# Patient Record
Sex: Female | Born: 1950
Health system: Southern US, Community
[De-identification: ages and names within clinical notes are randomized; demographics above are authoritative.]

## PROBLEM LIST (undated history)

## (undated) ENCOUNTER — Ambulatory Visit: Admission: EM

## (undated) DIAGNOSIS — M2550 Pain in unspecified joint: Secondary | ICD-10-CM

## (undated) DIAGNOSIS — M7072 Other bursitis of hip, left hip: Secondary | ICD-10-CM

## (undated) DIAGNOSIS — I10 Essential (primary) hypertension: Secondary | ICD-10-CM

## (undated) DIAGNOSIS — Z720 Tobacco use: Secondary | ICD-10-CM

## (undated) DIAGNOSIS — K219 Gastro-esophageal reflux disease without esophagitis: Secondary | ICD-10-CM

## (undated) DIAGNOSIS — E669 Obesity, unspecified: Secondary | ICD-10-CM

## (undated) DIAGNOSIS — J439 Emphysema, unspecified: Secondary | ICD-10-CM

## (undated) DIAGNOSIS — G4733 Obstructive sleep apnea (adult) (pediatric): Secondary | ICD-10-CM

## (undated) DIAGNOSIS — M8580 Other specified disorders of bone density and structure, unspecified site: Secondary | ICD-10-CM

## (undated) DIAGNOSIS — F32A Depression, unspecified: Secondary | ICD-10-CM

## (undated) DIAGNOSIS — N189 Chronic kidney disease, unspecified: Secondary | ICD-10-CM

## (undated) DIAGNOSIS — K5792 Diverticulitis of intestine, part unspecified, without perforation or abscess without bleeding: Secondary | ICD-10-CM

## (undated) DIAGNOSIS — F329 Major depressive disorder, single episode, unspecified: Secondary | ICD-10-CM

## (undated) DIAGNOSIS — IMO0001 Reserved for inherently not codable concepts without codable children: Secondary | ICD-10-CM

## (undated) DIAGNOSIS — R7303 Prediabetes: Secondary | ICD-10-CM

## (undated) DIAGNOSIS — M810 Age-related osteoporosis without current pathological fracture: Secondary | ICD-10-CM

## (undated) HISTORY — DX: Tobacco use: Z72.0

## (undated) HISTORY — DX: Obesity, unspecified: E66.9

## (undated) HISTORY — DX: Depression, unspecified: F32.A

## (undated) HISTORY — PX: ANAL FISSURE REPAIR: SHX2312

## (undated) HISTORY — PX: OOPHORECTOMY: SHX86

## (undated) HISTORY — DX: Diverticulitis of intestine, part unspecified, without perforation or abscess without bleeding: K57.92

## (undated) HISTORY — DX: Other bursitis of hip, left hip: M70.72

## (undated) HISTORY — DX: Gastro-esophageal reflux disease without esophagitis: K21.9

## (undated) HISTORY — PX: APPENDECTOMY: SHX54

## (undated) HISTORY — DX: Prediabetes: R73.03

## (undated) HISTORY — DX: Reserved for inherently not codable concepts without codable children: IMO0001

## (undated) HISTORY — DX: Obstructive sleep apnea (adult) (pediatric): G47.33

## (undated) HISTORY — DX: Pain in unspecified joint: M25.50

## (undated) HISTORY — DX: Essential (primary) hypertension: I10

## (undated) HISTORY — DX: Age-related osteoporosis without current pathological fracture: M81.0

## (undated) HISTORY — DX: Other specified disorders of bone density and structure, unspecified site: M85.80

## (undated) HISTORY — DX: Major depressive disorder, single episode, unspecified: F32.9

## (undated) HISTORY — DX: Emphysema, unspecified: J43.9

## (undated) SURGERY — Surgical Case
Anesthesia: *Unknown

---

## 2003-11-12 ENCOUNTER — Ambulatory Visit: Payer: Self-pay | Admitting: General Surgery

## 2006-09-12 ENCOUNTER — Emergency Department: Payer: Self-pay | Admitting: Emergency Medicine

## 2007-11-14 ENCOUNTER — Ambulatory Visit: Payer: Self-pay

## 2008-01-30 ENCOUNTER — Ambulatory Visit: Payer: Self-pay | Admitting: General Surgery

## 2008-01-30 LAB — HM COLONOSCOPY

## 2008-02-14 ENCOUNTER — Ambulatory Visit: Payer: Self-pay

## 2011-11-16 LAB — HM PAP SMEAR

## 2011-12-13 ENCOUNTER — Ambulatory Visit: Payer: Self-pay

## 2011-12-27 ENCOUNTER — Ambulatory Visit: Payer: Self-pay

## 2012-12-27 ENCOUNTER — Ambulatory Visit: Payer: Self-pay

## 2013-01-10 ENCOUNTER — Ambulatory Visit: Payer: Self-pay

## 2013-02-01 ENCOUNTER — Ambulatory Visit: Payer: Self-pay | Admitting: Family Medicine

## 2013-12-28 ENCOUNTER — Ambulatory Visit: Payer: Self-pay

## 2013-12-28 LAB — HM MAMMOGRAPHY

## 2014-08-06 DIAGNOSIS — Z720 Tobacco use: Secondary | ICD-10-CM | POA: Insufficient documentation

## 2014-08-06 DIAGNOSIS — K219 Gastro-esophageal reflux disease without esophagitis: Secondary | ICD-10-CM

## 2014-08-06 DIAGNOSIS — F32A Depression, unspecified: Secondary | ICD-10-CM | POA: Insufficient documentation

## 2014-08-06 DIAGNOSIS — M7072 Other bursitis of hip, left hip: Secondary | ICD-10-CM | POA: Insufficient documentation

## 2014-08-06 DIAGNOSIS — F329 Major depressive disorder, single episode, unspecified: Secondary | ICD-10-CM | POA: Insufficient documentation

## 2014-08-06 DIAGNOSIS — K5792 Diverticulitis of intestine, part unspecified, without perforation or abscess without bleeding: Secondary | ICD-10-CM | POA: Insufficient documentation

## 2014-08-06 DIAGNOSIS — M810 Age-related osteoporosis without current pathological fracture: Secondary | ICD-10-CM | POA: Insufficient documentation

## 2014-08-07 ENCOUNTER — Encounter: Payer: Self-pay | Admitting: Unknown Physician Specialty

## 2014-08-07 ENCOUNTER — Ambulatory Visit
Admission: RE | Admit: 2014-08-07 | Discharge: 2014-08-07 | Disposition: A | Discharge status: Home / Self Care | Payer: BC Managed Care – PPO | Source: Ambulatory Visit | Attending: Unknown Physician Specialty | Admitting: Unknown Physician Specialty

## 2014-08-07 ENCOUNTER — Ambulatory Visit (INDEPENDENT_AMBULATORY_CARE_PROVIDER_SITE_OTHER): Payer: BC Managed Care – PPO | Admitting: Unknown Physician Specialty

## 2014-08-07 VITALS — BP 137/80 | HR 69 | Temp 98.2°F | Ht 65.3 in | Wt 213.4 lb

## 2014-08-07 DIAGNOSIS — D170 Benign lipomatous neoplasm of skin and subcutaneous tissue of head, face and neck: Secondary | ICD-10-CM | POA: Diagnosis present

## 2014-08-07 DIAGNOSIS — T148 Other injury of unspecified body region: Secondary | ICD-10-CM | POA: Diagnosis not present

## 2014-08-07 DIAGNOSIS — T148XXA Other injury of unspecified body region, initial encounter: Secondary | ICD-10-CM

## 2014-08-07 MED ORDER — CYCLOBENZAPRINE HCL 10 MG PO TABS: 10.0000 mg | ORAL_TABLET | Freq: Three times a day (TID) | ORAL | Status: DC | PRN

## 2014-08-07 NOTE — Assessment & Plan Note (Addendum)
This feels to be a lipoma, but due to location further imaging is warranted.  Discussed with Dr. Sanda Klein, will order Ultrasound of the area to confirm.  Will get Chest x-ray as she is a smoker.

## 2014-08-07 NOTE — Progress Notes (Signed)
   BP 137/80 mmHg  Pulse 69  Temp(Src) 98.2 F (36.8 C)  Ht 5' 5.3" (1.659 m)  Wt 213 lb 6.4 oz (96.798 kg)  BMI 35.17 kg/m2  SpO2 100%  LMP  (LMP Unknown)   Subjective:    Patient ID: Allison Phillips, female    DOB: 1950/11/18, 64 y.o.   MRN: [id number]  HPI: Allison Phillips is a 64 y.o. female  Chief Complaint  Patient presents with  . Shoulder Pain    patient states she thinks she may have pulled a muscle in her shoulder about a week ago   Shoulder Pain  The pain is present in the neck and back. This is a new (as she was putting stuff on it, concerned about a lump ) problem. Episode onset: 1 1/2 weeks ago after lifting a heavy pan. The problem has been gradually improving. The quality of the pain is described as aching.  Pt states the muscle pain is getting better.  She is more concerned about the lump.    Relevant past medical, surgical, family and social history reviewed and updated as indicated. Interim medical history since our last visit reviewed. Allergies and medications reviewed and updated.  Review of Systems  Per HPI unless specifically indicated above     Objective:    BP 137/80 mmHg  Pulse 69  Temp(Src) 98.2 F (36.8 C)  Ht 5' 5.3" (1.659 m)  Wt 213 lb 6.4 oz (96.798 kg)  BMI 35.17 kg/m2  SpO2 100%  LMP  (LMP Unknown)  Wt Readings from Last 3 Encounters:  08/07/14 213 lb 6.4 oz (96.798 kg)  01/14/14 212 lb (96.163 kg)    Physical Exam  Constitutional: She is oriented to person, place, and time. She appears well-developed and well-nourished. No distress.  HENT:  Head: Normocephalic and atraumatic.  Eyes: Conjunctivae and lids are normal. Right eye exhibits no discharge. Left eye exhibits no discharge. No scleral icterus.  Pulmonary/Chest: Effort normal. No respiratory distress.  Abdominal: Normal appearance. There is no splenomegaly or hepatomegaly.  Musculoskeletal: Normal range of motion.  Noted soft 3-4 cm mass right clavicular area   Neurological: She is alert and oriented to person, place, and time.  Skin: Skin is intact. No rash noted. No pallor.  Psychiatric: She has a normal mood and affect. Her behavior is normal. Judgment and thought content normal.    No results found for this or any previous visit.    Assessment & Plan:   Problem List Items Addressed This Visit      Musculoskeletal and Integument   Lipoma of skin and subcutaneous tissue of neck - Primary    This feels to be a lipoma, but due to location further imaging is warranted.  Discussed with Dr. Sanda Klein, will order Ultrasound of the area to confirm.  Will get Chest x-ray as she is a smoker.        Relevant Orders   DG Chest 2 View   US Soft Tissue Head/Neck    Other Visit Diagnoses    Pulled muscle        Will rx Flexeril 10 mg q 8 hours prn        Follow up plan: Return if symptoms worsen or fail to improve, for results.

## 2014-08-09 ENCOUNTER — Ambulatory Visit
Admission: RE | Admit: 2014-08-09 | Discharge: 2014-08-09 | Disposition: A | Discharge status: Home / Self Care | Payer: BC Managed Care – PPO | Source: Ambulatory Visit | Attending: Unknown Physician Specialty | Admitting: Unknown Physician Specialty

## 2014-08-09 DIAGNOSIS — D170 Benign lipomatous neoplasm of skin and subcutaneous tissue of head, face and neck: Secondary | ICD-10-CM | POA: Diagnosis present

## 2014-11-25 ENCOUNTER — Encounter: Payer: Self-pay | Admitting: Unknown Physician Specialty

## 2014-11-25 ENCOUNTER — Ambulatory Visit (INDEPENDENT_AMBULATORY_CARE_PROVIDER_SITE_OTHER): Payer: BC Managed Care – PPO | Admitting: Unknown Physician Specialty

## 2014-11-25 VITALS — BP 136/82 | HR 86 | Temp 98.6°F | Ht 65.5 in | Wt 209.9 lb

## 2014-11-25 DIAGNOSIS — Z0000 Encounter for general adult medical examination without abnormal findings: Secondary | ICD-10-CM

## 2014-11-25 DIAGNOSIS — J40 Bronchitis, not specified as acute or chronic: Secondary | ICD-10-CM | POA: Diagnosis not present

## 2014-11-25 DIAGNOSIS — Z720 Tobacco use: Secondary | ICD-10-CM

## 2014-11-25 MED ORDER — AZITHROMYCIN 250 MG PO TABS: ORAL_TABLET | ORAL | Status: DC

## 2014-11-25 NOTE — Progress Notes (Signed)
BP 136/82 mmHg  Pulse 86  Temp(Src) 98.6 F (37 C)  Ht 5' 5.5" (1.664 m)  Wt 209 lb 14.4 oz (95.21 kg)  BMI 34.39 kg/m2  SpO2 97%  LMP  (LMP Unknown)   Subjective:    Patient ID: Allison Phillips, female    DOB: September 12, 1950, 64 y.o.   MRN: [id number]  HPI: Allison Phillips is a 64 y.o. female  Chief Complaint  Patient presents with  . Annual Exam  . Cough    pt states she has had a cough since last Tuesday night (11/19/14). States she has been coughing up "thick green stuff". States she tried Mucinex but it did not help   Cough This is a new problem. The current episode started in the past 7 days. The problem has been unchanged. The cough is productive of purulent sputum. Associated symptoms include wheezing. Pertinent negatives include no chest pain, chills, ear congestion, ear pain, fever, headaches, heartburn, hemoptysis, myalgias, nasal congestion, postnasal drip, rash, rhinorrhea, sore throat, shortness of breath, sweats or weight loss. Exacerbated by: worse at nigh\ Risk factors for lung disease include smoking/tobacco exposure. She has tried nothing for the symptoms.     Relevant past medical, surgical, family and social history reviewed and updated as indicated. Interim medical history since our last visit reviewed. Allergies and medications reviewed and updated.  Review of Systems  Constitutional: Negative for fever, chills and weight loss.  HENT: Negative for ear pain, postnasal drip, rhinorrhea and sore throat.   Respiratory: Positive for cough and wheezing. Negative for hemoptysis and shortness of breath.   Cardiovascular: Negative for chest pain.  Gastrointestinal: Negative for heartburn.  Musculoskeletal: Negative for myalgias.  Skin: Negative for rash.  Neurological: Negative for headaches.    Per HPI unless specifically indicated above     Objective:    BP 136/82 mmHg  Pulse 86  Temp(Src) 98.6 F (37 C)  Ht 5' 5.5" (1.664 m)  Wt 209 lb 14.4 oz  (95.21 kg)  BMI 34.39 kg/m2  SpO2 97%  LMP  (LMP Unknown)  Wt Readings from Last 3 Encounters:  11/25/14 209 lb 14.4 oz (95.21 kg)  08/07/14 213 lb 6.4 oz (96.798 kg)  01/14/14 212 lb (96.163 kg)    Physical Exam  Constitutional: She is oriented to person, place, and time. She appears well-developed and well-nourished.  HENT:  Head: Normocephalic and atraumatic.  Eyes: Pupils are equal, round, and reactive to light. Right eye exhibits no discharge. Left eye exhibits no discharge. No scleral icterus.  Neck: Normal range of motion. Neck supple. Carotid bruit is not present. No thyromegaly present.  Cardiovascular: Normal rate, regular rhythm and normal heart sounds.  Exam reveals no gallop and no friction rub.   No murmur heard. Pulmonary/Chest: Effort normal and breath sounds normal. No respiratory distress. She has no wheezes. She has no rales.  Abdominal: Soft. Bowel sounds are normal. There is no tenderness. There is no rebound.  Genitourinary: Vagina normal and uterus normal. No breast swelling, tenderness or discharge. Cervix exhibits no motion tenderness, no discharge and no friability. Right adnexum displays no mass, no tenderness and no fullness. Left adnexum displays no mass, no tenderness and no fullness.  Musculoskeletal: Normal range of motion.  Lymphadenopathy:    She has no cervical adenopathy.  Neurological: She is alert and oriented to person, place, and time.  Skin: Skin is warm, dry and intact. No rash noted.  Psychiatric: She has a normal mood and affect.  Her speech is normal and behavior is normal. Judgment and thought content normal. Cognition and memory are normal.  Nursing note and vitals reviewed.     Assessment & Plan:   Problem List Items Addressed This Visit    None    Visit Diagnoses    Annual physical exam    -  Primary    Relevant Orders    CBC with Differential/Platelet    Comprehensive metabolic panel    Hepatitis C antibody    HIV antibody     IGP, Aptima HPV, rfx 16/18,45    TSH    Lipid Panel w/o Chol/HDL Ratio    MM DIGITAL SCREENING BILATERAL    Ambulatory referral to General Surgery    Tobacco abuse        Bronchitis         Rx for Zithromax for Bronchitis   Follow up plan: Return in about 1 year (around 11/25/2015).

## 2014-11-26 ENCOUNTER — Encounter: Payer: Self-pay | Admitting: Unknown Physician Specialty

## 2014-11-26 LAB — COMPREHENSIVE METABOLIC PANEL
A/G RATIO: 1.7 (ref 1.1–2.5)
ALT: 14 IU/L (ref 0–32)
AST: 21 IU/L (ref 0–40)
Albumin: 4.5 g/dL (ref 3.6–4.8)
Alkaline Phosphatase: 58 IU/L (ref 39–117)
BUN/Creatinine Ratio: 14 (ref 11–26)
BUN: 12 mg/dL (ref 8–27)
Bilirubin Total: 0.4 mg/dL (ref 0.0–1.2)
CALCIUM: 9.5 mg/dL (ref 8.7–10.3)
CO2: 26 mmol/L (ref 18–29)
Chloride: 100 mmol/L (ref 97–106)
Creatinine, Ser: 0.86 mg/dL (ref 0.57–1.00)
GFR calc Af Amer: 83 mL/min/{1.73_m2} (ref >59)
GFR, EST NON AFRICAN AMERICAN: 72 mL/min/{1.73_m2} (ref >59)
Globulin, Total: 2.6 g/dL (ref 1.5–4.5)
Glucose: 109 mg/dL — ABNORMAL HIGH (ref 65–99)
POTASSIUM: 4.2 mmol/L (ref 3.5–5.2)
Sodium: 141 mmol/L (ref 136–144)
Total Protein: 7.1 g/dL (ref 6.0–8.5)

## 2014-11-26 LAB — CBC WITH DIFFERENTIAL/PLATELET
Basophils Absolute: 0.1 10*3/uL (ref 0.0–0.2)
Basos: 1 %
EOS (ABSOLUTE): 0.2 10*3/uL (ref 0.0–0.4)
Eos: 4 %
Hematocrit: 42.2 % (ref 34.0–46.6)
Hemoglobin: 13.7 g/dL (ref 11.1–15.9)
IMMATURE GRANULOCYTES: 0 %
Immature Grans (Abs): 0 10*3/uL (ref 0.0–0.1)
Lymphocytes Absolute: 2.7 10*3/uL (ref 0.7–3.1)
Lymphs: 44 %
MCH: 28.3 pg (ref 26.6–33.0)
MCHC: 32.5 g/dL (ref 31.5–35.7)
MCV: 87 fL (ref 79–97)
MONOS ABS: 0.3 10*3/uL (ref 0.1–0.9)
Monocytes: 6 %
NEUTROS PCT: 45 %
Neutrophils Absolute: 2.7 10*3/uL (ref 1.4–7.0)
PLATELETS: 307 10*3/uL (ref 150–379)
RBC: 4.84 x10E6/uL (ref 3.77–5.28)
RDW: 14.1 % (ref 12.3–15.4)
WBC: 6 10*3/uL (ref 3.4–10.8)

## 2014-11-26 LAB — TSH: TSH: 0.75 u[IU]/mL (ref 0.450–4.500)

## 2014-11-26 LAB — LIPID PANEL W/O CHOL/HDL RATIO
Cholesterol, Total: 152 mg/dL (ref 100–199)
HDL: 63 mg/dL (ref >39)
LDL Calculated: 58 mg/dL (ref 0–99)
Triglycerides: 154 mg/dL — ABNORMAL HIGH (ref 0–149)
VLDL Cholesterol Cal: 31 mg/dL (ref 5–40)

## 2014-11-26 LAB — HIV ANTIBODY (ROUTINE TESTING W REFLEX): HIV SCREEN 4TH GENERATION: NONREACTIVE

## 2014-11-26 LAB — HEPATITIS C ANTIBODY

## 2014-11-26 NOTE — Progress Notes (Signed)
Quick Note:  Normal labs. Patient notified by letter. ______ 

## 2014-11-30 LAB — IGP, APTIMA HPV, RFX 16/18,45
HPV APTIMA: NEGATIVE
PAP SMEAR COMMENT: 0

## 2014-12-05 ENCOUNTER — Encounter: Payer: Self-pay | Admitting: General Surgery

## 2014-12-05 ENCOUNTER — Ambulatory Visit (INDEPENDENT_AMBULATORY_CARE_PROVIDER_SITE_OTHER): Payer: BC Managed Care – PPO | Admitting: General Surgery

## 2014-12-05 VITALS — BP 118/68 | HR 74 | Resp 12 | Ht 66.0 in | Wt 219.0 lb

## 2014-12-05 DIAGNOSIS — K5790 Diverticulosis of intestine, part unspecified, without perforation or abscess without bleeding: Secondary | ICD-10-CM

## 2014-12-05 DIAGNOSIS — Z86010 Personal history of colonic polyps: Secondary | ICD-10-CM

## 2014-12-05 DIAGNOSIS — Z1211 Encounter for screening for malignant neoplasm of colon: Secondary | ICD-10-CM

## 2014-12-05 MED ORDER — POLYETHYLENE GLYCOL 3350 17 GM/SCOOP PO POWD: ORAL | Status: DC

## 2014-12-05 NOTE — Progress Notes (Signed)
Patient ID: Allison Phillips, female   DOB: Oct 31, 1950, 64 y.o.   MRN: [id number]  Chief Complaint  Patient presents with  . Colonoscopy    HPI Allison Phillips is a 64 y.o. female here today for an evaluation for a screening colonoscopy. Last one was 01/28/2008. Patient states no GI problems at this time.  No new health issues. Recent labs were normal. I have reviewed the history of present illness with the patient. HPI  Past Medical History  Diagnosis Date  . Reflux   . Depression   . Osteoporosis   . Diverticulitis   . Tobacco use   . Bursitis of left hip   . Obesity     Past Surgical History  Procedure Laterality Date  . Anal fissure repair    . Oophorectomy      for "fibroids" not sure which ovary  . Cesarean section  1978  . Appendectomy      Family History  Problem Relation Age of Onset  . Hypertension Mother   . Kidney disease Mother   . Alzheimer's disease Mother     Social History Social History  Substance Use Topics  . Smoking status: Current Every Day Smoker -- 0.50 packs/day    Types: Cigarettes  . Smokeless tobacco: Never Used  . Alcohol Use: Yes     Comment: on occasion    No Known Allergies  Current Outpatient Prescriptions  Medication Sig Dispense Refill  . Fish Oil-Cholecalciferol (FISH OIL + D3) 1000-1000 MG-UNIT CAPS Take by mouth.    . Multiple Vitamin (MULTIVITAMIN) tablet Take 1 tablet by mouth daily.    . vitamin B-12 (CYANOCOBALAMIN) 250 MCG tablet Take 250 mcg by mouth daily.    . vitamin E 200 UNIT capsule Take 200 Units by mouth daily.    . polyethylene glycol powder (GLYCOLAX/MIRALAX) powder 255 grams one bottle for colonoscopy prep 255 g 0   No current facility-administered medications for this visit.    Review of Systems Review of Systems  Constitutional: Negative.   Respiratory: Negative.   Cardiovascular: Negative.     Blood pressure 118/68, pulse 74, resp. rate 12, height 5\' 6"  (1.676 m), weight 219 lb (99.338  kg).  Physical Exam Physical Exam  Constitutional: She is oriented to person, place, and time. She appears well-developed and well-nourished.  Eyes: Conjunctivae are normal. No scleral icterus.  Neck: Neck supple.  Cardiovascular: Normal rate, regular rhythm and normal heart sounds.   Pulmonary/Chest: Effort normal and breath sounds normal.    Abdominal: Soft. Normal appearance and bowel sounds are normal. She exhibits no distension. There is no hepatomegaly. There is no tenderness.  Lymphadenopathy:    She has no cervical adenopathy.  Neurological: She is alert and oriented to person, place, and time.  Skin: Skin is dry.    Data Reviewed Notes and last colonoscopy reviewed  Assessment    Stable exam, history of colon polyps and diverticulosis. Due for repeat colonoscopy. She also has a small lipoma over right clavicular region, discussed options with patient (elective excision vs observation) and patient agreeable to observation at this time.     Plan       Colonoscopy with possible biopsy/polypectomy prn: Information regarding the procedure, including its potential risks and complications (including but not limited to perforation of the bowel, which may require emergency surgery to repair, and bleeding) was verbally given to the patient. Educational information regarding lower intestinal endoscopy was given to the patient. Written instructions for how to  complete the bowel prep using Miralax were provided. The importance of drinking ample fluids to avoid dehydration as a result of the prep emphasized.  Patient has been scheduled for a colonoscopy on 12-18-14 at [facility]. This patient has been asked to discontinue fish oil one week prior to procedure.   PCP:  Robbi Garter 12/05/2014, 10:32 AM

## 2014-12-05 NOTE — Addendum Note (Signed)
Addended by: Christene Lye on: 12/05/2014 10:35 AM   Modules accepted: Orders

## 2014-12-05 NOTE — Patient Instructions (Addendum)
Colonoscopy A colonoscopy is an exam to look at the entire large intestine (colon). This exam can help find problems such as tumors, polyps, inflammation, and areas of bleeding. The exam takes about 1 hour.  LET [facility] CARE PROVIDER KNOW ABOUT:   Any allergies you have.  All medicines you are taking, including vitamins, herbs, eye drops, creams, and over-the-counter medicines.  Previous problems you or members of your family have had with the use of anesthetics.  Any blood disorders you have.  Previous surgeries you have had.  Medical conditions you have. RISKS AND COMPLICATIONS  Generally, this is a safe procedure. However, as with any procedure, complications can occur. Possible complications include:  Bleeding.  Tearing or rupture of the colon wall.  Reaction to medicines given during the exam.  Infection (rare). BEFORE THE PROCEDURE   Ask your health care provider about changing or stopping your regular medicines.  You may be prescribed an oral bowel prep. This involves drinking a large amount of medicated liquid, starting the day before your procedure. The liquid will cause you to have multiple loose stools until your stool is almost clear or light green. This cleans out your colon in preparation for the procedure.  Do not eat or drink anything else once you have started the bowel prep, unless your health care provider tells you it is safe to do so.  Arrange for someone to drive you home after the procedure. PROCEDURE   You will be given medicine to help you relax (sedative).  You will lie on your side with your knees bent.  A long, flexible tube with a light and camera on the end (colonoscope) will be inserted through the rectum and into the colon. The camera sends video back to a computer screen as it moves through the colon. The colonoscope also releases carbon dioxide gas to inflate the colon. This helps your health care provider see the area better.  During  the exam, your health care provider may take a small tissue sample (biopsy) to be examined under a microscope if any abnormalities are found.  The exam is finished when the entire colon has been viewed. AFTER THE PROCEDURE   Do not drive for 24 hours after the exam.  You may have a small amount of blood in your stool.  You may pass moderate amounts of gas and have mild abdominal cramping or bloating. This is caused by the gas used to inflate your colon during the exam.  Ask when your test results will be ready and how you will get your results. Make sure you get your test results.   This information is not intended to replace advice given to you by your health care provider. Make sure you discuss any questions you have with your health care provider.   Document Released: 01/02/2000 Document Revised: 10/25/2012 Document Reviewed: 09/11/2012 Elsevier Interactive Patient Education [employer].  Patient has been scheduled for a colonoscopy on 12-18-14 at [facility]. This patient has been asked to discontinue fish oil one week prior to procedure.

## 2014-12-17 ENCOUNTER — Ambulatory Visit: Payer: BC Managed Care – PPO | Admitting: Unknown Physician Specialty

## 2014-12-18 ENCOUNTER — Encounter: Payer: Self-pay | Admitting: Anesthesiology

## 2014-12-18 ENCOUNTER — Ambulatory Visit: Payer: BC Managed Care – PPO | Admitting: Anesthesiology

## 2014-12-18 ENCOUNTER — Encounter
Admission: RE | Disposition: A | Discharge status: Home / Self Care | Payer: Self-pay | Source: Ambulatory Visit | Attending: General Surgery

## 2014-12-18 ENCOUNTER — Ambulatory Visit
Admission: RE | Admit: 2014-12-18 | Discharge: 2014-12-18 | Disposition: A | Discharge status: Home / Self Care | Payer: BC Managed Care – PPO | Source: Ambulatory Visit | Attending: General Surgery | Admitting: General Surgery

## 2014-12-18 DIAGNOSIS — F329 Major depressive disorder, single episode, unspecified: Secondary | ICD-10-CM | POA: Diagnosis not present

## 2014-12-18 DIAGNOSIS — Z841 Family history of disorders of kidney and ureter: Secondary | ICD-10-CM | POA: Diagnosis not present

## 2014-12-18 DIAGNOSIS — Z86010 Personal history of colonic polyps: Secondary | ICD-10-CM | POA: Insufficient documentation

## 2014-12-18 DIAGNOSIS — E669 Obesity, unspecified: Secondary | ICD-10-CM | POA: Insufficient documentation

## 2014-12-18 DIAGNOSIS — Z820 Family history of epilepsy and other diseases of the nervous system: Secondary | ICD-10-CM | POA: Insufficient documentation

## 2014-12-18 DIAGNOSIS — F17210 Nicotine dependence, cigarettes, uncomplicated: Secondary | ICD-10-CM | POA: Insufficient documentation

## 2014-12-18 DIAGNOSIS — Z79899 Other long term (current) drug therapy: Secondary | ICD-10-CM | POA: Insufficient documentation

## 2014-12-18 DIAGNOSIS — K5730 Diverticulosis of large intestine without perforation or abscess without bleeding: Secondary | ICD-10-CM | POA: Diagnosis not present

## 2014-12-18 DIAGNOSIS — G4730 Sleep apnea, unspecified: Secondary | ICD-10-CM | POA: Insufficient documentation

## 2014-12-18 DIAGNOSIS — M810 Age-related osteoporosis without current pathological fracture: Secondary | ICD-10-CM | POA: Insufficient documentation

## 2014-12-18 DIAGNOSIS — K219 Gastro-esophageal reflux disease without esophagitis: Secondary | ICD-10-CM | POA: Diagnosis not present

## 2014-12-18 DIAGNOSIS — K621 Rectal polyp: Secondary | ICD-10-CM | POA: Diagnosis not present

## 2014-12-18 DIAGNOSIS — Z8249 Family history of ischemic heart disease and other diseases of the circulatory system: Secondary | ICD-10-CM | POA: Insufficient documentation

## 2014-12-18 HISTORY — PX: COLONOSCOPY WITH PROPOFOL: SHX5780

## 2014-12-18 SURGERY — COLONOSCOPY WITH PROPOFOL
Anesthesia: General

## 2014-12-18 MED ORDER — SODIUM CHLORIDE 0.9 % IV SOLN
INTRAVENOUS | Status: DC
  Administered 2014-12-18: 09:00:00 via INTRAVENOUS

## 2014-12-18 MED ORDER — FENTANYL CITRATE (PF) 100 MCG/2ML IJ SOLN
INTRAMUSCULAR | Status: DC | PRN
  Administered 2014-12-18: 50 ug via INTRAVENOUS

## 2014-12-18 MED ORDER — PROPOFOL 10 MG/ML IV BOLUS
INTRAVENOUS | Status: DC | PRN
  Administered 2014-12-18: 50 mg via INTRAVENOUS

## 2014-12-18 MED ORDER — MIDAZOLAM HCL 2 MG/2ML IJ SOLN
INTRAMUSCULAR | Status: DC | PRN
  Administered 2014-12-18: 1 mg via INTRAVENOUS

## 2014-12-18 MED ORDER — PROPOFOL 500 MG/50ML IV EMUL
INTRAVENOUS | Status: DC | PRN
  Administered 2014-12-18: 125 ug/kg/min via INTRAVENOUS

## 2014-12-18 NOTE — Transfer of Care (Signed)
Immediate Anesthesia Transfer of Care Note  Patient: Allison Phillips  Procedure(s) Performed: Procedure(s): COLONOSCOPY WITH PROPOFOL (N/A)  Patient Location: PACU  Anesthesia Type:General  Level of Consciousness: sedated  Airway & Oxygen Therapy: Patient Spontanous Breathing and Patient connected to nasal cannula oxygen  Post-op Assessment: Report given to RN and Post -op Vital signs reviewed and stable  Post vital signs: Reviewed and stable  Last Vitals:  Filed Vitals:   12/18/14 0915  BP: 138/75  Pulse: 62  Temp: 35.9 C  Resp: 17    Complications: No apparent anesthesia complications

## 2014-12-18 NOTE — Op Note (Signed)
[facility] Patient Name: Allison Phillips Procedure Date: 12/18/2014 9:38 AM MRN: [id number] Account #: [id number] Date of Birth: 07-06-1950 Admit Type: Outpatient Age: 64 Room: [facility] ENDO ROOM 1 Gender: Female Note Status: Finalized Procedure:         Colonoscopy Indications:       High risk colon cancer surveillance: Personal history of                     colonic polyps Providers:         Kashae Carstens G. Jamal Collin, MD Referring MD:      Forest Gleason Md, MD (Referring MD) Medicines:         General Anesthesia Complications:     No immediate complications. Procedure:         Pre-Anesthesia Assessment:                    - General anesthesia under the supervision of an                     anesthesiologist was determined to be medically necessary                     for this procedure based on review of the patient's                     medical history, medications, and prior anesthesia history.                    After obtaining informed consent, the colonoscope was                     passed under direct vision. Throughout the procedure, the                     patient's blood pressure, pulse, and oxygen saturations                     were monitored continuously. The Colonoscope was                     introduced through the anus and advanced to the the cecum,                     identified by the ileocecal valve. The colonoscopy was                     performed without difficulty. The patient tolerated the                     procedure well. The quality of the bowel preparation was                     good. Findings:      The perianal and digital rectal examinations were normal.      Multiple small and large-mouthed diverticula were found in the sigmoid       colon.      Many sessile polyps were found in the rectum. The polyps were 2 to 3 mm       in size. Biopsies were taken with a cold forceps for histology.      The exam was otherwise without  abnormality. Impression:        - Diverticulosis in the sigmoid colon.                    -  Many benign appearing 2 to 3 mm polyps in the rectum.                     Biopsied.                    - The examination was otherwise normal. Recommendation:    - Repeat colonoscopy in 5 years for surveillance. Procedure Code(s): --- Professional ---                    [phone], Colonoscopy, flexible; with biopsy, single or                     multiple Diagnosis Code(s): --- Professional ---                    Z86.010, Personal history of colonic polyps                    K62.1, Rectal polyp                    K57.30, Diverticulosis of large intestine without                     perforation or abscess without bleeding CPT copyright 2014 American Medical Association. All rights reserved. The codes documented in this report are preliminary and upon coder review may  be revised to meet current compliance requirements. Christene Lye, MD 12/18/2014 10:06:42 AM This report has been signed electronically. Number of Addenda: 0 Note Initiated On: 12/18/2014 9:38 AM Scope Withdrawal Time: 0 hours 6 minutes 25 seconds  Total Procedure Duration: 0 hours 19 minutes 25 seconds       [facility]

## 2014-12-18 NOTE — Anesthesia Procedure Notes (Signed)
Date/Time: 12/18/2014 9:49 AM Performed by: Nelda Marseille Pre-anesthesia Checklist: Patient identified, Emergency Drugs available, Suction available, Patient being monitored and Timeout performed Oxygen Delivery Method: Nasal cannula

## 2014-12-18 NOTE — Interval H&P Note (Signed)
History and Physical Interval Note:  12/18/2014 9:26 AM  Allison Phillips  has presented today for surgery, with the diagnosis of HX COLON POLYPS  The various methods of treatment have been discussed with the patient and family. After consideration of risks, benefits and other options for treatment, the patient has consented to  Procedure(s): COLONOSCOPY WITH PROPOFOL (N/A) as a surgical intervention .  The patient's history has been reviewed, patient examined, no change in status, stable for surgery.  I have reviewed the patient's chart and labs.  Questions were answered to the patient's satisfaction.     SANKAR,SEEPLAPUTHUR G

## 2014-12-18 NOTE — Anesthesia Preprocedure Evaluation (Addendum)
Anesthesia Evaluation  Patient identified by MRN, date of birth, ID band Patient awake    Reviewed: Allergy & Precautions, NPO status , Patient's Chart, lab work & pertinent test results, reviewed documented beta blocker date and time   Airway Mallampati: II  TM Distance: >3 FB     Dental  (+) Chipped   Pulmonary sleep apnea , Current Smoker,           Cardiovascular      Neuro/Psych Depression    GI/Hepatic   Endo/Other    Renal/GU      Musculoskeletal   Abdominal   Peds  Hematology   Anesthesia Other Findings Has sleep apnea - no CPAP.Obese.  Reproductive/Obstetrics                            Anesthesia Physical Anesthesia Plan  ASA: II  Anesthesia Plan: General   Post-op Pain Management:    Induction: Intravenous  Airway Management Planned: Nasal Cannula  Additional Equipment:   Intra-op Plan:   Post-operative Plan:   Informed Consent: I have reviewed the patients History and Physical, chart, labs and discussed the procedure including the risks, benefits and alternatives for the proposed anesthesia with the patient or authorized representative who has indicated his/her understanding and acceptance.     Plan Discussed with: CRNA  Anesthesia Plan Comments:         Anesthesia Quick Evaluation

## 2014-12-18 NOTE — Anesthesia Postprocedure Evaluation (Signed)
Anesthesia Post Note  Patient: Allison Phillips  Procedure(s) Performed: Procedure(s) (LRB): COLONOSCOPY WITH PROPOFOL (N/A)  Patient location during evaluation: PACU Anesthesia Type: General Level of consciousness: awake Pain management: pain level controlled Vital Signs Assessment: post-procedure vital signs reviewed and stable Respiratory status: spontaneous breathing Cardiovascular status: blood pressure returned to baseline Anesthetic complications: no    Last Vitals:  Filed Vitals:   12/18/14 1028 12/18/14 1038  BP: 130/78 134/77  Pulse: 58 52  Temp:    Resp: 16 15    Last Pain: There were no vitals filed for this visit.               [name] S

## 2014-12-18 NOTE — H&P (View-Only) (Signed)
Patient ID: Allison Phillips, female   DOB: [birth year], 64 y.o.   MRN: [id number]  Chief Complaint  Patient presents with  . Colonoscopy    HPI Allison Phillips is a 64 y.o. female here today for an evaluation for a screening colonoscopy. Last one was 01/28/2008. Patient states no GI problems at this time.  No new health issues. Recent labs were normal. I have reviewed the history of present illness with the patient. HPI  Past Medical History  Diagnosis Date  . Reflux   . Depression   . Osteoporosis   . Diverticulitis   . Tobacco use   . Bursitis of left hip   . Obesity     Past Surgical History  Procedure Laterality Date  . Anal fissure repair    . Oophorectomy      for "fibroids" not sure which ovary  . Cesarean section  1978  . Appendectomy      Family History  Problem Relation Age of Onset  . Hypertension Mother   . Kidney disease Mother   . Alzheimer's disease Mother     Social History Social History  Substance Use Topics  . Smoking status: Current Every Day Smoker -- 0.50 packs/day    Types: Cigarettes  . Smokeless tobacco: Never Used  . Alcohol Use: Yes     Comment: on occasion    No Known Allergies  Current Outpatient Prescriptions  Medication Sig Dispense Refill  . Fish Oil-Cholecalciferol (FISH OIL + D3) 1000-1000 MG-UNIT CAPS Take by mouth.    . Multiple Vitamin (MULTIVITAMIN) tablet Take 1 tablet by mouth daily.    . vitamin B-12 (CYANOCOBALAMIN) 250 MCG tablet Take 250 mcg by mouth daily.    . vitamin E 200 UNIT capsule Take 200 Units by mouth daily.    . polyethylene glycol powder (GLYCOLAX/MIRALAX) powder 255 grams one bottle for colonoscopy prep 255 g 0   No current facility-administered medications for this visit.    Review of Systems Review of Systems  Constitutional: Negative.   Respiratory: Negative.   Cardiovascular: Negative.     Blood pressure 118/68, pulse 74, resp. rate 12, height 5\' 6"  (1.676 m), weight 219 lb (99.338  kg).  Physical Exam Physical Exam  Constitutional: She is oriented to person, place, and time. She appears well-developed and well-nourished.  Eyes: Conjunctivae are normal. No scleral icterus.  Neck: Neck supple.  Cardiovascular: Normal rate, regular rhythm and normal heart sounds.   Pulmonary/Chest: Effort normal and breath sounds normal.    Abdominal: Soft. Normal appearance and bowel sounds are normal. She exhibits no distension. There is no hepatomegaly. There is no tenderness.  Lymphadenopathy:    She has no cervical adenopathy.  Neurological: She is alert and oriented to person, place, and time.  Skin: Skin is dry.    Data Reviewed Notes and last colonoscopy reviewed  Assessment    Stable exam, history of colon polyps and diverticulosis. Due for repeat colonoscopy. She also has a small lipoma over right clavicular region, discussed options with patient (elective excision vs observation) and patient agreeable to observation at this time.     Plan       Colonoscopy with possible biopsy/polypectomy prn: Information regarding the procedure, including its potential risks and complications (including but not limited to perforation of the bowel, which may require emergency surgery to repair, and bleeding) was verbally given to the patient. Educational information regarding lower intestinal endoscopy was given to the patient. Written instructions for how to  complete the bowel prep using Miralax were provided. The importance of drinking ample fluids to avoid dehydration as a result of the prep emphasized.  Patient has been scheduled for a colonoscopy on 12-18-14 at [facility]. This patient has been asked to discontinue fish oil one week prior to procedure.   PCP:  Robbi Garter 12/05/2014, 10:32 AM

## 2014-12-20 LAB — SURGICAL PATHOLOGY

## 2014-12-23 ENCOUNTER — Encounter: Payer: Self-pay | Admitting: General Surgery

## 2014-12-24 ENCOUNTER — Ambulatory Visit (INDEPENDENT_AMBULATORY_CARE_PROVIDER_SITE_OTHER): Payer: BC Managed Care – PPO | Admitting: Unknown Physician Specialty

## 2014-12-24 ENCOUNTER — Telehealth: Payer: Self-pay | Admitting: *Deleted

## 2014-12-24 ENCOUNTER — Encounter: Payer: Self-pay | Admitting: Unknown Physician Specialty

## 2014-12-24 VITALS — BP 134/78 | HR 75 | Temp 98.5°F | Ht 66.5 in | Wt 214.8 lb

## 2014-12-24 DIAGNOSIS — M62838 Other muscle spasm: Secondary | ICD-10-CM | POA: Diagnosis not present

## 2014-12-24 DIAGNOSIS — M542 Cervicalgia: Secondary | ICD-10-CM

## 2014-12-24 DIAGNOSIS — M259 Joint disorder, unspecified: Secondary | ICD-10-CM

## 2014-12-24 DIAGNOSIS — R2230 Localized swelling, mass and lump, unspecified upper limb: Secondary | ICD-10-CM

## 2014-12-24 MED ORDER — MELOXICAM 15 MG PO TABS: 15.0000 mg | ORAL_TABLET | Freq: Every day | ORAL | Status: DC

## 2014-12-24 NOTE — Telephone Encounter (Signed)
-----   Message from Christene Lye, MD sent at 12/24/2014  8:12 AM EST ----- Please let pt pt know the pathology was normal.

## 2014-12-24 NOTE — Progress Notes (Signed)
   BP 134/78 mmHg  Pulse 75  Temp(Src) 98.5 F (36.9 C)  Ht 5' 6.5" (1.689 m)  Wt 214 lb 12.8 oz (97.433 kg)  BMI 34.15 kg/m2  SpO2 97%  LMP  (LMP Unknown)   Subjective:    Patient ID: Allison Phillips, female    DOB: [birth year], 64 y.o.   MRN: [id number]  HPI: Allison Phillips is a 64 y.o. female  Chief Complaint  Patient presents with  . Shoulder Pain    pt states she has been having right shoulder pain that runs into her neck. States the pain started back in July and has gotten worse lately. States she has a lump on shoulder and wonders if that could be causing the neck pain.   Shoulder pain Pt is complaining of right shoulder pain since July.  It is gradually worsening.  Horrible night time pain.  Cannot lay on that side.  Takes 2 Advil in the AM just to "get going."  Feels a pop or crackle every so often with a feeling of relief.   Had an [place] of a Lipoma on the right shoulder which was consistant with a Lipoma.  She doesn't think the area has changed.  The pain never completely went away from July but worse over the last 2 weeks.    Relevant past medical, surgical, family and social history reviewed and updated as indicated. Interim medical history since our last visit reviewed. Allergies and medications reviewed and updated.  Review of Systems  Per HPI unless specifically indicated above     Objective:    BP 134/78 mmHg  Pulse 75  Temp(Src) 98.5 F (36.9 C)  Ht 5' 6.5" (1.689 m)  Wt 214 lb 12.8 oz (97.433 kg)  BMI 34.15 kg/m2  SpO2 97%  LMP  (LMP Unknown)  Wt Readings from Last 3 Encounters:  12/24/14 214 lb 12.8 oz (97.433 kg)  12/05/14 219 lb (99.338 kg)  11/25/14 209 lb 14.4 oz (95.21 kg)    Physical Exam  Constitutional: She is oriented to person, place, and time. She appears well-developed and well-nourished. No distress.  HENT:  Head: Normocephalic and atraumatic.  Eyes: Conjunctivae and lids are normal. Right eye exhibits no discharge. Left eye exhibits  no discharge. No scleral icterus.  Cardiovascular: Normal rate.   Pulmonary/Chest: Effort normal.  Abdominal: Normal appearance. There is no splenomegaly or hepatomegaly.  Musculoskeletal: Normal range of motion.  Soft mass on top of right shoulder. Appreciate spasm of right paravetebral neck muculature  Neurological: She is alert and oriented to person, place, and time.  Skin: Skin is intact. No rash noted. No pallor.  Psychiatric: She has a normal mood and affect. Her behavior is normal. Judgment and thought content normal.      Assessment & Plan:   Problem List Items Addressed This Visit    None    Visit Diagnoses    Neck pain    -  Primary    Will get x-ray.  Has Flexeril at home.  Rx for higher dose NSAIDS    Relevant Orders    MR Neck Soft Tissue W Contrast    Mass of shoulder region        Order MRI.  [place] consistant with lipoma    Muscle spasm        Has flexeril at home        Follow up plan: Return in about 1 week (around 12/31/2014).

## 2014-12-24 NOTE — Telephone Encounter (Signed)
Patient called back and is aware of pathology results and was very pleased.

## 2014-12-25 ENCOUNTER — Other Ambulatory Visit: Payer: Self-pay | Admitting: Unknown Physician Specialty

## 2014-12-25 ENCOUNTER — Ambulatory Visit
Admission: RE | Admit: 2014-12-25 | Discharge: 2014-12-25 | Disposition: A | Discharge status: Home / Self Care | Payer: BC Managed Care – PPO | Source: Ambulatory Visit | Attending: Unknown Physician Specialty | Admitting: Unknown Physician Specialty

## 2014-12-25 DIAGNOSIS — M47892 Other spondylosis, cervical region: Secondary | ICD-10-CM | POA: Diagnosis not present

## 2014-12-25 DIAGNOSIS — M542 Cervicalgia: Secondary | ICD-10-CM

## 2014-12-30 ENCOUNTER — Other Ambulatory Visit: Payer: Self-pay | Admitting: Unknown Physician Specialty

## 2014-12-30 DIAGNOSIS — IMO0002 Reserved for concepts with insufficient information to code with codable children: Secondary | ICD-10-CM

## 2014-12-30 DIAGNOSIS — R229 Localized swelling, mass and lump, unspecified: Principal | ICD-10-CM

## 2014-12-31 ENCOUNTER — Ambulatory Visit (INDEPENDENT_AMBULATORY_CARE_PROVIDER_SITE_OTHER): Payer: BC Managed Care – PPO | Admitting: Unknown Physician Specialty

## 2014-12-31 ENCOUNTER — Ambulatory Visit
Admission: RE | Admit: 2014-12-31 | Discharge: 2014-12-31 | Disposition: A | Discharge status: Home / Self Care | Payer: BC Managed Care – PPO | Source: Ambulatory Visit | Attending: Unknown Physician Specialty | Admitting: Unknown Physician Specialty

## 2014-12-31 ENCOUNTER — Encounter: Payer: Self-pay | Admitting: Unknown Physician Specialty

## 2014-12-31 VITALS — BP 123/83 | HR 64 | Temp 98.3°F | Wt 213.2 lb

## 2014-12-31 DIAGNOSIS — D170 Benign lipomatous neoplasm of skin and subcutaneous tissue of head, face and neck: Secondary | ICD-10-CM | POA: Diagnosis not present

## 2014-12-31 DIAGNOSIS — M542 Cervicalgia: Secondary | ICD-10-CM | POA: Diagnosis not present

## 2014-12-31 DIAGNOSIS — Z1231 Encounter for screening mammogram for malignant neoplasm of breast: Secondary | ICD-10-CM | POA: Diagnosis not present

## 2014-12-31 DIAGNOSIS — Z0000 Encounter for general adult medical examination without abnormal findings: Secondary | ICD-10-CM

## 2014-12-31 NOTE — Progress Notes (Signed)
   BP 123/83 mmHg  Pulse 64  Temp(Src) 98.3 F (36.8 C)  Wt 213 lb 3.2 oz (96.707 kg)  SpO2 98%  LMP  (LMP Unknown)   Subjective:    Patient ID: Allison Phillips, female    DOB: Jan 17, 1951, 64 y.o.   MRN: [id number]  HPI: Allison Phillips is a 64 y.o. female  Chief Complaint  Patient presents with  . Shoulder Pain    pt was seen last week for shoulder painand was given Meloxicam. States the meloxicam helps but makes her very drowsy.    Pt got a neck x-ray was normal.  Shoulder still painful.  Pending a MRI of soft tissue to evaluate an assumed (by [place]) Lipoma if right shoulder but found it would be $1300 and pt would like another option.    Relevant past medical, surgical, family and social history reviewed and updated as indicated. Interim medical history since our last visit reviewed. Allergies and medications reviewed and updated.  Review of Systems  Per HPI unless specifically indicated above     Objective:    BP 123/83 mmHg  Pulse 64  Temp(Src) 98.3 F (36.8 C)  Wt 213 lb 3.2 oz (96.707 kg)  SpO2 98%  LMP  (LMP Unknown)  Wt Readings from Last 3 Encounters:  12/31/14 213 lb 3.2 oz (96.707 kg)  12/24/14 214 lb 12.8 oz (97.433 kg)  12/05/14 219 lb (99.338 kg)    Physical Exam  Constitutional: She is oriented to person, place, and time. She appears well-developed and well-nourished. No distress.  HENT:  Head: Normocephalic and atraumatic.  Eyes: Conjunctivae and lids are normal. Right eye exhibits no discharge. Left eye exhibits no discharge. No scleral icterus.  Cardiovascular: Normal rate.   Pulmonary/Chest: Effort normal.  Abdominal: Normal appearance. There is no splenomegaly or hepatomegaly.  Musculoskeletal: Normal range of motion.  Continued very tender right musculatrue and prominent mass (evaluated by [place] and consistant with a lipoma) on top of right shoulder  Neurological: She is alert and oriented to person, place, and time.  Skin: Skin is intact. No  rash noted. No pallor.  Psychiatric: She has a normal mood and affect. Her behavior is normal. Judgment and thought content normal.     Assessment & Plan:   Problem List Items Addressed This Visit      Unprioritized   Lipoma of skin and subcutaneous tissue of neck - Primary    Evaluated by [place] and consistent with a Lipoma.  I am wondering if it needs further evaluation and pt unable to get the MRI due to cost.  Will refer to general surgery for further evaluation of mass.       Relevant Orders   Ambulatory referral to General Surgery   Cervical pain (neck)    ? related only to spasm or is it related or contributed to by the lipoma.  Getting a confirmatory diagnosis of presumed mass will help      Relevant Orders   Ambulatory referral to General Surgery      I cannot cancel  MRI but pt will call and let them know she is not coming.   Follow up plan: Return if symptoms worsen or fail to improve.

## 2014-12-31 NOTE — Assessment & Plan Note (Addendum)
Evaluated by [place] and consistent with a Lipoma.  I am wondering if it needs further evaluation and pt unable to get the MRI due to cost.  Will refer to general surgery for further evaluation of mass.

## 2014-12-31 NOTE — Assessment & Plan Note (Addendum)
?   related only to spasm or is it related or contributed to by the lipoma.  Getting a confirmatory diagnosis of presumed mass will help

## 2015-01-06 ENCOUNTER — Ambulatory Visit (INDEPENDENT_AMBULATORY_CARE_PROVIDER_SITE_OTHER): Payer: BC Managed Care – PPO | Admitting: General Surgery

## 2015-01-06 ENCOUNTER — Encounter: Payer: Self-pay | Admitting: General Surgery

## 2015-01-06 VITALS — BP 132/76 | HR 70 | Resp 16 | Ht 66.0 in | Wt 215.0 lb

## 2015-01-06 DIAGNOSIS — M542 Cervicalgia: Secondary | ICD-10-CM

## 2015-01-06 DIAGNOSIS — D170 Benign lipomatous neoplasm of skin and subcutaneous tissue of head, face and neck: Secondary | ICD-10-CM

## 2015-01-06 NOTE — Progress Notes (Signed)
Patient ID: Allison Phillips, female   DOB: 12/20/1950, 64 y.o.   MRN: [id number]  Chief Complaint  Patient presents with  . Lipoma    neck    HPI Allison Phillips is a 64 y.o. female here today for an evaluation of a lipoma on her neck. She first noticed some pain along the top of right shoulder area in July when she was lifting a heavy pan. Later she noted a small lump near top of right shoulder. She wants to make sure the lipoma is not pushing on her muscle causing the pain. She had an xray of C spine and ultrasound on 12/31/14. Was scheduled for MRI but she has not had this yet. Pain goes up to back of her neck and occasionally to rt upper arm. I have reviewed the history of present illness with the patient. HPI  Past Medical History  Diagnosis Date  . Reflux   . Depression   . Osteoporosis   . Diverticulitis   . Tobacco use   . Bursitis of left hip   . Obesity     Past Surgical History  Procedure Laterality Date  . Anal fissure repair    . Oophorectomy      for "fibroids" not sure which ovary  . Cesarean section  1978  . Appendectomy    . Colonoscopy with propofol N/A 12/18/2014    Procedure: COLONOSCOPY WITH PROPOFOL;  Surgeon: Christene Lye, MD;  Location: [facility] ENDOSCOPY;  Service: Endoscopy;  Laterality: N/A;    Family History  Problem Relation Age of Onset  . Hypertension Mother   . Kidney disease Mother   . Alzheimer's disease Mother     Social History Social History  Substance Use Topics  . Smoking status: Current Every Day Smoker -- 0.50 packs/day    Types: Cigarettes  . Smokeless tobacco: Never Used  . Alcohol Use: Yes     Comment: on occasion    No Known Allergies  Current Outpatient Prescriptions  Medication Sig Dispense Refill  . meloxicam (MOBIC) 15 MG tablet Take 1 tablet (15 mg total) by mouth daily. 30 tablet 0  . Multiple Vitamin (MULTIVITAMIN) tablet Take 1 tablet by mouth daily.    . Fish Oil-Cholecalciferol (FISH OIL + D3)  1000-1000 MG-UNIT CAPS Take by mouth. Reported on 01/06/2015    . vitamin B-12 (CYANOCOBALAMIN) 250 MCG tablet Take 250 mcg by mouth daily. Reported on 01/06/2015    . vitamin E 200 UNIT capsule Take 200 Units by mouth daily. Reported on 01/06/2015     No current facility-administered medications for this visit.    Review of Systems Review of Systems  Constitutional: Negative.   Respiratory: Negative.   Cardiovascular: Negative.     Blood pressure 132/76, pulse 70, resp. rate 16, height 5\' 6"  (1.676 m), weight 215 lb (97.523 kg).  Physical Exam Physical Exam  Constitutional: She is oriented to person, place, and time. She appears well-developed and well-nourished.  Neck: Neck supple.  Lymphadenopathy:    She has no cervical adenopathy.    She has no axillary adenopathy.  Neurological: She is alert and oriented to person, place, and time.  Skin: Skin is warm and dry.       Data Reviewed US-showing a lipoma 2.3 cm C=-spine showing some DJD Assessment    Neck pain on right- not sure if the lipoma is causing this.    Plan   Discussed excision right neck mass if the MRI fails to reveal  other cause for her pain. This can be done in office.  She will get her MRI and let us know.   This information has been scribed by Verlene Mayer, [place]    PCP: [name], PA         Christene Lye 01/06/2015, 2:06 PM

## 2015-01-06 NOTE — Patient Instructions (Addendum)
Discussed excision neck mass. Patient to follow up as needed.

## 2015-01-16 ENCOUNTER — Ambulatory Visit: Payer: BC Managed Care – PPO

## 2015-01-27 ENCOUNTER — Other Ambulatory Visit: Payer: Self-pay | Admitting: Unknown Physician Specialty

## 2015-01-27 ENCOUNTER — Ambulatory Visit
Admission: RE | Admit: 2015-01-27 | Discharge: 2015-01-27 | Disposition: A | Discharge status: Home / Self Care | Payer: BC Managed Care – PPO | Source: Ambulatory Visit | Attending: Unknown Physician Specialty | Admitting: Unknown Physician Specialty

## 2015-01-27 DIAGNOSIS — R229 Localized swelling, mass and lump, unspecified: Secondary | ICD-10-CM | POA: Insufficient documentation

## 2015-01-27 DIAGNOSIS — IMO0002 Reserved for concepts with insufficient information to code with codable children: Secondary | ICD-10-CM

## 2015-01-27 MED ORDER — GADOBENATE DIMEGLUMINE 529 MG/ML IV SOLN
20.0000 mL | Freq: Once | INTRAVENOUS | Status: AC | PRN
  Administered 2015-01-27: 20 mL via INTRAVENOUS

## 2015-01-28 ENCOUNTER — Telehealth: Payer: Self-pay | Admitting: General Surgery

## 2015-01-28 DIAGNOSIS — M25511 Pain in right shoulder: Secondary | ICD-10-CM

## 2015-01-28 NOTE — Telephone Encounter (Signed)
PT  CALLED TODAY& WANTED TO KNOW IF YOU HAD SEEN HER HEAD/ST NECK MRI  W/WO CONTRAST DONE ON 01-27-15& ORDERED BY CHERYL WICKER NP.(DONE @ [facility])SHE WOULD LIKE TO KNOW YOUR OPINION.WAITING FOR FUTHER INSTRUCTIONS.PLEASE CALL PT @ HER H# [phone]. SHE WILL CALL 01-29-15 BETWEEN 3-4 IF SHE HAS NOT HEARD BACK FROM OUR OFC.

## 2015-01-29 ENCOUNTER — Ambulatory Visit (INDEPENDENT_AMBULATORY_CARE_PROVIDER_SITE_OTHER): Payer: BC Managed Care – PPO | Admitting: Unknown Physician Specialty

## 2015-01-29 ENCOUNTER — Encounter: Payer: Self-pay | Admitting: Unknown Physician Specialty

## 2015-01-29 VITALS — BP 151/82 | HR 84 | Temp 98.4°F | Ht 65.3 in | Wt 209.8 lb

## 2015-01-29 DIAGNOSIS — J0101 Acute recurrent maxillary sinusitis: Secondary | ICD-10-CM

## 2015-01-29 MED ORDER — AMOXICILLIN 875 MG PO TABS: 875.0000 mg | ORAL_TABLET | Freq: Two times a day (BID) | ORAL | Status: DC

## 2015-01-29 NOTE — Progress Notes (Signed)
   BP 151/82 mmHg  Pulse 84  Temp(Src) 98.4 F (36.9 C)  Ht 5' 5.3" (1.659 m)  Wt 209 lb 12.8 oz (95.165 kg)  BMI 34.58 kg/m2  SpO2 98%  LMP  (LMP Unknown)   Subjective:    Patient ID: Allison Phillips, female    DOB: [birth year], 65 y.o.   MRN: [id number]  HPI: Allison Phillips is a 65 y.o. female  Chief Complaint  Patient presents with  . URI    pt states she nasal congestion, sinus pressure, cough (productive), and headache. States symptoms started Sunday.   URI  This is a new problem. The current episode started in the past 7 days. The problem has been gradually worsening. Associated symptoms include congestion, headaches, a plugged ear sensation and sinus pain. She has tried antihistamine and decongestant for the symptoms. The treatment provided no relief.     Relevant past medical, surgical, family and social history reviewed and updated as indicated. Interim medical history since our last visit reviewed. Allergies and medications reviewed and updated.  Review of Systems  HENT: Positive for congestion.   Neurological: Positive for headaches.    Per HPI unless specifically indicated above     Objective:    BP 151/82 mmHg  Pulse 84  Temp(Src) 98.4 F (36.9 C)  Ht 5' 5.3" (1.659 m)  Wt 209 lb 12.8 oz (95.165 kg)  BMI 34.58 kg/m2  SpO2 98%  LMP  (LMP Unknown)  Wt Readings from Last 3 Encounters:  01/29/15 209 lb 12.8 oz (95.165 kg)  01/27/15 214 lb (97.07 kg)  01/06/15 215 lb (97.523 kg)    Physical Exam  Constitutional: She is oriented to person, place, and time. She appears well-developed and well-nourished. No distress.  HENT:  Head: Normocephalic and atraumatic.  Right Ear: Tympanic membrane and ear canal normal.  Left Ear: Tympanic membrane and ear canal normal.  Nose: No rhinorrhea. Right sinus exhibits maxillary sinus tenderness. Right sinus exhibits no frontal sinus tenderness. Left sinus exhibits maxillary sinus tenderness. Left sinus exhibits no  frontal sinus tenderness.  Eyes: Conjunctivae and lids are normal. Right eye exhibits no discharge. Left eye exhibits no discharge. No scleral icterus.  Cardiovascular: Normal rate and regular rhythm.   Pulmonary/Chest: Effort normal and breath sounds normal. No respiratory distress.  Abdominal: Normal appearance. There is no splenomegaly or hepatomegaly.  Musculoskeletal: Normal range of motion.  Neurological: She is alert and oriented to person, place, and time.  Skin: Skin is intact. No rash noted. No pallor.  Psychiatric: She has a normal mood and affect. Her behavior is normal. Judgment and thought content normal.   Assessment & Plan:   Problem List Items Addressed This Visit    None    Visit Diagnoses    Acute recurrent maxillary sinusitis    -  Primary    Relevant Medications    amoxicillin (AMOXIL) 875 MG tablet        Follow up plan: Return if symptoms worsen or fail to improve.

## 2015-01-30 NOTE — Telephone Encounter (Signed)
MRI was reviewed. No findings of concern in  supraclavicular space or upper chest area. Her symptoms are not well explained by the small lipoma in right clavicular region. Pt advised. She was advised to have an orthopedic eval if her neck and arm symptoms worsen.

## 2015-01-30 NOTE — Addendum Note (Signed)
Addended by: [name] on: 01/30/2015 11:36 AM   Modules accepted: Orders

## 2015-01-30 NOTE — Telephone Encounter (Signed)
I will set up an orthopedic referral for her.  Allison Phillips, please let her know

## 2015-01-30 NOTE — Telephone Encounter (Signed)
Called and let patient know about referral.

## 2015-02-20 ENCOUNTER — Encounter: Payer: Self-pay | Admitting: General Surgery

## 2015-02-28 ENCOUNTER — Other Ambulatory Visit: Payer: Self-pay | Admitting: Orthopedic Surgery

## 2015-02-28 DIAGNOSIS — M5412 Radiculopathy, cervical region: Secondary | ICD-10-CM

## 2015-03-24 ENCOUNTER — Ambulatory Visit
Admission: RE | Admit: 2015-03-24 | Discharge: 2015-03-24 | Disposition: A | Discharge status: Home / Self Care | Payer: BC Managed Care – PPO | Source: Ambulatory Visit | Attending: Orthopedic Surgery | Admitting: Orthopedic Surgery

## 2015-03-24 DIAGNOSIS — M5021 Other cervical disc displacement,  high cervical region: Secondary | ICD-10-CM | POA: Insufficient documentation

## 2015-03-24 DIAGNOSIS — M5412 Radiculopathy, cervical region: Secondary | ICD-10-CM | POA: Diagnosis not present

## 2015-03-24 DIAGNOSIS — M5382 Other specified dorsopathies, cervical region: Secondary | ICD-10-CM | POA: Diagnosis not present

## 2015-07-19 HISTORY — PX: LIPOMA EXCISION: SHX5283

## 2015-07-24 ENCOUNTER — Encounter: Payer: Self-pay | Admitting: *Deleted

## 2015-07-30 ENCOUNTER — Encounter: Payer: Self-pay | Admitting: General Surgery

## 2015-07-30 ENCOUNTER — Ambulatory Visit (INDEPENDENT_AMBULATORY_CARE_PROVIDER_SITE_OTHER): Payer: BC Managed Care – PPO | Admitting: General Surgery

## 2015-07-30 DIAGNOSIS — D1720 Benign lipomatous neoplasm of skin and subcutaneous tissue of unspecified limb: Secondary | ICD-10-CM

## 2015-07-30 NOTE — Patient Instructions (Signed)
Patient to return for excision right supraclavicular lipoma

## 2015-07-30 NOTE — Progress Notes (Signed)
Patient ID: Allison Phillips, female   DOB: 1950/05/24, 65 y.o.   MRN: [id number]  Chief Complaint  Patient presents with  . Lipoma    HPI Allison Phillips is a 65 y.o. female here today for a evaluation of a right shoulder lipoma. Patient states this area has been there for about two years now . She first noticed some pain along the top of right shoulder area in July 2016 when she was lifting a heavy pan. Later she noted a small lump near top of right shoulder. She wants to make sure the lipoma is not pushing on her muscle causing the pain. She had an xray of C spine and ultrasound on 12/31/14. States the area is getting bigger.  HPI  Past Medical History  Diagnosis Date  . Reflux   . Depression   . Osteoporosis   . Diverticulitis   . Tobacco use   . Bursitis of left hip   . Obesity     Past Surgical History  Procedure Laterality Date  . Anal fissure repair    . Oophorectomy      for "fibroids" not sure which ovary  . Cesarean section  1978  . Appendectomy    . Colonoscopy with propofol N/A 12/18/2014    Procedure: COLONOSCOPY WITH PROPOFOL;  Surgeon: Christene Lye, MD;  Location: [facility] ENDOSCOPY;  Service: Endoscopy;  Laterality: N/A;    Family History  Problem Relation Age of Onset  . Hypertension Mother   . Kidney disease Mother   . Alzheimer's disease Mother     Social History Social History  Substance Use Topics  . Smoking status: Current Every Day Smoker -- 0.50 packs/day for 15 years    Types: Cigarettes  . Smokeless tobacco: Never Used  . Alcohol Use: 0.0 oz/week    0 Standard drinks or equivalent per week     Comment: on occasion    No Known Allergies  Current Outpatient Prescriptions  Medication Sig Dispense Refill  . Fish Oil-Cholecalciferol (FISH OIL + D3) 1000-1000 MG-UNIT CAPS Take by mouth. Reported on 01/06/2015    . Multiple Vitamin (MULTIVITAMIN) tablet Take 1 tablet by mouth daily.    . vitamin B-12 (CYANOCOBALAMIN) 250 MCG tablet Take  250 mcg by mouth daily. Reported on 01/06/2015    . vitamin E 200 UNIT capsule Take 200 Units by mouth daily. Reported on 01/06/2015     No current facility-administered medications for this visit.    Review of Systems Review of Systems  Constitutional: Negative.   Respiratory: Negative.   Cardiovascular: Negative.     Blood pressure 158/80, pulse 76, resp. rate 14, height 5\' 5"  (1.651 m), weight 214 lb (97.07 kg).  Physical Exam Physical Exam  Constitutional: She is oriented to person, place, and time. She appears well-developed and well-nourished.  Eyes: Conjunctivae are normal. No scleral icterus.  Neck: Neck supple.  Lymphadenopathy:    She has no cervical adenopathy.  Neurological: She is alert and oriented to person, place, and time.  Skin: Skin is warm and dry.       Data Reviewed Notes reviewed  Assessment    Lipoma right shoulder. This measured 2 cm last yr but it is now 4cm size. Unsure if this is causing her neck symptoms but given increase in size excision is reasonable. This can be done in office and pt advised accordingly. She is agreeable.    Plan      Patient to return for excision right  clavicular lipoma.  PCP:  Julian Hy, This information has been scribed by Gaspar Cola CMA.    SANKAR,SEEPLAPUTHUR G 07/30/2015, 9:24 AM

## 2015-08-07 ENCOUNTER — Ambulatory Visit (INDEPENDENT_AMBULATORY_CARE_PROVIDER_SITE_OTHER): Payer: BC Managed Care – PPO | Admitting: General Surgery

## 2015-08-07 ENCOUNTER — Encounter: Payer: Self-pay | Admitting: General Surgery

## 2015-08-07 VITALS — BP 132/74 | HR 73 | Resp 14 | Ht 66.0 in | Wt 213.0 lb

## 2015-08-07 DIAGNOSIS — D1720 Benign lipomatous neoplasm of skin and subcutaneous tissue of unspecified limb: Secondary | ICD-10-CM | POA: Diagnosis not present

## 2015-08-07 NOTE — Progress Notes (Signed)
Patient ID: Allison Phillips, female   DOB: 1950-03-21, 65 y.o.   MRN: [id number] Patient is here for an excision of lipoma right clavicular region. She states no new changes.   Procedure: Excision lipoma, right clavicular region  Anesthesia: 85mL 1% xylocaine mixed with 0.5% marcaine  Prep: Chloroprep  Description: Patient was supine on the table and the area was infiltrated with anesthesia. The area was then prepped and draped with sterile towels. A linear 3cm incision was made along the skin fold atop the mass. A 3cm lipoma was identified and removed completely. Bleeding points were cauterized. The subcutaneous tissue was closed with 3-0 Vicryl. A subcuticular running stitch of 4-0 Vicryl was used to close the skin. The incision was covered with Dermabond.  Complications: No immediate complications. Patient was advised on wound care. Excised tissue was sent to pathology. Patient will be notified on results.      PCP: [name], PA

## 2015-08-07 NOTE — Patient Instructions (Signed)

## 2015-08-12 ENCOUNTER — Telehealth: Payer: Self-pay | Admitting: *Deleted

## 2015-08-12 NOTE — Telephone Encounter (Signed)
-----   Message from Christene Lye, MD sent at 08/12/2015  8:16 AM EDT ----- Rosann Auerbach, please let pt pt know the pathology was normal.

## 2015-08-12 NOTE — Telephone Encounter (Signed)
Patient called and wants to know the results from her pathology that was done on 08/07/15

## 2015-08-12 NOTE — Telephone Encounter (Signed)
Notified patient as instructed, patient pleased. Discussed follow-up appointments as needed, patient agrees  

## 2015-11-07 ENCOUNTER — Encounter (INDEPENDENT_AMBULATORY_CARE_PROVIDER_SITE_OTHER): Payer: Self-pay

## 2015-11-19 HISTORY — PX: LIPOMA EXCISION: SHX5283

## 2015-11-20 ENCOUNTER — Other Ambulatory Visit: Payer: Self-pay | Admitting: Unknown Physician Specialty

## 2015-11-20 DIAGNOSIS — Z1231 Encounter for screening mammogram for malignant neoplasm of breast: Secondary | ICD-10-CM

## 2015-11-26 ENCOUNTER — Encounter: Payer: Self-pay | Admitting: Unknown Physician Specialty

## 2015-11-26 ENCOUNTER — Ambulatory Visit (INDEPENDENT_AMBULATORY_CARE_PROVIDER_SITE_OTHER): Payer: BC Managed Care – PPO | Admitting: Unknown Physician Specialty

## 2015-11-26 VITALS — BP 125/77 | HR 77 | Temp 98.4°F | Ht 65.5 in | Wt 210.6 lb

## 2015-11-26 DIAGNOSIS — Z0000 Encounter for general adult medical examination without abnormal findings: Secondary | ICD-10-CM

## 2015-11-26 DIAGNOSIS — Z720 Tobacco use: Secondary | ICD-10-CM

## 2015-11-26 NOTE — Assessment & Plan Note (Signed)
Encouraged to quit. 

## 2015-11-26 NOTE — Progress Notes (Signed)
BP 125/77 (BP Location: Left Arm, Patient Position: Sitting, Cuff Size: Large)   Pulse 77   Temp 98.4 F (36.9 C)   Ht 5' 5.5" (1.664 m)   Wt 210 lb 9.6 oz (95.5 kg)   LMP  (LMP Unknown)   SpO2 99%   BMI 34.51 kg/m    Subjective:    Patient ID: Allison Phillips, female    DOB: [birth year], 65 y.o.   MRN: [id number]  HPI: Allison Phillips is a 65 y.o. female  Chief Complaint  Patient presents with  . Annual Exam   Social History   Social History  . Marital status: Married    Spouse name: N/A  . Number of children: N/A  . Years of education: N/A   Occupational History  . Not on file.   Social History Main Topics  . Smoking status: Current Every Day Smoker    Packs/day: 0.50    Years: 15.00    Types: Cigarettes  . Smokeless tobacco: Never Used  . Alcohol use 0.0 oz/week     Comment: on occasion  . Drug use: No  . Sexual activity: Yes   Other Topics Concern  . Not on file   Social History Narrative  . No narrative on file   Family History  Problem Relation Age of Onset  . Hypertension Mother   . Kidney disease Mother   . Alzheimer's disease Mother    Past Medical History:  Diagnosis Date  . Bursitis of left hip   . Depression   . Diverticulitis   . Obesity   . Osteoporosis   . Reflux   . Tobacco use    Past Surgical History:  Procedure Laterality Date  . ANAL FISSURE REPAIR    . APPENDECTOMY    . [place]  . COLONOSCOPY WITH PROPOFOL N/A 12/18/2014   Procedure: COLONOSCOPY WITH PROPOFOL;  Surgeon: Christene Lye, MD;  Location: [facility] ENDOSCOPY;  Service: Endoscopy;  Laterality: N/A;  . LIPOMA EXCISION  07/2015   right shoulder  . OOPHORECTOMY     for "fibroids" not sure which ovary      Relevant past medical, surgical, family and social history reviewed and updated as indicated. Interim medical history since our last visit reviewed. Allergies and medications reviewed and updated.  Review of Systems  Constitutional:  Negative.   HENT: Negative.   Eyes: Negative.   Respiratory: Negative.   Cardiovascular: Negative.   Gastrointestinal: Negative.   Endocrine: Negative.   Genitourinary: Negative.   Musculoskeletal:       Neck and right shoulder pain.  Treated by Orthopedics  Skin: Negative.   Allergic/Immunologic: Negative.   Neurological: Negative.   Hematological: Negative.   Psychiatric/Behavioral: Negative.     Per HPI unless specifically indicated above     Objective:    BP 125/77 (BP Location: Left Arm, Patient Position: Sitting, Cuff Size: Large)   Pulse 77   Temp 98.4 F (36.9 C)   Ht 5' 5.5" (1.664 m)   Wt 210 lb 9.6 oz (95.5 kg)   LMP  (LMP Unknown)   SpO2 99%   BMI 34.51 kg/m   Wt Readings from Last 3 Encounters:  11/26/15 210 lb 9.6 oz (95.5 kg)  08/07/15 213 lb (96.6 kg)  07/30/15 214 lb (97.1 kg)    Physical Exam  Constitutional: She is oriented to person, place, and time. She appears well-developed and well-nourished. No distress.  HENT:  Head: Normocephalic and atraumatic.  Eyes: Conjunctivae and lids are normal. Pupils are equal, round, and reactive to light. Right eye exhibits no discharge. Left eye exhibits no discharge. No scleral icterus.  Neck: Normal range of motion. Neck supple. No JVD present. Carotid bruit is not present. No thyromegaly present.  Cardiovascular: Normal rate, regular rhythm and normal heart sounds.  Exam reveals no gallop and no friction rub.   No murmur heard. Pulmonary/Chest: Effort normal and breath sounds normal. No respiratory distress. She has no wheezes. She has no rales.  Abdominal: Soft. Normal appearance and bowel sounds are normal. There is no splenomegaly or hepatomegaly. There is no tenderness. There is no rebound.  Genitourinary: No breast swelling, tenderness or discharge.  Musculoskeletal: Normal range of motion.  Lymphadenopathy:    She has no cervical adenopathy.  Neurological: She is alert and oriented to person, place,  and time.  Skin: Skin is warm, dry and intact. No rash noted. No pallor.  Psychiatric: She has a normal mood and affect. Her speech is normal and behavior is normal. Judgment and thought content normal. Cognition and memory are normal.    Assessment & Plan:   Problem List Items Addressed This Visit      Unprioritized   Tobacco use    Encouraged to quit.         Other Visit Diagnoses    Routine general medical examination at a health care facility    -  Primary   Relevant Orders   CBC with Differential/Platelet   Comprehensive metabolic panel   Lipid Panel w/o Chol/HDL Ratio   TSH       Follow up plan: As needed

## 2015-11-27 ENCOUNTER — Encounter: Payer: Self-pay | Admitting: Unknown Physician Specialty

## 2015-11-27 LAB — COMPREHENSIVE METABOLIC PANEL
A/G RATIO: 1.6 (ref 1.2–2.2)
ALT: 9 IU/L (ref 0–32)
AST: 13 IU/L (ref 0–40)
Albumin: 4.1 g/dL (ref 3.6–4.8)
Alkaline Phosphatase: 55 IU/L (ref 39–117)
BILIRUBIN TOTAL: 0.3 mg/dL (ref 0.0–1.2)
BUN/Creatinine Ratio: 16 (ref 12–28)
BUN: 12 mg/dL (ref 8–27)
CALCIUM: 9.5 mg/dL (ref 8.7–10.3)
CHLORIDE: 102 mmol/L (ref 96–106)
CO2: 27 mmol/L (ref 18–29)
Creatinine, Ser: 0.73 mg/dL (ref 0.57–1.00)
GFR calc non Af Amer: 87 mL/min/{1.73_m2} (ref >59)
GFR, EST AFRICAN AMERICAN: 101 mL/min/{1.73_m2} (ref >59)
GLUCOSE: 125 mg/dL — AB (ref 65–99)
Globulin, Total: 2.6 g/dL (ref 1.5–4.5)
POTASSIUM: 4 mmol/L (ref 3.5–5.2)
Sodium: 143 mmol/L (ref 134–144)
TOTAL PROTEIN: 6.7 g/dL (ref 6.0–8.5)

## 2015-11-27 LAB — CBC WITH DIFFERENTIAL/PLATELET
BASOS ABS: 0.1 10*3/uL (ref 0.0–0.2)
Basos: 1 %
EOS (ABSOLUTE): 0.2 10*3/uL (ref 0.0–0.4)
Eos: 3 %
Hematocrit: 38.5 % (ref 34.0–46.6)
Hemoglobin: 12.8 g/dL (ref 11.1–15.9)
IMMATURE GRANS (ABS): 0 10*3/uL (ref 0.0–0.1)
IMMATURE GRANULOCYTES: 0 %
LYMPHS: 41 %
Lymphocytes Absolute: 2.8 10*3/uL (ref 0.7–3.1)
MCH: 27.9 pg (ref 26.6–33.0)
MCHC: 33.2 g/dL (ref 31.5–35.7)
MCV: 84 fL (ref 79–97)
MONOS ABS: 0.4 10*3/uL (ref 0.1–0.9)
Monocytes: 6 %
NEUTROS PCT: 49 %
Neutrophils Absolute: 3.4 10*3/uL (ref 1.4–7.0)
PLATELETS: 307 10*3/uL (ref 150–379)
RBC: 4.58 x10E6/uL (ref 3.77–5.28)
RDW: 14.6 % (ref 12.3–15.4)
WBC: 7 10*3/uL (ref 3.4–10.8)

## 2015-11-27 LAB — LIPID PANEL W/O CHOL/HDL RATIO
Cholesterol, Total: 159 mg/dL (ref 100–199)
HDL: 62 mg/dL (ref >39)
LDL Calculated: 69 mg/dL (ref 0–99)
TRIGLYCERIDES: 141 mg/dL (ref 0–149)
VLDL CHOLESTEROL CAL: 28 mg/dL (ref 5–40)

## 2015-11-27 LAB — TSH: TSH: 1.09 u[IU]/mL (ref 0.450–4.500)

## 2015-12-16 ENCOUNTER — Telehealth: Payer: Self-pay | Admitting: *Deleted

## 2015-12-16 NOTE — Telephone Encounter (Signed)
Received referral for initial lung cancer screening scan. Contacted patient and obtained smoking history,(16 pack year). Discussed that patient will not meet current criteria for screening scan. Also discussed smoking cessation programs available through [employer].

## 2016-01-01 ENCOUNTER — Ambulatory Visit
Admission: RE | Admit: 2016-01-01 | Discharge: 2016-01-01 | Disposition: A | Discharge status: Home / Self Care | Payer: BC Managed Care – PPO | Source: Ambulatory Visit | Attending: Unknown Physician Specialty | Admitting: Unknown Physician Specialty

## 2016-01-01 DIAGNOSIS — Z1231 Encounter for screening mammogram for malignant neoplasm of breast: Secondary | ICD-10-CM | POA: Insufficient documentation

## 2016-03-08 ENCOUNTER — Ambulatory Visit (INDEPENDENT_AMBULATORY_CARE_PROVIDER_SITE_OTHER): Payer: Medicare Other | Admitting: Unknown Physician Specialty

## 2016-03-08 ENCOUNTER — Encounter: Payer: Self-pay | Admitting: Unknown Physician Specialty

## 2016-03-08 VITALS — BP 155/96 | HR 72 | Temp 98.5°F | Wt 214.8 lb

## 2016-03-08 DIAGNOSIS — E119 Type 2 diabetes mellitus without complications: Secondary | ICD-10-CM | POA: Insufficient documentation

## 2016-03-08 DIAGNOSIS — R7301 Impaired fasting glucose: Secondary | ICD-10-CM | POA: Diagnosis not present

## 2016-03-08 DIAGNOSIS — I10 Essential (primary) hypertension: Secondary | ICD-10-CM | POA: Diagnosis not present

## 2016-03-08 LAB — BAYER DCA HB A1C WAIVED: HB A1C (BAYER DCA - WAIVED): 6.5 % (ref <7.0)

## 2016-03-08 LAB — GLUCOSE HEMOCUE WAIVED: GLU HEMOCUE WAIVED: 103 mg/dL — AB (ref 65–99)

## 2016-03-08 MED ORDER — LISINOPRIL 5 MG PO TABS: 5.0000 mg | ORAL_TABLET | Freq: Every day | ORAL | 3 refills | Status: DC

## 2016-03-08 NOTE — Progress Notes (Addendum)
BP (!) 155/96 (BP Location: Left Arm, Cuff Size: Large)   Pulse 72   Temp 98.5 F (36.9 C)   Wt 214 lb 12.8 oz (97.4 kg)   LMP  (LMP Unknown)   SpO2 100%   BMI 35.20 kg/m    Subjective:    Patient ID: Allison Phillips, female    DOB: April 26, 1950, 66 y.o.   MRN: [id number]  HPI: Allison Phillips is a 66 y.o. female  Chief Complaint  Patient presents with  . Eye Problem    pt states on Friday she was seeing disco lights in her eyes. States she had to pull over for a few minutes because of it  . Headache    pt states on Friday after the light problem stopped, she got a headache. Headached stopped but states her head hurt whenever she stood up  . Diabetes    pt states she would like to be checked for diabetes. She stated she did some research and read that urinary frequency and sweats were signs of diabetes. Patient stated she has been experiencing both of these.    Pt with complaints as above:  She is particularly worried about DM with fatigue and frequent urination.  Sister just got diagnosed.  Eye symptoms and headache have resolved.  No change in vision.    Relevant past medical, surgical, family and social history reviewed and updated as indicated. Interim medical history since our last visit reviewed. Allergies and medications reviewed and updated.  Review of Systems  Per HPI unless specifically indicated above     Objective:    BP (!) 155/96 (BP Location: Left Arm, Cuff Size: Large)   Pulse 72   Temp 98.5 F (36.9 C)   Wt 214 lb 12.8 oz (97.4 kg)   LMP  (LMP Unknown)   SpO2 100%   BMI 35.20 kg/m   Wt Readings from Last 3 Encounters:  03/08/16 214 lb 12.8 oz (97.4 kg)  11/26/15 210 lb 9.6 oz (95.5 kg)  08/07/15 213 lb (96.6 kg)    Physical Exam  Constitutional: She is oriented to person, place, and time. She appears well-developed and well-nourished. No distress.  HENT:  Head: Normocephalic and atraumatic.  Eyes: Conjunctivae and lids are normal. Right eye  exhibits no discharge. Left eye exhibits no discharge. No scleral icterus.  Neck: Normal range of motion. Neck supple. No JVD present. Carotid bruit is not present.  Cardiovascular: Normal rate, regular rhythm and normal heart sounds.   Pulmonary/Chest: Effort normal and breath sounds normal.  Abdominal: Normal appearance. There is no splenomegaly or hepatomegaly.  Musculoskeletal: Normal range of motion.  Neurological: She is alert and oriented to person, place, and time.  Skin: Skin is warm, dry and intact. No rash noted. No pallor.  Psychiatric: She has a normal mood and affect. Her behavior is normal. Judgment and thought content normal.    Results for orders placed or performed in visit on 11/26/15  CBC with Differential/Platelet  Result Value Ref Range   WBC 7.0 3.4 - 10.8 x10E3/uL   RBC 4.58 3.77 - 5.28 x10E6/uL   Hemoglobin 12.8 11.1 - 15.9 g/dL   Hematocrit 38.5 34.0 - 46.6 %   MCV 84 79 - 97 fL   MCH 27.9 26.6 - 33.0 pg   MCHC 33.2 31.5 - 35.7 g/dL   RDW 14.6 12.3 - 15.4 %   Platelets 307 150 - 379 x10E3/uL   Neutrophils 49 Not Estab. %   Lymphs  41 Not Estab. %   Monocytes 6 Not Estab. %   Eos 3 Not Estab. %   Basos 1 Not Estab. %   Neutrophils Absolute 3.4 1.4 - 7.0 x10E3/uL   Lymphocytes Absolute 2.8 0.7 - 3.1 x10E3/uL   Monocytes Absolute 0.4 0.1 - 0.9 x10E3/uL   EOS (ABSOLUTE) 0.2 0.0 - 0.4 x10E3/uL   Basophils Absolute 0.1 0.0 - 0.2 x10E3/uL   Immature Granulocytes 0 Not Estab. %   Immature Grans (Abs) 0.0 0.0 - 0.1 x10E3/uL  Comprehensive metabolic panel  Result Value Ref Range   Glucose 125 (H) 65 - 99 mg/dL   BUN 12 8 - 27 mg/dL   Creatinine, Ser 0.73 0.57 - 1.00 mg/dL   GFR calc non Af Amer 87 >59 mL/min/1.73   GFR calc Af Amer 101 >59 mL/min/1.73   BUN/Creatinine Ratio 16 12 - 28   Sodium 143 134 - 144 mmol/L   Potassium 4.0 3.5 - 5.2 mmol/L   Chloride 102 96 - 106 mmol/L   CO2 27 18 - 29 mmol/L   Calcium 9.5 8.7 - 10.3 mg/dL   Total Protein 6.7 6.0  - 8.5 g/dL   Albumin 4.1 3.6 - 4.8 g/dL   Globulin, Total 2.6 1.5 - 4.5 g/dL   Albumin/Globulin Ratio 1.6 1.2 - 2.2   Bilirubin Total 0.3 0.0 - 1.2 mg/dL   Alkaline Phosphatase 55 39 - 117 IU/L   AST 13 0 - 40 IU/L   ALT 9 0 - 32 IU/L  Lipid Panel w/o Chol/HDL Ratio  Result Value Ref Range   Cholesterol, Total 159 100 - 199 mg/dL   Triglycerides 141 0 - 149 mg/dL   HDL 62 >39 mg/dL   VLDL Cholesterol Cal 28 5 - 40 mg/dL   LDL Calculated 69 0 - 99 mg/dL  TSH  Result Value Ref Range   TSH 1.090 0.450 - 4.500 uIU/mL      Assessment & Plan:   Problem List Items Addressed This Visit      Unprioritized   Controlled type 2 diabetes mellitus without complication, without long-term current use of insulin (HCC)    New diagnosis .  Refer to lifestyle center      Relevant Medications   lisinopril (PRINIVIL,ZESTRIL) 5 MG tablet   Other Relevant Orders   Amb Referral to Nutrition and Diabetic E   Essential hypertension, benign    Start Lisinopril 5 mg.  Recheck 1 month      Relevant Medications   lisinopril (PRINIVIL,ZESTRIL) 5 MG tablet    Other Visit Diagnoses    Impaired fasting glucose    -  Primary   Relevant Orders   Bayer DCA Hb A1c Waived   Glucose Hemocue Waived      Offered to get her with a eye professional ASAP.  She wanted to look for one herself to assure coverage.  Discussed symptoms and risk of a retinal tear.    Follow up plan: Return in about 4 weeks (around 04/05/2016) for BP and cholesterol.

## 2016-03-08 NOTE — Assessment & Plan Note (Signed)
New diagnosis .  Refer to lifestyle center

## 2016-03-08 NOTE — Patient Instructions (Addendum)
Hgb A1 C is a 3 month measure of your blood sugar.  I would like to send you to the lifestyle center for diabetes and exercise.     Preventing Type 2 Diabetes Mellitus Type 2 diabetes (type 2 diabetes mellitus) is a long-term (chronic) disease that affects blood sugar (glucose) levels. Normally, a hormone called insulin allows glucose to enter cells in the body. The cells use glucose for energy. In type 2 diabetes, one or both of these problems may be present:  The body does not make enough insulin.  The body does not respond properly to insulin that it makes (insulin resistance). Insulin resistance or lack of insulin causes excess glucose to build up in the blood instead of going into cells. As a result, high blood glucose (hyperglycemia) develops, which can cause many complications. Being overweight or obese and having an inactive (sedentary) lifestyle can increase your risk for diabetes. Type 2 diabetes can be delayed or prevented by making certain nutrition and lifestyle changes. What nutrition changes can be made?  Eat healthy meals and snacks regularly. Keep a healthy snack with you for when you get hungry between meals, such as fruit or a handful of nuts.  Eat lean meats and proteins that are low in saturated fats, such as chicken, fish, egg whites, and beans. Avoid processed meats.  Eat plenty of fruits and vegetables and plenty of grains that have not been processed (whole grains). It is recommended that you eat:  1?2 cups of fruit every day.  2?3 cups of vegetables every day.  6?8 oz of whole grains every day, such as oats, whole wheat, bulgur, brown rice, quinoa, and millet.  Eat low-fat dairy products, such as milk, yogurt, and cheese.  Eat foods that contain healthy fats, such as nuts, avocado, olive oil, and canola oil.  Drink water throughout the day. Avoid drinks that contain added sugar, such as soda or sweet tea.  Follow instructions from your health care provider  about specific eating or drinking restrictions.  Control how much food you eat at a time (portion size).  Check food labels to find out the serving sizes of foods.  Use a kitchen scale to weigh amounts of foods.  Saute or steam food instead of frying it. Cook with water or broth instead of oils or butter.  Limit your intake of:  Salt (sodium). Have no more than 1 tsp (2,400 mg) of sodium a day. If you have heart disease or high blood pressure, have less than ? tsp (1,500 mg) of sodium a day.  Saturated fat. This is fat that is solid at room temperature, such as butter or fat on meat. What lifestyle changes can be made?  Activity  Do moderate-intensity physical activity for at least 30 minutes on at least 5 days of the week, or as much as told by your health care provider.  Ask your health care provider what activities are safe for you. A mix of physical activities may be best, such as walking, swimming, cycling, and strength training.  Try to add physical activity into your day. For example:  Park in spots that are farther away than usual, so that you walk more. For example, park in a far corner of the parking lot when you go to the office or the grocery store.  Take a walk during your lunch break.  Use stairs instead of elevators or escalators. Weight Loss  Lose weight as directed. Your health care provider can determine how much  weight loss is best for you and can help you lose weight safely.  If you are overweight or obese, you may be instructed to lose at least 5?7 % of your body weight. Alcohol and Tobacco   Limit alcohol intake to no more than 1 drink a day for nonpregnant women and 2 drinks a day for men. One drink equals 12 oz of beer, 5 oz of wine, or 1 oz of hard liquor.  Do not use any tobacco products, such as cigarettes, chewing tobacco, and e-cigarettes. If you need help quitting, ask your health care provider. Work With [place] Provider  Have your  blood glucose tested regularly, as told by your health care provider.  Discuss your risk factors and how you can reduce your risk for diabetes.  Get screening tests as told by your health care provider. You may have screening tests regularly, especially if you have certain risk factors for type 2 diabetes.  Make an appointment with a diet and nutrition specialist (registered dietitian). A registered dietitian can help you make a healthy eating plan and can help you understand portion sizes and food labels. Why are these changes important?  It is possible to prevent or delay type 2 diabetes and related health problems by making lifestyle and nutrition changes.  It can be difficult to recognize signs of type 2 diabetes. The best way to avoid possible damage to your body is to take actions to prevent the disease before you develop symptoms. What can happen if changes are not made?  Your blood glucose levels may keep increasing. Having high blood glucose for a long time is dangerous. Too much glucose in your blood can damage your blood vessels, heart, kidneys, nerves, and eyes.  You may develop prediabetes or type 2 diabetes. Type 2 diabetes can lead to many chronic health problems and complications, such as:  Heart disease.  Stroke.  Blindness.  Kidney disease.  Depression.  Poor circulation in the feet and legs, which could lead to surgical removal (amputation) in severe cases. Where to find support:  Ask your health care provider to recommend a registered dietitian, diabetes educator, or weight loss program.  Look for local or online weight loss groups.  Join a gym, fitness club, or outdoor activity group, such as a walking club. Where to find more information: To learn more about diabetes and diabetes prevention, visit:  American Diabetes Association (ADA): www.diabetes.[employer] of Diabetes and Digestive and Kidney Diseases:  FindSpin.nl To learn more about healthy eating, visit:  The U.S. Department of Agriculture Scientist, research (physical sciences)), Choose My Plate: http://wiley-williams.com/  Office of Disease Prevention and Health Promotion (ODPHP), Dietary Guidelines: SurferLive.at Summary  You can reduce your risk for type 2 diabetes by increasing your physical activity, eating healthy foods, and losing weight as directed.  Talk with your health care provider about your risk for type 2 diabetes. Ask about any blood tests or screening tests that you need to have. This information is not intended to replace advice given to you by your health care provider. Make sure you discuss any questions you have with your health care provider. Document Released: 04/28/2015 Document Revised: 06/12/2015 Document Reviewed: 02/25/2015 Elsevier Interactive Patient Education  2017 [employer].  Managing Your Hypertension Hypertension is commonly called high blood pressure. Blood pressure is a measurement of how strongly your blood is pressing against the walls of your arteries. Arteries are blood vessels that carry blood from your heart throughout your body. Blood pressure  does not stay the same. It rises when you are active, excited, or nervous. It lowers when you are sleeping or relaxed. If the numbers that measure your blood pressure stay above normal most of the time, you are at risk for health problems. Hypertension is a long-term (chronic) condition in which blood pressure is elevated. This condition often has no signs or symptoms. The cause of the condition is usually not known. What are blood pressure readings? A blood pressure reading is recorded as two numbers, such as "120 over 80" (or 120/80). The first ("top") number is called the systolic pressure. It is a measure of the pressure in your arteries as the heart beats. The second ("bottom") number is called the diastolic pressure. It is a  measure of the pressure in your arteries as the heart relaxes between beats. What does my blood pressure reading mean? Blood pressure is classified into four stages. Based on your blood pressure reading, your health care provider may use the following stages to determine what type of treatment, if any, is needed. Systolic pressure and diastolic pressure are measured in a unit called mm Hg. Normal  Systolic pressure: below 123456.  Diastolic pressure: below 80. Prehypertension  Systolic pressure: 123456.  Diastolic pressure: [id number]. Hypertension stage 1  Systolic pressure: A999333.  Diastolic pressure: A999333. Hypertension stage 2  Systolic pressure: 0000000 or above.  Diastolic pressure: 123XX123 or above. What health risks are associated with hypertension? Managing your hypertension is an important responsibility. Uncontrolled hypertension can lead to:  A heart attack.  A stroke.  A weakened blood vessel (aneurysm).  Heart failure.  Kidney damage.  Eye damage.  Metabolic syndrome.  Memory and concentration problems. What changes can I make to manage my hypertension? Hypertension can be managed effectively by making lifestyle changes and possibly by taking medicines. Your health care provider will help you come up with a plan to bring your blood pressure within a normal range. Your plan should include the following: Monitoring  Monitor your blood pressure at home as told by your health care provider. Your personal target blood pressure may vary depending on your medical conditions, your age, and other factors.  Have your blood pressure rechecked as told by your health care provider. Lifestyle  Lose weight if necessary.  Get at least 30-45 minutes of aerobic exercise at least 4 times a week.  Do not use any products that contain nicotine or tobacco, such as cigarettes and e-cigarettes. If you need help quitting, ask your health care provider.  Learn ways to reduce  stress.  Control any chronic conditions, such as high cholesterol or diabetes. Eating and drinking  Follow the DASH diet. This diet is high in fruits, vegetables, and whole grains. It is low in salt, red meat, and added sugars.  Keep your sodium intake below 2,300 mg per day.  Limit alcoholic beverages. Communication  Review all the medicines you take with your health care provider because there may be side effects or interactions.  Talk with your health care provider about your diet, exercise habits, and other lifestyle factors that may be contributing to hypertension.  See your health care provider regularly. Your health care provider can help you create and adjust your plan for managing hypertension. Will I need medicine to control my blood pressure? Your health care provider may prescribe medicine if lifestyle changes are not enough to get your blood pressure under control, and if one of the following is true:  You are 18-59 years of  age, and your systolic blood pressure is 140 or higher.  You are 95 years of age or older, and your systolic blood pressure is 150 or higher.  Your diastolic blood pressure is 90 or higher.  You have diabetes, and your systolic blood pressure is over [id number] or your diastolic blood pressure is over 90.  You have kidney disease, and your blood pressure is above 140/90.  You have heart disease or a history of stroke, and your blood pressure is 140/90 or higher. Take medicines only as told by your health care provider. Follow the directions carefully. Blood pressure medicines must be taken as prescribed. The medicine does not work as well when you skip doses. Skipping doses also puts you at risk for problems. Contact a health care provider if:  You think you are having a reaction to medicines you have taken.  You have repeated (recurrent) headaches.  You feel dizzy.  You have swelling in your ankles.  You have trouble with your vision. Get help  right away if:  You develop a severe headache or confusion.  You have unusual weakness or numbness, or you feel faint.  You have severe pain in your chest or abdomen.  You vomit repeatedly.  You have trouble breathing. This information is not intended to replace advice given to you by your health care provider. Make sure you discuss any questions you have with your health care provider. Document Released: 09/29/2011 Document Revised: 09/09/2015 Document Reviewed: 04/04/2015 Elsevier Interactive Patient Education  2017 [employer].

## 2016-03-08 NOTE — Assessment & Plan Note (Signed)
Start Lisinopril 5 mg.  Recheck 1 month

## 2016-03-23 ENCOUNTER — Encounter: Payer: Medicare Other | Attending: Unknown Physician Specialty | Admitting: Dietician

## 2016-03-23 ENCOUNTER — Encounter: Payer: Self-pay | Admitting: Dietician

## 2016-03-23 VITALS — BP 130/76 | Ht 67.0 in | Wt 215.2 lb

## 2016-03-23 DIAGNOSIS — Z6833 Body mass index (BMI) 33.0-33.9, adult: Secondary | ICD-10-CM | POA: Diagnosis not present

## 2016-03-23 DIAGNOSIS — E119 Type 2 diabetes mellitus without complications: Secondary | ICD-10-CM

## 2016-03-23 DIAGNOSIS — Z713 Dietary counseling and surveillance: Secondary | ICD-10-CM | POA: Diagnosis not present

## 2016-03-23 NOTE — Patient Instructions (Signed)
  Check blood sugars 2 x day before breakfast and 2 hrs after supper every day Exercise:  treadmill for   15 minutes  3  days a week Avoid sugar sweetened drinks (soda, tea, coffee, sports drinks, juices) Limit intake of sweets/desserts and fried foods Eat 3 meals day,   1-2  snacks a day Eat 3 carbohydrate servings/meal + protein Eat afternoon snack if eating late supper and eat bedtime snack if eating early supper Eat 1 carbohydrate serving/snack + protein Make healthy food choices Space meals 4-6 hours apart Quit / Decrease smoking Bring blood sugar records to the next appointment/class Call your doctor for a prescription for:  1. Meter strips (type) One Touch Verio test strips  checking  2 times per day  2. Lancets (type) One Touch Delica lancets  checking  2   times per day Get a [place] container Return for appointment/classes on:  04-19-16

## 2016-03-23 NOTE — Progress Notes (Signed)
Diabetes Self-Management Education  Visit Type: First/Initial  Appt. Start Time: 1600 Appt. End Time:1700  03/23/2016  Allison Phillips, identified by name and date of birth, is a 66 y.o. female with a diagnosis of Diabetes: Type 2.   ASSESSMENT  Blood pressure 130/76, height 5\' 7"  (1.702 m), weight 215 lb 3.2 oz (97.6 kg). Body mass index is 33.71 kg/m.      Diabetes Self-Management Education - 03/23/16 1714      Visit Information   Visit Type First/Initial     Initial Visit   Diabetes Type Type 2     Health Coping   How would you rate your overall health? Good     Psychosocial Assessment   Patient Belief/Attitude about Diabetes Motivated to manage diabetes   Self-care barriers None   Patient Concerns Weight Control;Glycemic Control;Healthy Lifestyle  quit smoking, become more fit, prevent complications   Special Needs None   Preferred Learning Style Visual   Learning Readiness Ready     Pre-Education Assessment   Patient understands the diabetes disease and treatment process. Needs Instruction   Patient understands incorporating nutritional management into lifestyle. Needs Instruction   Patient undertands incorporating physical activity into lifestyle. Needs Instruction   Patient understands using medications safely. Needs Instruction   Patient understands monitoring blood glucose, interpreting and using results Needs Instruction   Patient understands prevention, detection, and treatment of acute complications. Needs Instruction   Patient understands prevention, detection, and treatment of chronic complications. Needs Instruction   Patient understands how to develop strategies to address psychosocial issues. Needs Instruction   Patient understands how to develop strategies to promote health/change behavior. Needs Instruction     Complications   Last HgB A1C per patient/outside source 6.5 %  2 wks ago   How often do you check your blood sugar? 0 times/day (not  testing)   Have you had a dilated eye exam in the past 12 months? No  appt 1 week   Have you had a dental exam in the past 12 months? Yes  12-2015   Are you checking your feet? No     Dietary Intake   Breakfast --  eats breakfast at 7-8a   Snack (morning) --  occasional snack at 10:30a=chips, popcorn or cookies   Lunch --  eats lunch at 11-12noon; eats sweets 4-5x/wk and fried foods 2-3x/wk   Snack (afternoon) --  eats snack at 4:30-5p=chips, popcorn or cookies   Dinner --  supper time varies: 6p-8p   Snack (evening) --  none   Beverage(s) --  drinks regular sodas and water 4-5x/day, fruit juice 1x/day and milk 0-1x.day     Exercise   Exercise Type ADL's     Patient Education   Previous Diabetes Education No   Disease state  Definition of diabetes, type 1 and 2, and the diagnosis of diabetes;Explored patient's options for treatment of their diabetes   Nutrition management  Role of diet in the treatment of diabetes and the relationship between the three main macronutrients and blood glucose level;Food label reading, portion sizes and measuring food.;Carbohydrate counting   Physical activity and exercise  Role of exercise on diabetes management, blood pressure control and cardiac health.;Helped patient identify appropriate exercises in relation to his/her diabetes, diabetes complications and other health issue.   Monitoring Taught/evaluated SMBG meter.;Purpose and frequency of SMBG.;Taught/discussed recording of test results and interpretation of SMBG.;Identified appropriate SMBG and/or A1C goals.;Yearly dilated eye exam  gave pt One Touch Verio Flex meter and  instructed on its use; BG 94 ac   Chronic complications Relationship between chronic complications and blood glucose control;Lipid levels, blood glucose control and heart disease;Retinopathy and reason for yearly dilated eye exams;Dental care;Nephropathy, what it is, prevention of, the use of ACE, ARB's and early detection of  through urine microalbumia.;Reviewed with patient heart disease, higher risk of, and prevention   Personal strategies to promote health Lifestyle issues that need to be addressed for better diabetes care;Helped patient develop diabetes management plan for (enter comment);Review risk of smoking and offered smoking cessation  gave pt handout on free smoking cessation classes      Individualized Plan for Diabetes Self-Management Training:   Learning Objective:  Patient will have a greater understanding of diabetes self-management. Patient education plan is to attend individual and/or group sessions per assessed needs and concerns.   Plan:   Patient Instructions   Check blood sugars 2 x day before breakfast and 2 hrs after supper every day Exercise:  treadmill for   15 minutes  3  days a week Avoid sugar sweetened drinks (soda, tea, coffee, sports drinks, juices) Limit intake of sweets/desserts and fried foods Eat 3 meals day,   1-2  snacks a day Eat 3 carbohydrate servings/meal + protein Eat afternoon snack if eating late supper and eat bedtime snack if eating early supper Eat 1 carbohydrate serving/snack + protein Make healthy food choices Space meals 4-6 hours apart Quit / Decrease smoking Bring blood sugar records to the next appointment/class Call your doctor for a prescription for:  1. Meter strips (type) One Touch Verio test strips  checking  2 times per day  2. Lancets (type) One Touch Delica lancets  checking  2   times per day Get a Sharps container Return for appointment/classes on:  04-19-16   Expected Outcomes:   positive  Education material provided: General meal planning guidelines, One Touch Verio Flex meter  If problems or questions, patient to contact team via:  [phone]  Future DSME appointment:  04-19-16

## 2016-03-29 ENCOUNTER — Ambulatory Visit (INDEPENDENT_AMBULATORY_CARE_PROVIDER_SITE_OTHER): Payer: Medicare Other | Admitting: Unknown Physician Specialty

## 2016-03-29 VITALS — BP 115/80 | HR 80 | Temp 99.4°F | Wt 213.2 lb

## 2016-03-29 DIAGNOSIS — J019 Acute sinusitis, unspecified: Secondary | ICD-10-CM

## 2016-03-29 MED ORDER — AMOXICILLIN 875 MG PO TABS: 875.0000 mg | ORAL_TABLET | Freq: Two times a day (BID) | ORAL | 0 refills | Status: DC

## 2016-03-29 NOTE — Progress Notes (Signed)
BP 115/80 (BP Location: Left Arm, Patient Position: Sitting, Cuff Size: Large)   Pulse 80   Temp 99.4 F (37.4 C)   Wt 213 lb 3.2 oz (96.7 kg)   LMP  (LMP Unknown)   SpO2 95%   BMI 33.39 kg/m    Subjective:    Patient ID: Allison Phillips, female    DOB: 20-Jan-1950, 66 y.o.   MRN: [id number]  HPI: DAVIELLE Phillips is a 66 y.o. female  Chief Complaint  Patient presents with  . URI    pt states she has had head and nasal congestion, cough, sinus pressure, sore throat, and drainage for 3 day   URI Patient presents to clinic complaining of sore throat, green/yellow mucus rhinorrhea, productive green/yellow mucus cough x 3 days. Denies rash, ear pain, HA, fever/chills, shortness of breath, chest pain, neck swelling. Took Mucinex without relief. Not currently on any allergy regimen of Flonase or Zyrtec. 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 States she has a history of sinus infections and this feels similar and has taken Amoxicillin with relief in the past. No flu shot this year and states she has never received it but will in the future.   Relevant past medical, surgical, family and social history reviewed and updated as indicated. Interim medical history since our last visit reviewed. Allergies and medications reviewed and updated.  Review of Systems  Constitutional: Negative for chills, fatigue and fever.  HENT: Positive for congestion, rhinorrhea, sneezing and sore throat. Negative for ear discharge, ear pain, facial swelling, sinus pain, sinus pressure and trouble swallowing.   Eyes: Negative.  Negative for discharge and itching.  Respiratory: Positive for cough. Negative for chest tightness, shortness of breath, wheezing and stridor.   Cardiovascular: Negative for chest pain and palpitations.  Gastrointestinal: Negative for abdominal pain, constipation, diarrhea, nausea and vomiting.  Musculoskeletal: Negative for myalgias, neck pain and neck stiffness.  Skin:  Negative for rash.  Neurological: Negative for dizziness, weakness and headaches.   Per HPI unless specifically indicated above     Objective:    BP 115/80 (BP Location: Left Arm, Patient Position: Sitting, Cuff Size: Large)   Pulse 80   Temp 99.4 F (37.4 C)   Wt 213 lb 3.2 oz (96.7 kg)   LMP  (LMP Unknown)   SpO2 95%   BMI 33.39 kg/m   Wt Readings from Last 3 Encounters:  03/29/16 213 lb 3.2 oz (96.7 kg)  03/23/16 215 lb 3.2 oz (97.6 kg)  03/08/16 214 lb 12.8 oz (97.4 kg)    Physical Exam  Constitutional: She is oriented to person, place, and time. She appears well-developed and well-nourished. No distress.  HENT:  Head: Normocephalic and atraumatic.  Right Ear: External ear normal.  Left Ear: External ear normal.  Mouth/Throat: Oropharynx is clear and moist. No oropharyngeal exudate.  Erythematous nasal turbinates. No drainage, polyps.  Mildly erythematous oropharynx. Bilateral TM visualized and no erythema, bulging, exudates appreciated.   Eyes: Conjunctivae are normal. Right eye exhibits no discharge. Left eye exhibits no discharge.  Neck: Normal range of motion. Neck supple.  Cardiovascular: Normal rate, regular rhythm, normal heart sounds and intact distal pulses.   Pulmonary/Chest: Effort normal and breath sounds normal. No stridor. No respiratory distress. She has no wheezes. She has no rales.  Abdominal: Soft. Bowel sounds are normal. She exhibits no distension. There is no tenderness.  Musculoskeletal: Normal range of motion.  Neurological: She is alert and oriented to person, place,  and time.  Skin: Skin is warm and dry. No rash noted.  Psychiatric: She has a normal mood and affect. Her behavior is normal. Judgment and thought content normal.       Assessment & Plan:   Problem List Items Addressed This Visit    None    Visit Diagnoses    Acute non-recurrent sinusitis, unspecified location    -  Primary   Prescribed Amoxicillin as this has worked for  patient in the past. Advised use of [employer], [place], and daily Zyrtec or Claritin for allergic symptoms.   Relevant Medications   amoxicillin (AMOXIL) 875 MG tablet       Follow up plan: Return if symptoms worsen or fail to improve.

## 2016-04-06 ENCOUNTER — Ambulatory Visit: Payer: Medicare Other | Admitting: Unknown Physician Specialty

## 2016-04-08 DIAGNOSIS — E119 Type 2 diabetes mellitus without complications: Secondary | ICD-10-CM | POA: Diagnosis not present

## 2016-04-08 DIAGNOSIS — I10 Essential (primary) hypertension: Secondary | ICD-10-CM | POA: Diagnosis not present

## 2016-04-19 ENCOUNTER — Ambulatory Visit: Payer: BC Managed Care – PPO

## 2016-04-26 ENCOUNTER — Ambulatory Visit: Payer: BC Managed Care – PPO

## 2016-04-27 ENCOUNTER — Ambulatory Visit (INDEPENDENT_AMBULATORY_CARE_PROVIDER_SITE_OTHER): Payer: Medicare Other | Admitting: Unknown Physician Specialty

## 2016-04-27 ENCOUNTER — Encounter: Payer: Self-pay | Admitting: Unknown Physician Specialty

## 2016-04-27 VITALS — BP 122/78 | HR 80 | Temp 98.5°F | Wt 213.2 lb

## 2016-04-27 DIAGNOSIS — E119 Type 2 diabetes mellitus without complications: Secondary | ICD-10-CM

## 2016-04-27 DIAGNOSIS — I10 Essential (primary) hypertension: Secondary | ICD-10-CM

## 2016-04-27 LAB — BAYER DCA HB A1C WAIVED: HB A1C: 6 % (ref <7.0)

## 2016-04-27 MED ORDER — LISINOPRIL 5 MG PO TABS: 5.0000 mg | ORAL_TABLET | Freq: Every day | ORAL | 3 refills | Status: DC

## 2016-04-27 NOTE — Assessment & Plan Note (Signed)
Stable, continue present medications.   

## 2016-04-27 NOTE — Progress Notes (Signed)
BP 122/78   Pulse 80   Temp 98.5 F (36.9 C)   Wt 213 lb 3.2 oz (96.7 kg)   LMP  (LMP Unknown)   SpO2 97%   BMI 33.39 kg/m    Subjective:    Patient ID: Allison Phillips, female    DOB: Sep 23, 1950, 66 y.o.   MRN: [id number]  HPI: Allison Phillips is a 66 y.o. female  Chief Complaint  Patient presents with  . Hypertension  . Diabetes    pt states she had eye exam at [facility] last month, will fax form to them    Pt is here to follow up her elevated blood sugar and Hgb A1C.  [facility]    Diabetes: Using medications without difficulties No hypoglycemic episodes No hyperglycemic episodes Feet problems:none Blood Sugars averaging: Not checking eye exam within last year Last Hgb A1C:6.5  Hypertension  Using medications without difficulty Average home BPs Not checking   Using medication without problems or lightheadedness No chest pain with exertion or shortness of breath No Edema  Elevated Cholesterol Using medications without problems No Muscle aches  Diet: Cutting back on "fried stuff" Exercise: Not exercising  Relevant past medical, surgical, family and social history reviewed and updated as indicated. Interim medical history since our last visit reviewed. Allergies and medications reviewed and updated.  Review of Systems  Per HPI unless specifically indicated above     Objective:    BP 122/78   Pulse 80   Temp 98.5 F (36.9 C)   Wt 213 lb 3.2 oz (96.7 kg)   LMP  (LMP Unknown)   SpO2 97%   BMI 33.39 kg/m   Wt Readings from Last 3 Encounters:  04/27/16 213 lb 3.2 oz (96.7 kg)  03/29/16 213 lb 3.2 oz (96.7 kg)  03/23/16 215 lb 3.2 oz (97.6 kg)    Physical Exam  Constitutional: She is oriented to person, place, and time. She appears well-developed and well-nourished. No distress.  HENT:  Head: Normocephalic and atraumatic.  Eyes: Conjunctivae and lids are normal. Right eye exhibits no discharge.  Left eye exhibits no discharge. No scleral icterus.  Neck: Normal range of motion. Neck supple. No JVD present. Carotid bruit is not present.  Cardiovascular: Normal rate, regular rhythm and normal heart sounds.   Pulmonary/Chest: Effort normal and breath sounds normal.  Abdominal: Normal appearance. There is no splenomegaly or hepatomegaly.  Musculoskeletal: Normal range of motion.  Neurological: She is alert and oriented to person, place, and time.  Skin: Skin is warm, dry and intact. No rash noted. No pallor.  Psychiatric: She has a normal mood and affect. Her behavior is normal. Judgment and thought content normal.    Results for orders placed or performed in visit on 04/27/16  Bayer DCA Hb A1c Waived  Result Value Ref Range   Bayer DCA Hb A1c Waived 6.0 <7.0 %      Assessment & Plan:   Problem List Items Addressed This Visit      Unprioritized   Controlled type 2 diabetes mellitus without complication, without long-term current use of insulin ([place]) - Primary    Stable with Hgb A1C 6.2.          Relevant Medications   lisinopril (PRINIVIL,ZESTRIL) 5 MG tablet   Other Relevant Orders   Bayer DCA Hb A1c Waived (Completed)   Essential hypertension, benign    Stable, continue present medications.  Relevant Medications   lisinopril (PRINIVIL,ZESTRIL) 5 MG tablet   Other Relevant Orders   Comprehensive metabolic panel   Lipid Panel w/o Chol/HDL Ratio       Follow up plan: Return in about 6 months (around 10/27/2016).

## 2016-04-27 NOTE — Assessment & Plan Note (Addendum)
Stable with Hgb A1C 6.2.

## 2016-04-28 ENCOUNTER — Encounter: Payer: Self-pay | Admitting: Unknown Physician Specialty

## 2016-04-28 LAB — COMPREHENSIVE METABOLIC PANEL WITH GFR
ALT: 11 IU/L (ref 0–32)
AST: 15 IU/L (ref 0–40)
Albumin/Globulin Ratio: 1.8 (ref 1.2–2.2)
Albumin: 4.2 g/dL (ref 3.6–4.8)
Alkaline Phosphatase: 54 IU/L (ref 39–117)
BUN/Creatinine Ratio: 22 (ref 12–28)
BUN: 20 mg/dL (ref 8–27)
Bilirubin Total: 0.3 mg/dL (ref 0.0–1.2)
CO2: 26 mmol/L (ref 18–29)
Calcium: 9 mg/dL (ref 8.7–10.3)
Chloride: 102 mmol/L (ref 96–106)
Creatinine, Ser: 0.92 mg/dL (ref 0.57–1.00)
GFR calc Af Amer: 76 mL/min/1.73 (ref >59)
GFR calc non Af Amer: 66 mL/min/1.73 (ref >59)
Globulin, Total: 2.4 g/dL (ref 1.5–4.5)
Glucose: 92 mg/dL (ref 65–99)
Potassium: 4.5 mmol/L (ref 3.5–5.2)
Sodium: 142 mmol/L (ref 134–144)
Total Protein: 6.6 g/dL (ref 6.0–8.5)

## 2016-04-28 LAB — LIPID PANEL W/O CHOL/HDL RATIO
Cholesterol, Total: 154 mg/dL (ref 100–199)
HDL: 65 mg/dL (ref >39)
LDL Calculated: 53 mg/dL (ref 0–99)
TRIGLYCERIDES: 179 mg/dL — AB (ref 0–149)
VLDL Cholesterol Cal: 36 mg/dL (ref 5–40)

## 2016-04-28 NOTE — Progress Notes (Signed)
Patient notified of results by phone.

## 2016-05-03 ENCOUNTER — Ambulatory Visit: Payer: BC Managed Care – PPO

## 2016-05-17 ENCOUNTER — Ambulatory Visit: Payer: BC Managed Care – PPO

## 2016-05-18 ENCOUNTER — Encounter: Payer: Self-pay | Admitting: Dietician

## 2016-05-18 NOTE — Progress Notes (Signed)
Pt did not come to class 1 on 04-19-16 and rescheduled it  for 05-17-16. Then Pt called and cancelled class 1 on 05-17-16 and does not wish to reschedule at this time.

## 2016-05-31 ENCOUNTER — Ambulatory Visit: Payer: BC Managed Care – PPO

## 2016-07-15 DIAGNOSIS — M542 Cervicalgia: Secondary | ICD-10-CM | POA: Diagnosis not present

## 2016-07-15 DIAGNOSIS — M4692 Unspecified inflammatory spondylopathy, cervical region: Secondary | ICD-10-CM | POA: Diagnosis not present

## 2016-07-15 DIAGNOSIS — M62838 Other muscle spasm: Secondary | ICD-10-CM | POA: Diagnosis not present

## 2016-07-23 ENCOUNTER — Ambulatory Visit (INDEPENDENT_AMBULATORY_CARE_PROVIDER_SITE_OTHER): Payer: Medicare Other | Admitting: Family Medicine

## 2016-07-23 ENCOUNTER — Encounter: Payer: Self-pay | Admitting: Family Medicine

## 2016-07-23 ENCOUNTER — Telehealth: Payer: Self-pay

## 2016-07-23 VITALS — BP 126/74 | HR 83 | Temp 97.9°F | Resp 16 | Ht 67.0 in | Wt 211.6 lb

## 2016-07-23 DIAGNOSIS — R5383 Other fatigue: Secondary | ICD-10-CM

## 2016-07-23 DIAGNOSIS — G5713 Meralgia paresthetica, bilateral lower limbs: Secondary | ICD-10-CM | POA: Diagnosis not present

## 2016-07-23 DIAGNOSIS — M7062 Trochanteric bursitis, left hip: Secondary | ICD-10-CM | POA: Diagnosis not present

## 2016-07-23 DIAGNOSIS — R102 Pelvic and perineal pain: Secondary | ICD-10-CM | POA: Diagnosis not present

## 2016-07-23 DIAGNOSIS — Z720 Tobacco use: Secondary | ICD-10-CM | POA: Diagnosis not present

## 2016-07-23 LAB — CBC WITH DIFFERENTIAL/PLATELET
BASOS ABS: 65 {cells}/uL (ref 0–200)
BASOS PCT: 1 %
EOS ABS: 130 {cells}/uL (ref 15–500)
Eosinophils Relative: 2 %
HCT: 40 % (ref 35.0–45.0)
HEMOGLOBIN: 13.1 g/dL (ref 11.7–15.5)
LYMPHS ABS: 2535 {cells}/uL (ref 850–3900)
Lymphocytes Relative: 39 %
MCH: 28.5 pg (ref 27.0–33.0)
MCHC: 32.8 g/dL (ref 32.0–36.0)
MCV: 87 fL (ref 80.0–100.0)
MONO ABS: 390 {cells}/uL (ref 200–950)
MPV: 10.3 fL (ref 7.5–12.5)
Monocytes Relative: 6 %
NEUTROS ABS: 3380 {cells}/uL (ref 1500–7800)
Neutrophils Relative %: 52 %
Platelets: 307 10*3/uL (ref 140–400)
RBC: 4.6 MIL/uL (ref 3.80–5.10)
RDW: 14.5 % (ref 11.0–15.0)
WBC: 6.5 10*3/uL (ref 3.8–10.8)

## 2016-07-23 LAB — COMPLETE METABOLIC PANEL WITH GFR
ALBUMIN: 4.2 g/dL (ref 3.6–5.1)
ALK PHOS: 52 U/L (ref 33–130)
ALT: 9 U/L (ref 6–29)
AST: 13 U/L (ref 10–35)
BILIRUBIN TOTAL: 0.3 mg/dL (ref 0.2–1.2)
BUN: 12 mg/dL (ref 7–25)
CO2: 28 mmol/L (ref 20–31)
CREATININE: 0.83 mg/dL (ref 0.50–0.99)
Calcium: 9.3 mg/dL (ref 8.6–10.4)
Chloride: 102 mmol/L (ref 98–110)
GFR, EST AFRICAN AMERICAN: 86 mL/min (ref >=60)
GFR, EST NON AFRICAN AMERICAN: 74 mL/min (ref >=60)
GLUCOSE: 92 mg/dL (ref 65–99)
Potassium: 4.5 mmol/L (ref 3.5–5.3)
Sodium: 139 mmol/L (ref 135–146)
TOTAL PROTEIN: 6.6 g/dL (ref 6.1–8.1)

## 2016-07-23 LAB — TSH: TSH: 0.85 mIU/L

## 2016-07-23 MED ORDER — VARENICLINE TARTRATE 0.5 MG X 11 & 1 MG X 42 PO MISC: ORAL | 0 refills | Status: DC

## 2016-07-23 NOTE — Progress Notes (Signed)
BP 126/74   Pulse 83   Temp 97.9 F (36.6 C) (Oral)   Resp 16   Ht 5\' 7"  (1.702 m)   Wt 211 lb 9.6 oz (96 kg)   LMP  (LMP Unknown)   SpO2 96%   BMI 33.14 kg/m    Subjective:    Patient ID: Allison Phillips, female    DOB: February 24, 1950, 66 y.o.   MRN: [id number]  HPI: Allison Phillips is a 66 y.o. female  Chief Complaint  Patient presents with  . Abdominal Pain    it on and off  LLQ  . Hip Pain    LEFT VERY SORE  . Nicotine Dependence    WANTS RX FOR CHANTIX    HPI Some days, her stomach might hurt all day long; other days, stomach hurts all day long Pain going on for a month;  Ate some pecans and then thought about the diverticulitis Reviewed her last colonoscopy report, 12/18/14; diverticular disease; next due in 5 years Wonders if cologuard is appropriate; last colonoscopy was at least 2 years; done in Nov 2016 Lower part of the stomach Still has female organs; no bloating Just occasional constipation, usually regular No blood in stools or dark stools; then recalls seeing a dark stool but after eating dark greens, leafy greens Has had some mucous in the stools No unexpected weight loss; no night sweats No vaginal bleeding No change in color or odor of urine; no blood in the urine, no dysuria Pain in the shoulder blade, working with therapist on that Sometimes has some fatigue She also has left hip pain; very sore; cannot lay on it at night; size of the hand on the side; in no catching or clunking; does have some numbness in the front of the legs at times, well over a year ago; left leg started first, now both right on the front, not every day; like numbed tooth; cannot think of anything that precipitates that  Depression screen [facility] 2/9 07/23/2016 03/23/2016 11/26/2015 11/25/2014  Decreased Interest 0 0 1 0  Down, Depressed, Hopeless 0 0 1 0  PHQ - 2 Score 0 0 2 0  Altered sleeping - - 0 -  Tired, decreased energy - - 1 -  Change in appetite - - 0 -  Feeling bad or  failure about yourself  - - 0 -  Trouble concentrating - - 0 -  Moving slowly or fidgety/restless - - 0 -  Suicidal thoughts - - 0 -  PHQ-9 Score - - 3 -    Relevant past medical, surgical, family and social history reviewed Past Medical History:  Diagnosis Date  . Bursitis of left hip   . Depression   . Diverticulitis   . Hypertension   . Joint pain   . Obesity   . Osteoporosis   . Prediabetes   . Reflux   . Tobacco use    Past Surgical History:  Procedure Laterality Date  . ANAL FISSURE REPAIR    . APPENDECTOMY    . [place]  . COLONOSCOPY WITH PROPOFOL N/A 12/18/2014   Procedure: COLONOSCOPY WITH PROPOFOL;  Surgeon: Christene Lye, MD;  Location: [facility] ENDOSCOPY;  Service: Endoscopy;  Laterality: N/A;  . LIPOMA EXCISION  07/2015   right shoulder  . LIPOMA EXCISION  11/19/2015  . OOPHORECTOMY     for "fibroids" not sure which ovary   Family History  Problem Relation Age of Onset  . Hypertension Mother   .  Kidney disease Mother   . Alzheimer's disease Mother    Social History   Social History  . Marital status: Married    Spouse name: N/A  . Number of children: N/A  . Years of education: N/A   Occupational History  . Not on file.   Social History Main Topics  . Smoking status: Current Every Day Smoker    Packs/day: 0.50    Years: 15.00    Types: Cigarettes  . Smokeless tobacco: Never Used  . Alcohol use 2.4 oz/week    4 Standard drinks or equivalent per week     Comment: on occasion  . Drug use: No  . Sexual activity: Yes   Other Topics Concern  . Not on file   Social History Narrative  . No narrative on file    Interim medical history since last visit reviewed. Allergies and medications reviewed  Review of Systems Per HPI unless specifically indicated above     Objective:    BP 126/74   Pulse 83   Temp 97.9 F (36.6 C) (Oral)   Resp 16   Ht 5\' 7"  (1.702 m)   Wt 211 lb 9.6 oz (96 kg)   LMP  (LMP Unknown)    SpO2 96%   BMI 33.14 kg/m   Wt Readings from Last 3 Encounters:  07/23/16 211 lb 9.6 oz (96 kg)  04/27/16 213 lb 3.2 oz (96.7 kg)  03/29/16 213 lb 3.2 oz (96.7 kg)    Physical Exam  Constitutional: She appears well-developed and well-nourished.  HENT:  Mouth/Throat: Mucous membranes are normal.  Eyes: EOM are normal. No scleral icterus.  Cardiovascular: Normal rate and regular rhythm.   Pulmonary/Chest: Effort normal and breath sounds normal.  Abdominal: There is tenderness in the right lower quadrant, suprapubic area and left lower quadrant. There is no guarding.  Musculoskeletal:       Left hip: Bony tenderness: over greater trochanter.  Psychiatric: She has a normal mood and affect. Her behavior is normal.       Assessment & Plan:   Problem List Items Addressed This Visit      Musculoskeletal and Integument   Bursitis of left hip     Other   Tobacco use    Discussed different forms of smoking cessation with the patient, and I encouraged cessation. Patient requests trial of Chantix.  I discussed common side effects including but not limited to nausea/vomiting, vivid dreams, behaviour changes, depression, suicidal ideation; also explained possible increased cardiovascular risk. After discussion, patient opts to try quitting with Chantix. The 1-800-QUIT-NOW number given in patient instructions. Patient was encouraged to choose a quit date about 1 week after starting medicine.        Other Visit Diagnoses    Suprapubic abdominal pain    -  Primary   check urine, labs, CT scan abd/pelvis   Relevant Orders   Urinalysis w microscopic + reflex cultur (Completed)   CBC with Differential/Platelet (Completed)   COMPLETE METABOLIC PANEL WITH GFR (Completed)   CT Abdomen Pelvis W Contrast   Meralgia paresthetica, bilateral lower limbs       explained diagnosis (not sure why Chantix is showing up below dx); avoid pressure, compression over offending nerve   Relevant Medications    varenicline (CHANTIX STARTING MONTH PAK) 0.5 MG X 11 & 1 MG X 42 tablet   Other fatigue       Relevant Orders   TSH (Completed)       Follow  up plan: Return in about 4 weeks (around 08/20/2016) for twenty minute follow-up with fasting labs.  An after-visit summary was printed and given to the patient at [place].  Please see the patient instructions which may contain other information and recommendations beyond what is mentioned above in the assessment and plan.  Meds ordered this encounter  Medications  . Echinacea 125 MG CAPS    Sig: Take 125 mg by mouth every other day.  . varenicline (CHANTIX STARTING MONTH PAK) 0.5 MG X 11 & 1 MG X 42 tablet    Sig: Take one 0.5 mg tablet by mouth once daily for 3 days, then increase to one 0.5 mg tablet twice daily for 4 days, then increase to one 1 mg tablet twice daily.    Dispense:  53 tablet    Refill:  0    Orders Placed This Encounter  Procedures  . CT Abdomen Pelvis W Contrast  . Urinalysis w microscopic + reflex cultur  . CBC with Differential/Platelet  . COMPLETE METABOLIC PANEL WITH GFR  . TSH

## 2016-07-23 NOTE — Assessment & Plan Note (Signed)
Discussed different forms of smoking cessation with the patient, and I encouraged cessation. Patient requests trial of Chantix.  I discussed common side effects including but not limited to nausea/vomiting, vivid dreams, behaviour changes, depression, suicidal ideation; also explained possible increased cardiovascular risk. After discussion, patient opts to try quitting with Chantix. The 1-800-QUIT-NOW number given in patient instructions. Patient was encouraged to choose a quit date about 1 week after starting medicine. 

## 2016-07-23 NOTE — Patient Instructions (Addendum)
I believe that the numbness on the thighs may be meralgia paresthetica Avoid constriction around the waist and upper thighs  I believe that you have trochanteric bursitis Avoid pressure over that area Topical ice for 20 minutes 3 times a day for several times should help Try turmeric as a natural anti-inflammatory (for pain and arthritis). It comes in capsules where you buy aspirin and fish oil, but also as a spice where you buy pepper and garlic powder.  I do encourage you to quit smoking Call [phone] to sign up for smoking cessation classes You can call 1-800-QUIT-NOW to talk with a smoking cessation coach  We'll get labs and CT scan If you have not heard anything from my staff in a week about any orders/referrals/studies from today, please contact us here to follow-up [phone]   Steps to Quit Smoking Smoking tobacco can be bad for your health. It can also affect almost every organ in your body. Smoking puts you and people around you at risk for many serious long-lasting (chronic) diseases. Quitting smoking is hard, but it is one of the best things that you can do for your health. It is never too late to quit. What are the benefits of quitting smoking? When you quit smoking, you lower your risk for getting serious diseases and conditions. They can include:  Lung cancer or lung disease.  Heart disease.  Stroke.  Heart attack.  Not being able to have children (infertility).  Weak bones (osteoporosis) and broken bones (fractures).  If you have coughing, wheezing, and shortness of breath, those symptoms may get better when you quit. You may also get sick less often. If you are pregnant, quitting smoking can help to lower your chances of having a baby of low birth weight. What can I do to help me quit smoking? Talk with your doctor about what can help you quit smoking. Some things you can do (strategies) include:  Quitting smoking totally, instead of slowly cutting back  how much you smoke over a period of time.  Going to in-person counseling. You are more likely to quit if you go to many counseling sessions.  Using resources and support systems, such as: ? Database administrator with a Social worker. ? Phone quitlines. ? Careers information officer. ? Support groups or group counseling. ? Text messaging programs. ? Mobile phone apps or applications.  Taking medicines. Some of these medicines may have nicotine in them. If you are pregnant or breastfeeding, do not take any medicines to quit smoking unless your doctor says it is okay. Talk with your doctor about counseling or other things that can help you.  Talk with your doctor about using more than one strategy at the same time, such as taking medicines while you are also going to in-person counseling. This can help make quitting easier. What things can I do to make it easier to quit? Quitting smoking might feel very hard at first, but there is a lot that you can do to make it easier. Take these steps:  Talk to your family and friends. Ask them to support and encourage you.  Call phone quitlines, reach out to support groups, or work with a Social worker.  Ask people who smoke to not smoke around you.  Avoid places that make you want (trigger) to smoke, such as: ? Bars. ? Parties. ? Smoke-break areas at work.  Spend time with people who do not smoke.  Lower the stress in your life. Stress can make you want to  smoke. Try these things to help your stress: ? Getting regular exercise. ? Deep-breathing exercises. ? Yoga. ? Meditating. ? Doing a body scan. To do this, close your eyes, focus on one area of your body at a time from head to toe, and notice which parts of your body are tense. Try to relax the muscles in those areas.  Download or buy apps on your mobile phone or tablet that can help you stick to your quit plan. There are many free apps, such as QuitGuide from the [employer] Office manager for Disease Control and  Prevention). You can find more support from smokefree.gov and other websites.  This information is not intended to replace advice given to you by your health care provider. Make sure you discuss any questions you have with your health care provider. Document Released: 10/31/2008 Document Revised: 09/02/2015 Document Reviewed: 05/21/2014 Elsevier Interactive Patient Education  2018 [employer].

## 2016-07-23 NOTE — Telephone Encounter (Signed)
I called this patient to inform her that she has been scheduled to have her CT on 07/30/16 at 10am [facility]), but she was not in. Her husband took the message and said that he will let her know.

## 2016-07-24 LAB — URINALYSIS W MICROSCOPIC + REFLEX CULTURE
Bacteria, UA: NONE SEEN [HPF]
Bilirubin Urine: NEGATIVE
Casts: NONE SEEN [LPF]
Crystals: NONE SEEN [HPF]
GLUCOSE, UA: NEGATIVE
Hgb urine dipstick: NEGATIVE
Ketones, ur: NEGATIVE
LEUKOCYTES UA: NEGATIVE
NITRITE: NEGATIVE
PH: 6 (ref 5.0–8.0)
Protein, ur: NEGATIVE
RBC / HPF: NONE SEEN RBC/HPF (ref <=2)
SPECIFIC GRAVITY, URINE: 1.011 (ref 1.001–1.035)
WBC, UA: NONE SEEN WBC/HPF (ref <=5)
YEAST: NONE SEEN [HPF]

## 2016-07-26 ENCOUNTER — Telehealth: Payer: Self-pay

## 2016-07-26 NOTE — Telephone Encounter (Signed)
Patient called to confirm the details of her appt and said thanks.

## 2016-07-27 DIAGNOSIS — M542 Cervicalgia: Secondary | ICD-10-CM | POA: Diagnosis not present

## 2016-07-30 ENCOUNTER — Ambulatory Visit
Admission: RE | Admit: 2016-07-30 | Discharge: 2016-07-30 | Disposition: A | Discharge status: Home / Self Care | Payer: Medicare Other | Source: Ambulatory Visit | Attending: Family Medicine | Admitting: Family Medicine

## 2016-07-30 DIAGNOSIS — K5730 Diverticulosis of large intestine without perforation or abscess without bleeding: Secondary | ICD-10-CM | POA: Diagnosis not present

## 2016-07-30 DIAGNOSIS — R102 Pelvic and perineal pain: Secondary | ICD-10-CM

## 2016-07-30 MED ORDER — IOPAMIDOL (ISOVUE-300) INJECTION 61%
100.0000 mL | Freq: Once | INTRAVENOUS | Status: AC | PRN
  Administered 2016-07-30: 100 mL via INTRAVENOUS

## 2016-08-03 DIAGNOSIS — M542 Cervicalgia: Secondary | ICD-10-CM | POA: Diagnosis not present

## 2016-08-10 DIAGNOSIS — M542 Cervicalgia: Secondary | ICD-10-CM | POA: Diagnosis not present

## 2016-08-12 DIAGNOSIS — M542 Cervicalgia: Secondary | ICD-10-CM | POA: Diagnosis not present

## 2016-08-20 ENCOUNTER — Ambulatory Visit: Payer: Medicare Other | Admitting: Family Medicine

## 2016-11-09 ENCOUNTER — Telehealth: Payer: Self-pay | Admitting: Family Medicine

## 2016-11-09 NOTE — Telephone Encounter (Signed)
I think this went to the wrong provider? I have not seen her - I will route this to my MA [place] as well as the sender  Thanks

## 2016-11-09 NOTE — Telephone Encounter (Signed)
Copied from [facility] [id number]. Topic: Inquiry >> Nov 09, 2016 12:51 PM Oliver Pila B wrote: Reason for CRM: PT wants to transfer PCP from Dr. Julian Hy to Dr. Sanda Klein @ [place], please notify PT of decision

## 2016-11-09 NOTE — Telephone Encounter (Signed)
Routing to [employer], not sure what to do with this

## 2016-11-09 NOTE — Telephone Encounter (Signed)
She is already my patient

## 2016-11-11 NOTE — Telephone Encounter (Signed)
Spoke with patient on today and informed her that she was already established with Dr. Sanda Klein and that she was last seen by her on 07/23/16 here at [facility].  Patient said she understand and she was just wanting to make sure.

## 2016-12-03 ENCOUNTER — Other Ambulatory Visit: Payer: Self-pay | Admitting: Family Medicine

## 2016-12-03 DIAGNOSIS — Z1231 Encounter for screening mammogram for malignant neoplasm of breast: Secondary | ICD-10-CM

## 2016-12-06 ENCOUNTER — Ambulatory Visit (INDEPENDENT_AMBULATORY_CARE_PROVIDER_SITE_OTHER): Payer: Medicare Other | Admitting: Family Medicine

## 2016-12-06 ENCOUNTER — Encounter: Payer: Self-pay | Admitting: Family Medicine

## 2016-12-06 VITALS — BP 130/78 | HR 67 | Temp 98.4°F | Ht 66.0 in | Wt 218.2 lb

## 2016-12-06 DIAGNOSIS — Z780 Asymptomatic menopausal state: Secondary | ICD-10-CM

## 2016-12-06 DIAGNOSIS — I10 Essential (primary) hypertension: Secondary | ICD-10-CM

## 2016-12-06 DIAGNOSIS — Z23 Encounter for immunization: Secondary | ICD-10-CM

## 2016-12-06 DIAGNOSIS — Z1159 Encounter for screening for other viral diseases: Secondary | ICD-10-CM

## 2016-12-06 DIAGNOSIS — Z114 Encounter for screening for human immunodeficiency virus [HIV]: Secondary | ICD-10-CM

## 2016-12-06 DIAGNOSIS — Z0000 Encounter for general adult medical examination without abnormal findings: Secondary | ICD-10-CM

## 2016-12-06 DIAGNOSIS — R001 Bradycardia, unspecified: Secondary | ICD-10-CM

## 2016-12-06 DIAGNOSIS — E119 Type 2 diabetes mellitus without complications: Secondary | ICD-10-CM

## 2016-12-06 NOTE — Assessment & Plan Note (Signed)
Discussed one-time hep C screening recommendation for individuals born between 1945-1965 per USPSTF guidelines; patient agrees with testing; Hep C Ab ordered 

## 2016-12-06 NOTE — Assessment & Plan Note (Signed)
Well controlled 

## 2016-12-06 NOTE — Assessment & Plan Note (Signed)
Check A1c; patient not sure if prediabetes or diabetes, believes it is just prediabetes

## 2016-12-06 NOTE — Patient Instructions (Addendum)
You will have your last and likely final pap smear next November You will be due for your next mammogram on or after December 31, 2016 Please do call to schedule your bone density study and mammogram; the number to schedule one at either [facility] or [facility] is [phone] or [phone] You have received the Prevnar vaccine (PCV-13) and you will not need another booster of this for the rest of your life per current ACIP guidelines Next year, you will receive the PPSV-23 booster and that will be the last one of those for the rest of your life  You can get the shingles vaccine called Shingrix at your local pharmacy; it is a 2 part vaccine, one injection followed by the second 2-6 months later  I do recommend yearly flu shots; for individuals who don't want flu shots, try to practice excellent hand hygiene, and avoid nursing homes, day cares, and hospitals during peak flu season; taking additional vitamin C daily during flu/cold season may help boost your immune system too  Try to get 800 to 1000 iu vitamin D3 daily (sun exposure counts) I'll recommend a coated daily 81 mg aspirin  Health Maintenance  Topic Date Due  . FOOT EXAM  02/05/1960  . DEXA SCAN  02/06/2016  . HEMOGLOBIN A1C  10/27/2016  . PNA vac Low Risk Adult (1 of 2 - PCV13) 04/27/2017 (Originally 02/06/2016)  . INFLUENZA VACCINE  05/01/2017 (Originally 08/18/2016)  . MAMMOGRAM  12/31/2016  . OPHTHALMOLOGY EXAM  05/18/2017  . TETANUS/TDAP  10/22/2017  . PAP SMEAR  11/24/2017  . COLONOSCOPY  12/18/2019  . Hepatitis C Screening  Completed  . HIV Screening  Completed    Advance Directive Advance directives are legal documents that let you make choices ahead of time about your health care and medical treatment in case you become unable to communicate for yourself. Advance directives are a way for you to communicate your wishes to family, friends, and health care providers. This can help convey  your decisions about end-of-life care if you become unable to communicate. Discussing and writing advance directives should happen over time rather than all at once. Advance directives can be changed depending on your situation and what you want, even after you have signed the advance directives. If you do not have an advance directive, some states assign family decision makers to act on your behalf based on how closely you are related to them. Each state has its own laws regarding advance directives. You may want to check with your health care provider, attorney, or state representative about the laws in your state. There are different types of advance directives, such as:  Medical power of attorney.  Living will.  Do not resuscitate (DNR) or do not attempt resuscitation (DNAR) order.  Health care proxy and medical power of attorney A health care proxy, also called a health care agent, is a person who is appointed to make medical decisions for you in cases in which you are unable to make the decisions yourself. Generally, people choose someone they know well and trust to represent their preferences. Make sure to ask this person for an agreement to act as your proxy. A proxy may have to exercise judgment in the event of a medical decision for which your wishes are not known. A medical power of attorney is a legal document that names your health care proxy. Depending on the laws in your state, after the document is written, it may  also need to be:  Signed.  Notarized.  Dated.  Copied.  Witnessed.  Incorporated into your medical record.  You may also want to appoint someone to manage your financial affairs in a situation in which you are unable to do so. This is called a durable power of attorney for finances. It is a separate legal document from the durable power of attorney for health care. You may choose the same person or someone different from your health care proxy to act as your agent  in financial matters. If you do not appoint a proxy, or if there is a concern that the proxy is not acting in your best interests, a court-appointed guardian may be designated to act on your behalf. Living will A living will is a set of instructions documenting your wishes about medical care when you cannot express them yourself. Health care providers should keep a copy of your living will in your medical record. You may want to give a copy to family members or friends. To alert caregivers in case of an emergency, you can place a card in your wallet to let them know that you have a living will and where they can find it. A living will is used if you become:  Terminally ill.  Incapacitated.  Unable to communicate or make decisions.  Items to consider in your living will include:  The use or non-use of life-sustaining equipment, such as dialysis machines and breathing machines (ventilators).  A DNR or DNAR order, which is the instruction not to use cardiopulmonary resuscitation (CPR) if breathing or heartbeat stops.  The use or non-use of tube feeding.  Withholding of food and fluids.  Comfort (palliative) care when the goal becomes comfort rather than a cure.  Organ and tissue donation.  A living will does not give instructions for distributing your money and property if you should pass away. It is recommended that you seek the advice of a lawyer when writing a will. Decisions about taxes, beneficiaries, and asset distribution will be legally binding. This process can relieve your family and friends of any concerns surrounding disputes or questions that may come up about the distribution of your assets. DNR or DNAR A DNR or DNAR order is a request not to have CPR in the event that your heart stops beating or you stop breathing. If a DNR or DNAR order has not been made and shared, a health care provider will try to help any patient whose heart has stopped or who has stopped breathing. If you  plan to have surgery, talk with your health care provider about how your DNR or DNAR order will be followed if problems occur. Summary  Advance directives are the legal documents that allow you to make choices ahead of time about your health care and medical treatment in case you become unable to communicate for yourself.  The process of discussing and writing advance directives should happen over time. You can change the advance directives, even after you have signed them.  Advance directives include DNR or DNAR orders, living wills, and designating an agent as your medical power of attorney. This information is not intended to replace advice given to you by your health care provider. Make sure you discuss any questions you have with your health care provider. Document Released: 04/13/2007 Document Revised: 11/24/2015 Document Reviewed: 11/24/2015 Elsevier Interactive Patient Education  2017 [employer] Maintenance for Postmenopausal Women Menopause is a normal process in which your reproductive ability comes  to an end. This process happens gradually over a span of months to years, usually between the ages of 23 and 38. Menopause is complete when you have missed 12 consecutive menstrual periods. It is important to talk with your health care provider about some of the most common conditions that affect postmenopausal women, such as heart disease, cancer, and bone loss (osteoporosis). Adopting a healthy lifestyle and getting preventive care can help to promote your health and wellness. Those actions can also lower your chances of developing some of these common conditions. What should I know about menopause? During menopause, you may experience a number of symptoms, such as:  Moderate-to-severe hot flashes.  Night sweats.  Decrease in sex drive.  Mood swings.  Headaches.  Tiredness.  Irritability.  Memory problems.  Insomnia.  Choosing to treat or not to treat menopausal  changes is an individual decision that you make with your health care provider. What should I know about hormone replacement therapy and supplements? Hormone therapy products are effective for treating symptoms that are associated with menopause, such as hot flashes and night sweats. Hormone replacement carries certain risks, especially as you become older. If you are thinking about using estrogen or estrogen with progestin treatments, discuss the benefits and risks with your health care provider. What should I know about heart disease and stroke? Heart disease, heart attack, and stroke become more likely as you age. This may be due, in part, to the hormonal changes that your body experiences during menopause. These can affect how your body processes dietary fats, triglycerides, and cholesterol. Heart attack and stroke are both medical emergencies. There are many things that you can do to help prevent heart disease and stroke:  Have your blood pressure checked at least every 1-2 years. High blood pressure causes heart disease and increases the risk of stroke.  If you are 82-41 years old, ask your health care provider if you should take aspirin to prevent a heart attack or a stroke.  Do not use any tobacco products, including cigarettes, chewing tobacco, or electronic cigarettes. If you need help quitting, ask your health care provider.  It is important to eat a healthy diet and maintain a healthy weight. ? Be sure to include plenty of vegetables, fruits, low-fat dairy products, and lean protein. ? Avoid eating foods that are high in solid fats, added sugars, or salt (sodium).  Get regular exercise. This is one of the most important things that you can do for your health. ? Try to exercise for at least 150 minutes each week. The type of exercise that you do should increase your heart rate and make you sweat. This is known as moderate-intensity exercise. ? Try to do strengthening exercises at least  twice each week. Do these in addition to the moderate-intensity exercise.  Know your numbers.Ask your health care provider to check your cholesterol and your blood glucose. Continue to have your blood tested as directed by your health care provider.  What should I know about cancer screening? There are several types of cancer. Take the following steps to reduce your risk and to catch any cancer development as early as possible. Breast Cancer  Practice breast self-awareness. ? This means understanding how your breasts normally appear and feel. ? It also means doing regular breast self-exams. Let your health care provider know about any changes, no matter how small.  If you are 75 or older, have a clinician do a breast exam (clinical breast exam or CBE) every  year. Depending on your age, family history, and medical history, it may be recommended that you also have a yearly breast X-ray (mammogram).  If you have a family history of breast cancer, talk with your health care provider about genetic screening.  If you are at high risk for breast cancer, talk with your health care provider about having an MRI and a mammogram every year.  Breast cancer (BRCA) gene test is recommended for women who have family members with BRCA-related cancers. Results of the assessment will determine the need for genetic counseling and BRCA1 and for BRCA2 testing. BRCA-related cancers include these types: ? Breast. This occurs in males or females. ? Ovarian. ? Tubal. This may also be called fallopian tube cancer. ? Cancer of the abdominal or pelvic lining (peritoneal cancer). ? Prostate. ? Pancreatic.  Cervical, Uterine, and Ovarian Cancer Your health care provider may recommend that you be screened regularly for cancer of the pelvic organs. These include your ovaries, uterus, and vagina. This screening involves a pelvic exam, which includes checking for microscopic changes to the surface of your cervix (Pap  test).  For women ages 21-65, health care providers may recommend a pelvic exam and a Pap test every three years. For women ages 28-65, they may recommend the Pap test and pelvic exam, combined with testing for human papilloma virus (HPV), every five years. Some types of HPV increase your risk of cervical cancer. Testing for HPV may also be done on women of any age who have unclear Pap test results.  Other health care providers may not recommend any screening for nonpregnant women who are considered low risk for pelvic cancer and have no symptoms. Ask your health care provider if a screening pelvic exam is right for you.  If you have had past treatment for cervical cancer or a condition that could lead to cancer, you need Pap tests and screening for cancer for at least 20 years after your treatment. If Pap tests have been discontinued for you, your risk factors (such as having a new sexual partner) need to be reassessed to determine if you should start having screenings again. Some women have medical problems that increase the chance of getting cervical cancer. In these cases, your health care provider may recommend that you have screening and Pap tests more often.  If you have a family history of uterine cancer or ovarian cancer, talk with your health care provider about genetic screening.  If you have vaginal bleeding after reaching menopause, tell your health care provider.  There are currently no reliable tests available to screen for ovarian cancer.  Lung Cancer Lung cancer screening is recommended for adults 66-75 years old who are at high risk for lung cancer because of a history of smoking. A yearly low-dose CT scan of the lungs is recommended if you:  Currently smoke.  Have a history of at least 30 pack-years of smoking and you currently smoke or have quit within the past 15 years. A pack-year is smoking an average of one pack of cigarettes per day for one year.  Yearly screening  should:  Continue until it has been 15 years since you quit.  Stop if you develop a health problem that would prevent you from having lung cancer treatment.  Colorectal Cancer  This type of cancer can be detected and can often be prevented.  Routine colorectal cancer screening usually begins at age 22 and continues through age 60.  If you have risk factors for colon  cancer, your health care provider may recommend that you be screened at an earlier age.  If you have a family history of colorectal cancer, talk with your health care provider about genetic screening.  Your health care provider may also recommend using home test kits to check for hidden blood in your stool.  A small camera at the end of a tube can be used to examine your colon directly (sigmoidoscopy or colonoscopy). This is done to check for the earliest forms of colorectal cancer.  Direct examination of the colon should be repeated every 5-10 years until age 60. However, if early forms of precancerous polyps or small growths are found or if you have a family history or genetic risk for colorectal cancer, you may need to be screened more often.  Skin Cancer  Check your skin from head to toe regularly.  Monitor any moles. Be sure to tell your health care provider: ? About any new moles or changes in moles, especially if there is a change in a mole's shape or color. ? If you have a mole that is larger than the size of a pencil eraser.  If any of your family members has a history of skin cancer, especially at a young age, talk with your health care provider about genetic screening.  Always use sunscreen. Apply sunscreen liberally and repeatedly throughout the day.  Whenever you are outside, protect yourself by wearing long sleeves, pants, a wide-brimmed hat, and sunglasses.  What should I know about osteoporosis? Osteoporosis is a condition in which bone destruction happens more quickly than new bone creation. After  menopause, you may be at an increased risk for osteoporosis. To help prevent osteoporosis or the bone fractures that can happen because of osteoporosis, the following is recommended:  If you are 43-74 years old, get at least 1,000 mg of calcium and at least 600 mg of vitamin D per day.  If you are older than age 43 but younger than age 59, get at least 1,200 mg of calcium and at least 600 mg of vitamin D per day.  If you are older than age 100, get at least 1,200 mg of calcium and at least 800 mg of vitamin D per day.  Smoking and excessive alcohol intake increase the risk of osteoporosis. Eat foods that are rich in calcium and vitamin D, and do weight-bearing exercises several times each week as directed by your health care provider. What should I know about how menopause affects my mental health? Depression may occur at any age, but it is more common as you become older. Common symptoms of depression include:  Low or sad mood.  Changes in sleep patterns.  Changes in appetite or eating patterns.  Feeling an overall lack of motivation or enjoyment of activities that you previously enjoyed.  Frequent crying spells.  Talk with your health care provider if you think that you are experiencing depression. What should I know about immunizations? It is important that you get and maintain your immunizations. These include:  Tetanus, diphtheria, and pertussis (Tdap) booster vaccine.  Influenza every year before the flu season begins.  Pneumonia vaccine.  Shingles vaccine.  Your health care provider may also recommend other immunizations. This information is not intended to replace advice given to you by your health care provider. Make sure you discuss any questions you have with your health care provider. Document Released: 02/26/2005 Document Revised: 07/25/2015 Document Reviewed: 10/08/2014 Elsevier Interactive Patient Education  2018 [employer].

## 2016-12-06 NOTE — Progress Notes (Signed)
Patient: Allison Phillips, Female    DOB: Feb 26, 1950, 66 y.o.   MRN: [id number]  Visit Date: 12/07/2016  Today's Provider: Enid Derry, MD   Chief Complaint  Patient presents with  . Medicare Wellness    Subjective:   Allison Phillips is a 66 y.o. female who presents today for her Subsequent Annual Wellness Visit.  Caregiver input:  N/a Vaccines: discussed USPSTF grade A and B recommendations Depression:  Depression screen [facility] 2/9 12/06/2016 07/23/2016 03/23/2016 11/26/2015 11/25/2014  Decreased Interest 0 0 0 1 0  Down, Depressed, Hopeless 0 0 0 1 0  PHQ - 2 Score 0 0 0 2 0  Altered sleeping - - - 0 -  Tired, decreased energy - - - 1 -  Change in appetite - - - 0 -  Feeling bad or failure about yourself  - - - 0 -  Trouble concentrating - - - 0 -  Moving slowly or fidgety/restless - - - 0 -  Suicidal thoughts - - - 0 -  PHQ-9 Score - - - 3 -   Hypertension: BP Readings from Last 3 Encounters:  12/06/16 130/78  07/23/16 126/74  04/27/16 122/78   Obesity: Wt Readings from Last 3 Encounters:  12/06/16 218 lb 3.2 oz (99 kg)  07/23/16 211 lb 9.6 oz (96 kg)  04/27/16 213 lb 3.2 oz (96.7 kg)   BMI Readings from Last 3 Encounters:  12/06/16 35.22 kg/m  07/23/16 33.14 kg/m  04/27/16 33.39 kg/m     Skin cancer: large mole on the back; getting bigger,more to the left; no bleeding, just can tell it's there; Dr. Jamal Collin removed a mole years ago, crevice of leg, not cancerous Lung cancer:  n/a Breast cancer: no lumps or bumps; mammo Dec Colorectal cancer: 2016; next five years later  BRCA gene screening: family hx of breast and/or ovarian cancer and/or metastatic prostate cancer? no Cervical cancer screening: still has cervix; no hx of abnormal paps HIV, hep B, hep C: ordered STD testing and prevention (chl/gon/syphilis): not intersted Intimate partner violence: no abuse Contraception: n/a Osteoporosis: long time since last DEXA Fall prevention/vitamin D:  discussed  Diet: green leafy veggies suggested; not drinking enough water;  Exercise: has a treadmill spot ready Alcohol: less than 7 Tobacco use: quit in October; smoked for 15 years total, 1/2 ppd Aspirin: never brought it up Lipids:  Lab Results  Component Value Date   CHOL 154 04/27/2016   CHOL 159 11/26/2015   CHOL 152 11/25/2014   Lab Results  Component Value Date   HDL 65 04/27/2016   HDL 62 11/26/2015   HDL 63 11/25/2014   Lab Results  Component Value Date   LDLCALC 53 04/27/2016   LDLCALC 69 11/26/2015   LDLCALC 58 11/25/2014   Lab Results  Component Value Date   TRIG 179 (H) 04/27/2016   TRIG 141 11/26/2015   TRIG 154 (H) 11/25/2014   No results found for: CHOLHDL No results found for: LDLDIRECT Glucose: A1c 6.5 in Feb 2018, then 6 in April Glucose  Date Value Ref Range Status  04/27/2016 92 65 - 99 mg/dL Final  11/26/2015 125 (H) 65 - 99 mg/dL Final  11/25/2014 109 (H) 65 - 99 mg/dL Final   Glucose, Bld  Date Value Ref Range Status  07/23/2016 92 65 - 99 mg/dL Final   AAA: no fam hx  Care team: [facility], Dr. Jomarie Longs HPI  Review of Systems  Constitutional: Negative for unexpected weight change.  HENT: Negative for hearing loss.   Eyes: Negative for visual disturbance.  Respiratory: Negative for cough and wheezing.   Cardiovascular: Negative for chest pain.  Gastrointestinal: Negative for blood in stool.  Endocrine: Negative for polydipsia.  Genitourinary: Negative for hematuria.  Musculoskeletal:       Middle finger right hand, used wrap and rub; still puffy; left hip bursitis; taking turmeric  Skin:       Check mole on the back  Allergic/Immunologic: Negative for food allergies.  Neurological: Negative for tremors.  Hematological: Negative for adenopathy. Does not bruise/bleed easily.  Psychiatric/Behavioral: Negative for dysphoric mood.    Past Medical History:  Diagnosis Date  . Bursitis of left hip   . Depression   .  Diverticulitis   . Hypertension   . Joint pain   . Obesity   . Osteoporosis   . Prediabetes   . Reflux   . Tobacco use     Past Surgical History:  Procedure Laterality Date  . ANAL FISSURE REPAIR    . APPENDECTOMY    . [place]  . COLONOSCOPY WITH PROPOFOL N/A 12/18/2014   Procedure: COLONOSCOPY WITH PROPOFOL;  Surgeon: Christene Lye, MD;  Location: [facility] ENDOSCOPY;  Service: Endoscopy;  Laterality: N/A;  . LIPOMA EXCISION  07/2015   right shoulder  . LIPOMA EXCISION  11/19/2015  . OOPHORECTOMY     for "fibroids" not sure which ovary    Family History  Problem Relation Age of Onset  . Hypertension Mother   . Kidney disease Mother   . Alzheimer's disease Mother     Social History   Socioeconomic History  . Marital status: Married    Spouse name: Not on file  . Number of children: Not on file  . Years of education: Not on file  . Highest education level: Not on file  Social Needs  . Financial resource strain: Not on file  . Food insecurity - worry: Not on file  . Food insecurity - inability: Not on file  . Transportation needs - medical: Not on file  . Transportation needs - non-medical: Not on file  Occupational History  . Not on file  Tobacco Use  . Smoking status: Former Smoker    Packs/day: 0.50    Years: 15.00    Pack years: 7.50    Types: Cigarettes    Last attempt to quit: 10/24/2016    Years since quitting: 0.1  . Smokeless tobacco: Never Used  Substance and Sexual Activity  . Alcohol use: Yes    Alcohol/week: 2.4 oz    Types: 4 Standard drinks or equivalent per week    Comment: on occasion  . Drug use: No  . Sexual activity: Yes  Other Topics Concern  . Not on file  Social History Narrative  . Not on file    Outpatient Encounter Medications as of 12/06/2016  Medication Sig Note  . Cyanocobalamin (B-12) 500 MCG TABS Take 5,000 mcg daily by mouth.   . Echinacea 125 MG CAPS Take 400 mg as needed by mouth.    . Multiple  Vitamin (MULTIVITAMIN) tablet Take 1 tablet by mouth daily.   . Omega-3 Fatty Acids (FISH OIL PO) Take 1,200 mg daily by mouth.    Marland Kitchen lisinopril (PRINIVIL,ZESTRIL) 5 MG tablet Take 5 mg daily by mouth.  12/06/2016: Has never taken  . [DISCONTINUED] varenicline (CHANTIX STARTING MONTH PAK) 0.5 MG X 11 & 1 MG X 42 tablet Take one 0.5 mg tablet  by mouth once daily for 3 days, then increase to one 0.5 mg tablet twice daily for 4 days, then increase to one 1 mg tablet twice daily.    No facility-administered encounter medications on file as of 12/06/2016.     Functional Ability / Safety Screening 1.  Was the timed Get Up and Go test longer than 12 seconds?  no 2.  Does the patient need help with the phone, transportation, shopping,      preparing meals, housework, laundry, medications, or managing money?  no 3.  Does the patient's home have:  loose throw rugs in the hallway?   no      Grab bars in the bathroom? yes      Handrails on the stairs?   yes      Poor lighting?   no 4.  Has the patient noticed any hearing difficulties?   no  Fall Risk Assessment See under rooming  Depression Screen See under rooming Depression screen [facility] 2/9 12/06/2016 07/23/2016 03/23/2016 11/26/2015 11/25/2014  Decreased Interest 0 0 0 1 0  Down, Depressed, Hopeless 0 0 0 1 0  PHQ - 2 Score 0 0 0 2 0  Altered sleeping - - - 0 -  Tired, decreased energy - - - 1 -  Change in appetite - - - 0 -  Feeling bad or failure about yourself  - - - 0 -  Trouble concentrating - - - 0 -  Moving slowly or fidgety/restless - - - 0 -  Suicidal thoughts - - - 0 -  PHQ-9 Score - - - 3 -    Advanced Directives Does patient have a HCPOA?    no , no formal paperwork If yes, name and contact information: spouse, Clista Rainford [phone] Does patient have a living will or MOST form?  no    Objective:   Vitals: BP 130/78 (BP Location: Right Arm, Patient Position: Sitting, Cuff Size: Large)   Pulse 67   Temp 98.4 F (36.9 C)  (Oral)   Ht '5\' 6"'$  (1.676 m)   Wt 218 lb 3.2 oz (99 kg)   LMP  (LMP Unknown)   SpO2 94%   BMI 35.22 kg/m  Body mass index is 35.22 kg/m. No exam data present  Physical Exam  Constitutional: She appears well-developed and well-nourished.  HENT:  Head: Normocephalic and atraumatic.  Right Ear: Hearing, tympanic membrane, external ear and ear canal normal.  Left Ear: Hearing, tympanic membrane, external ear and ear canal normal.  Eyes: Conjunctivae and EOM are normal. Right eye exhibits no hordeolum. Left eye exhibits no hordeolum. No scleral icterus.  Neck: Carotid bruit is not present. No thyromegaly present.  Cardiovascular: Regular rhythm, S1 normal, S2 normal and normal heart sounds.  No extrasystoles are present. Bradycardia present.  Pulmonary/Chest: Effort normal and breath sounds normal. No respiratory distress. Right breast exhibits no inverted nipple, no mass, no nipple discharge, no skin change and no tenderness. Left breast exhibits no inverted nipple, no mass, no nipple discharge, no skin change and no tenderness. Breasts are symmetrical.  Abdominal: Soft. Normal appearance and bowel sounds are normal. She exhibits no distension, no abdominal bruit, no pulsatile midline mass and no mass. There is no hepatosplenomegaly. There is no tenderness. No hernia.  Musculoskeletal: Normal range of motion. She exhibits no edema.  Lymphadenopathy:       Head (right side): No submandibular adenopathy present.       Head (left side): No submandibular adenopathy present.  She has no cervical adenopathy.    She has no axillary adenopathy.  Neurological: She is alert. She displays no tremor. No cranial nerve deficit. She exhibits normal muscle tone. Gait normal.  Reflex Scores:      Patellar reflexes are 1+ on the right side and 1+ on the left side. No tics  Skin: Skin is warm and dry. No bruising and no ecchymosis noted. No cyanosis. No pallor.  Psychiatric: Her speech is normal and  behavior is normal. Thought content normal. Her mood appears not anxious. She does not exhibit a depressed mood.   Diabetic Foot Form - Detailed   Diabetic Foot Exam - detailed Diabetic Foot exam was performed with the following findings:  Yes 12/06/2016  4:10 PM  Visual Foot Exam completed.:  Yes  Pulse Foot Exam completed.:  Yes  Right Dorsalis Pedis:  Present Left Dorsalis Pedis:  Present  Sensory Foot Exam Completed.:  Yes Semmes-Weinstein Monofilament Test R Site 1-Great Toe:  Pos L Site 1-Great Toe:  Pos       Mood/affect:  euthymic Appearance:  Neatly dressed, good hygiene  6CIT Screen 12/06/2016  What Year? 0 points  What month? 0 points  What time? 0 points  Count back from 20 0 points  Months in reverse 0 points  Repeat phrase 0 points  Total Score 0    Assessment & Plan:     Annual Wellness Visit  Reviewed patient's Family Medical History Reviewed and updated list of patient's medical providers Assessment of cognitive impairment was done Assessed patient's functional ability Established a written schedule for health screening [place] Completed and Reviewed  Exercise Activities and Dietary recommendations Goals    . Weight (lb) < 200 lb (90.7 kg)       Immunization History  Administered Date(s) Administered  . Pneumococcal Conjugate-13 12/06/2016  . Pneumococcal Polysaccharide-23 11/02/2010  . Td 10/23/2007  . Zoster 11/16/2011    Health Maintenance  Topic Date Due  . DEXA SCAN  02/06/2016  . HEMOGLOBIN A1C  10/27/2016  . INFLUENZA VACCINE  05/01/2017 (Originally 08/18/2016)  . MAMMOGRAM  12/31/2016  . OPHTHALMOLOGY EXAM  05/18/2017  . TETANUS/TDAP  10/22/2017  . PAP SMEAR  11/24/2017  . FOOT EXAM  12/06/2017  . PNA vac Low Risk Adult (2 of 2 - PPSV23) 12/06/2017  . COLONOSCOPY  12/18/2019  . Hepatitis C Screening  Completed  . HIV Screening  Completed    Discussed health benefits of physical activity, and encouraged  her to engage in regular exercise appropriate for her age and condition.   Meds ordered this encounter  Medications  . lisinopril (PRINIVIL,ZESTRIL) 5 MG tablet    Sig: Take 5 mg daily by mouth.   . Cyanocobalamin (B-12) 500 MCG TABS    Sig: Take 5,000 mcg daily by mouth.    Current Outpatient Medications:  .  Cyanocobalamin (B-12) 500 MCG TABS, Take 5,000 mcg daily by mouth., Disp: , Rfl:  .  Echinacea 125 MG CAPS, Take 400 mg as needed by mouth. , Disp: , Rfl:  .  Multiple Vitamin (MULTIVITAMIN) tablet, Take 1 tablet by mouth daily., Disp: , Rfl:  .  Omega-3 Fatty Acids (FISH OIL PO), Take 1,200 mg daily by mouth. , Disp: , Rfl:  .  lisinopril (PRINIVIL,ZESTRIL) 5 MG tablet, Take 5 mg daily by mouth. , Disp: , Rfl:  Medications Discontinued During This Encounter  Medication Reason  . varenicline (CHANTIX STARTING MONTH PAK) 0.5 MG X 11 &  1 MG X 42 tablet Patient has not taken in last 30 days    Next Medicare Wellness Visit in 12+ months  Problem List Items Addressed This Visit      Cardiovascular and Mediastinum   Essential hypertension, benign    Well-controlled      Relevant Medications   lisinopril (PRINIVIL,ZESTRIL) 5 MG tablet     Endocrine   Controlled type 2 diabetes mellitus without complication, without long-term current use of insulin (HCC)    Check A1c; patient not sure if prediabetes or diabetes, believes it is just prediabetes      Relevant Medications   lisinopril (PRINIVIL,ZESTRIL) 5 MG tablet   Other Relevant Orders   Microalbumin / creatinine urine ratio   Lipid panel   Hemoglobin A1c   COMPLETE METABOLIC PANEL WITH GFR     Other   Encounter for hepatitis C screening test for low risk patient    Discussed one-time hep C screening recommendation for individuals born between 1945-1965 per USPSTF guidelines; patient agrees with testing; Hep C Ab ordered       Relevant Orders   Hepatitis C Antibody   Bradycardia    Check TSH with other labs       Relevant Orders   TSH   Preventative health care - Primary    USPSTF grade A and B recommendations reviewed with patient; age-appropriate recommendations, preventive care, screening tests, etc discussed and encouraged; healthy living encouraged; see AVS for patient education given to patient        Other Visit Diagnoses    Postmenopausal status       Relevant Orders   DG Bone Density   Need for pneumococcal vaccine       Relevant Orders   Pneumococcal conjugate vaccine 13-valent (Completed)   Pneumococcal conjugate vaccine 13-valent IM   Encounter for screening for HIV       Relevant Orders   HIV antibody

## 2016-12-06 NOTE — Assessment & Plan Note (Signed)
Check TSH with other labs

## 2016-12-07 ENCOUNTER — Encounter: Payer: Self-pay | Admitting: Family Medicine

## 2016-12-07 DIAGNOSIS — Z0000 Encounter for general adult medical examination without abnormal findings: Secondary | ICD-10-CM | POA: Insufficient documentation

## 2016-12-07 NOTE — Assessment & Plan Note (Signed)
USPSTF grade A and B recommendations reviewed with patient; age-appropriate recommendations, preventive care, screening tests, etc discussed and encouraged; healthy living encouraged; see AVS for patient education given to patient  

## 2016-12-24 ENCOUNTER — Other Ambulatory Visit: Payer: Self-pay

## 2016-12-24 DIAGNOSIS — Z1159 Encounter for screening for other viral diseases: Secondary | ICD-10-CM | POA: Diagnosis not present

## 2016-12-24 DIAGNOSIS — Z114 Encounter for screening for human immunodeficiency virus [HIV]: Secondary | ICD-10-CM | POA: Diagnosis not present

## 2016-12-24 DIAGNOSIS — E119 Type 2 diabetes mellitus without complications: Secondary | ICD-10-CM

## 2016-12-24 DIAGNOSIS — R001 Bradycardia, unspecified: Secondary | ICD-10-CM | POA: Diagnosis not present

## 2016-12-25 LAB — HEMOGLOBIN A1C
HEMOGLOBIN A1C: 5.7 %{Hb} — AB (ref <5.7)
MEAN PLASMA GLUCOSE: 117 (calc)
eAG (mmol/L): 6.5 (calc)

## 2016-12-25 LAB — COMPLETE METABOLIC PANEL WITH GFR
AG Ratio: 1.4 (calc) (ref 1.0–2.5)
ALKALINE PHOSPHATASE (APISO): 45 U/L (ref 33–130)
ALT: 10 U/L (ref 6–29)
AST: 15 U/L (ref 10–35)
Albumin: 4.2 g/dL (ref 3.6–5.1)
BUN: 15 mg/dL (ref 7–25)
CO2: 28 mmol/L (ref 20–32)
CREATININE: 0.86 mg/dL (ref 0.50–0.99)
Calcium: 9.6 mg/dL (ref 8.6–10.4)
Chloride: 104 mmol/L (ref 98–110)
GFR, Est African American: 82 mL/min/{1.73_m2} (ref >=60)
GFR, Est Non African American: 71 mL/min/{1.73_m2} (ref >=60)
GLUCOSE: 90 mg/dL (ref 65–99)
Globulin: 2.9 g/dL (calc) (ref 1.9–3.7)
Potassium: 4.7 mmol/L (ref 3.5–5.3)
Sodium: 141 mmol/L (ref 135–146)
Total Bilirubin: 0.4 mg/dL (ref 0.2–1.2)
Total Protein: 7.1 g/dL (ref 6.1–8.1)

## 2016-12-25 LAB — LIPID PANEL
CHOL/HDL RATIO: 2.1 (calc) (ref <5.0)
CHOLESTEROL: 159 mg/dL (ref <200)
HDL: 75 mg/dL (ref >50)
LDL CHOLESTEROL (CALC): 66 mg/dL
Non-HDL Cholesterol (Calc): 84 mg/dL (calc) (ref <130)
Triglycerides: 94 mg/dL (ref <150)

## 2016-12-25 LAB — MICROALBUMIN / CREATININE URINE RATIO
Creatinine, Urine: 50 mg/dL (ref 20–275)
MICROALB UR: 0.7 mg/dL
Microalb Creat Ratio: 14 mcg/mg creat (ref <30)

## 2016-12-25 LAB — HEPATITIS C ANTIBODY
Hepatitis C Ab: NONREACTIVE
SIGNAL TO CUT-OFF: 0.01 (ref <1.00)

## 2016-12-25 LAB — HIV ANTIBODY (ROUTINE TESTING W REFLEX): HIV: NONREACTIVE

## 2016-12-25 LAB — TSH: TSH: 1.04 mIU/L (ref 0.40–4.50)

## 2016-12-30 ENCOUNTER — Ambulatory Visit (INDEPENDENT_AMBULATORY_CARE_PROVIDER_SITE_OTHER): Payer: Medicare Other | Admitting: Family Medicine

## 2016-12-30 ENCOUNTER — Encounter: Payer: Self-pay | Admitting: Family Medicine

## 2016-12-30 VITALS — BP 118/78 | HR 67 | Temp 98.1°F | Ht 66.0 in | Wt 217.2 lb

## 2016-12-30 DIAGNOSIS — Z13820 Encounter for screening for osteoporosis: Secondary | ICD-10-CM | POA: Diagnosis not present

## 2016-12-30 DIAGNOSIS — I10 Essential (primary) hypertension: Secondary | ICD-10-CM | POA: Diagnosis not present

## 2016-12-30 DIAGNOSIS — M7632 Iliotibial band syndrome, left leg: Secondary | ICD-10-CM | POA: Diagnosis not present

## 2016-12-30 DIAGNOSIS — E119 Type 2 diabetes mellitus without complications: Secondary | ICD-10-CM | POA: Diagnosis not present

## 2016-12-30 MED ORDER — MELOXICAM 15 MG PO TABS: 15.0000 mg | ORAL_TABLET | Freq: Every day | ORAL | 1 refills | Status: DC | PRN

## 2016-12-30 NOTE — Patient Instructions (Addendum)
Consider compression stockings for the legs Consider horse chestnut for vein health Continue other therapies Let me know if left leg and hip pain continue, as we can get you in to the physical therapist Keep up the good work with weight loss

## 2016-12-30 NOTE — Progress Notes (Signed)
BP 118/78 (BP Location: Left Arm, Patient Position: Sitting, Cuff Size: Large)   Pulse 67   Temp 98.1 F (36.7 C) (Oral)   Ht 5\' 6"  (1.676 m)   Wt 217 lb 3.2 oz (98.5 kg)   LMP  (LMP Unknown)   SpO2 99%   BMI 35.06 kg/m    Subjective:    Patient ID: Allison Phillips, female    DOB: 1950-04-27, 66 y.o.   MRN: [id number]  HPI: Allison Phillips is a 66 y.o. female  Chief Complaint  Patient presents with  . Diabetes  . Follow-up    Pt states that she does now remember taking Fosamax   . Numbness    Bilateral legs, typically only on the front of the legs recently started to radiate around the leg     HPI Patient is here for regular f/u of her diabetes and other chronic health issues, but has left hip and leg pain Pain that originated in the LEFT hip over greater troch bursa, now down the LEFT lateral leg Also numb on the front of the thighs; pain all the way around the back of the leg Has not tried any OTC NSAIDs Also broken veins in the backs of the legs  Did used to take fosamax; mentioned that to the nurse  High cholesterol; we reviewed her last few lipid panels TG have really come down, 179 to 94 LDL only 66; she does not know the reason for the improvement  TSH; weight down one pound and that's after Thanksgiving  Type 2 diabetes No dry mouth or blurred vision  Reviewed all other labs   Depression screen [facility] 2/9 12/30/2016 12/06/2016 07/23/2016 03/23/2016 11/26/2015  Decreased Interest 0 0 0 0 1  Down, Depressed, Hopeless 0 0 0 0 1  PHQ - 2 Score 0 0 0 0 2  Altered sleeping - - - - 0  Tired, decreased energy - - - - 1  Change in appetite - - - - 0  Feeling bad or failure about yourself  - - - - 0  Trouble concentrating - - - - 0  Moving slowly or fidgety/restless - - - - 0  Suicidal thoughts - - - - 0  PHQ-9 Score - - - - 3    Relevant past medical, surgical, family and social history reviewed Past Medical History:  Diagnosis Date  . Bursitis of left hip    . Depression   . Diverticulitis   . Hypertension   . Joint pain   . Obesity   . Osteoporosis   . Prediabetes   . Reflux   . Tobacco use    Past Surgical History:  Procedure Laterality Date  . ANAL FISSURE REPAIR    . APPENDECTOMY    . [place]  . COLONOSCOPY WITH PROPOFOL N/A 12/18/2014   Procedure: COLONOSCOPY WITH PROPOFOL;  Surgeon: Christene Lye, MD;  Location: [facility] ENDOSCOPY;  Service: Endoscopy;  Laterality: N/A;  . LIPOMA EXCISION  07/2015   right shoulder  . LIPOMA EXCISION  11/19/2015  . OOPHORECTOMY     for "fibroids" not sure which ovary   Family History  Problem Relation Age of Onset  . Hypertension Mother   . Kidney disease Mother   . Alzheimer's disease Mother    Social History   Tobacco Use  . Smoking status: Former Smoker    Packs/day: 0.50    Years: 15.00    Pack years: 7.50  Types: Cigarettes    Last attempt to quit: 10/24/2016    Years since quitting: 0.1  . Smokeless tobacco: Never Used  Substance Use Topics  . Alcohol use: Yes    Alcohol/week: 2.4 oz    Types: 4 Standard drinks or equivalent per week    Comment: on occasion  . Drug use: No    Interim medical history since last visit reviewed. Allergies and medications reviewed  Review of Systems Per HPI unless specifically indicated above     Objective:    BP 118/78 (BP Location: Left Arm, Patient Position: Sitting, Cuff Size: Large)   Pulse 67   Temp 98.1 F (36.7 C) (Oral)   Ht 5\' 6"  (1.676 m)   Wt 217 lb 3.2 oz (98.5 kg)   LMP  (LMP Unknown)   SpO2 99%   BMI 35.06 kg/m   Wt Readings from Last 3 Encounters:  12/30/16 217 lb 3.2 oz (98.5 kg)  12/06/16 218 lb 3.2 oz (99 kg)  07/23/16 211 lb 9.6 oz (96 kg)    Physical Exam  Constitutional: She appears well-developed and well-nourished. No distress.  HENT:  Mouth/Throat: Mucous membranes are normal.  Eyes: EOM are normal. No scleral icterus.  Neck: No JVD present. No thyromegaly present.    Cardiovascular: Normal rate and regular rhythm.  Pulmonary/Chest: Effort normal and breath sounds normal.  Abdominal: Soft. Bowel sounds are normal. She exhibits no distension.  Musculoskeletal: She exhibits no edema.       Left upper leg: She exhibits tenderness.  Tender along the LEFT greater trochanter and LEFT  ITB; no calf swelling or erythema or tenderness; few varicose veins, spider veins posteriorly  Neurological: She is alert.  Skin: Skin is warm. No pallor.  Psychiatric: She has a normal mood and affect. Her behavior is normal. Her mood appears not anxious. She does not exhibit a depressed mood.   Diabetic Foot Form - Detailed   Diabetic Foot Exam - detailed Diabetic Foot exam was performed with the following findings:  Yes 12/30/2016  8:42 AM  Visual Foot Exam completed.:  Yes  Pulse Foot Exam completed.:  Yes  Right Dorsalis Pedis:  Present Left Dorsalis Pedis:  Present  Sensory Foot Exam Completed.:  Yes Semmes-Weinstein Monofilament Test R Site 1-Great Toe:  Pos L Site 1-Great Toe:  Pos       Results for orders placed or performed in visit on 12/24/16  TSH  Result Value Ref Range   TSH 1.04 0.40 - 4.50 mIU/L  COMPLETE METABOLIC PANEL WITH GFR  Result Value Ref Range   Glucose, Bld 90 65 - 99 mg/dL   BUN 15 7 - 25 mg/dL   Creat 0.86 0.50 - 0.99 mg/dL   GFR, Est Non African American 71 > OR = 60 mL/min/1.80m2   GFR, Est African American 82 > OR = 60 mL/min/1.80m2   BUN/Creatinine Ratio NOT APPLICABLE 6 - 22 (calc)   Sodium 141 135 - 146 mmol/L   Potassium 4.7 3.5 - 5.3 mmol/L   Chloride 104 98 - 110 mmol/L   CO2 28 20 - 32 mmol/L   Calcium 9.6 8.6 - 10.4 mg/dL   Total Protein 7.1 6.1 - 8.1 g/dL   Albumin 4.2 3.6 - 5.1 g/dL   Globulin 2.9 1.9 - 3.7 g/dL (calc)   AG Ratio 1.4 1.0 - 2.5 (calc)   Total Bilirubin 0.4 0.2 - 1.2 mg/dL   Alkaline phosphatase (APISO) 45 33 - 130 U/L   AST 15  10 - 35 U/L   ALT 10 6 - 29 U/L  Hemoglobin A1c  Result Value Ref Range    Hgb A1c MFr Bld 5.7 (H) <5.7 % of total Hgb   Mean Plasma Glucose 117 (calc)   eAG (mmol/L) 6.5 (calc)  Lipid panel  Result Value Ref Range   Cholesterol 159 <200 mg/dL   HDL 75 >50 mg/dL   Triglycerides 94 <150 mg/dL   LDL Cholesterol (Calc) 66 mg/dL (calc)   Total CHOL/HDL Ratio 2.1 <5.0 (calc)   Non-HDL Cholesterol (Calc) 84 <130 mg/dL (calc)  Microalbumin / creatinine urine ratio  Result Value Ref Range   Creatinine, Urine 50 20 - 275 mg/dL   Microalb, Ur 0.7 mg/dL   Microalb Creat Ratio 14 <30 mcg/mg creat  Hepatitis C Antibody  Result Value Ref Range   Hepatitis C Ab NON-REACTIVE NON-REACTI   SIGNAL TO CUT-OFF 0.01 <1.00  HIV antibody  Result Value Ref Range   HIV 1&2 Ab, 4th Generation NON-REACTIVE NON-REACTI      Assessment & Plan:   Problem List Items Addressed This Visit      Cardiovascular and Mediastinum   Essential hypertension, benign    controlled        Endocrine   Controlled type 2 diabetes mellitus without complication, without long-term current use of insulin ([place])    Foot exam by MD today; A1c reviewed; urine microalb:Cr reviewed; work on weight loss        Other   Screening for osteoporosis    Patient has DEXA orders on chart and staff gave her number      Morbid obesity ([place])    Patient is working on weight loss       Other Visit Diagnoses    Iliotibial band syndrome of left side    -  Primary       Follow up plan: Return in about 6 months (around 06/30/2017) for twenty minute follow-up, with labs a few days prior.  An after-visit summary was printed and given to the patient at [place].  Please see the patient instructions which may contain other information and recommendations beyond what is mentioned above in the assessment and plan.  Meds ordered this encounter  Medications  . meloxicam (MOBIC) 15 MG tablet    Sig: Take 1 tablet (15 mg total) by mouth daily as needed for pain. Take at least one hour AFTER aspirin     Dispense:  30 tablet    Refill:  1    No orders of the defined types were placed in this encounter.

## 2016-12-30 NOTE — Assessment & Plan Note (Signed)
Patient has DEXA orders on chart and staff gave her number

## 2016-12-30 NOTE — Assessment & Plan Note (Signed)
Foot exam by MD today; A1c reviewed; urine microalb:Cr reviewed; work on weight loss

## 2016-12-30 NOTE — Assessment & Plan Note (Signed)
controlled 

## 2016-12-30 NOTE — Assessment & Plan Note (Signed)
Patient is working on weight loss 

## 2017-01-04 ENCOUNTER — Ambulatory Visit
Admission: RE | Admit: 2017-01-04 | Discharge: 2017-01-04 | Disposition: A | Discharge status: Home / Self Care | Payer: Medicare Other | Source: Ambulatory Visit | Attending: Family Medicine | Admitting: Family Medicine

## 2017-01-04 DIAGNOSIS — Z1231 Encounter for screening mammogram for malignant neoplasm of breast: Secondary | ICD-10-CM

## 2017-03-05 NOTE — Progress Notes (Signed)
Closing old order note

## 2017-03-05 NOTE — Progress Notes (Signed)
Closing out scanned document note open since  Nov 2018

## 2017-03-05 NOTE — Progress Notes (Signed)
Closing out old orders

## 2017-03-18 ENCOUNTER — Ambulatory Visit
Admission: RE | Admit: 2017-03-18 | Discharge: 2017-03-18 | Disposition: A | Discharge status: Home / Self Care | Payer: Medicare Other | Source: Ambulatory Visit | Attending: Family Medicine | Admitting: Family Medicine

## 2017-03-18 ENCOUNTER — Encounter: Payer: Self-pay | Admitting: Family Medicine

## 2017-03-18 ENCOUNTER — Ambulatory Visit (INDEPENDENT_AMBULATORY_CARE_PROVIDER_SITE_OTHER): Payer: Medicare Other | Admitting: Family Medicine

## 2017-03-18 DIAGNOSIS — M533 Sacrococcygeal disorders, not elsewhere classified: Secondary | ICD-10-CM

## 2017-03-18 DIAGNOSIS — G8929 Other chronic pain: Secondary | ICD-10-CM

## 2017-03-18 DIAGNOSIS — M25552 Pain in left hip: Secondary | ICD-10-CM

## 2017-03-18 DIAGNOSIS — M5416 Radiculopathy, lumbar region: Secondary | ICD-10-CM | POA: Diagnosis not present

## 2017-03-18 DIAGNOSIS — M5442 Lumbago with sciatica, left side: Secondary | ICD-10-CM

## 2017-03-18 DIAGNOSIS — M545 Low back pain, unspecified: Secondary | ICD-10-CM

## 2017-03-18 DIAGNOSIS — M47817 Spondylosis without myelopathy or radiculopathy, lumbosacral region: Secondary | ICD-10-CM | POA: Diagnosis not present

## 2017-03-18 HISTORY — DX: Other chronic pain: G89.29

## 2017-03-18 HISTORY — DX: Sacrococcygeal disorders, not elsewhere classified: M53.3

## 2017-03-18 HISTORY — DX: Low back pain, unspecified: M54.50

## 2017-03-18 MED ORDER — HYDROCODONE-ACETAMINOPHEN 5-325 MG PO TABS: 1.0000 | ORAL_TABLET | Freq: Four times a day (QID) | ORAL | 0 refills | Status: DC | PRN

## 2017-03-18 NOTE — Progress Notes (Signed)
BP (!) 142/78   Pulse 77   Temp 98.8 F (37.1 C) (Oral)   Resp 16   Wt 218 lb 14.4 oz (99.3 kg)   LMP  (LMP Unknown)   SpO2 96%   BMI 35.33 kg/m    Subjective:    Patient ID: Allison Phillips, female    DOB: 1950/12/21, 67 y.o.   MRN: [id number]  HPI: Allison Phillips is a 67 y.o. female  Chief Complaint  Patient presents with  . Hip Pain    left several months, has spoken to you about this before.  It is worsening    HPI Here for left hip and lateral leg knee; sore all the way down to the distal ITB; very sore even above the top the left pelvic bone Having pain that is sharp and stabbing Was feeling good yesterday until she sat down on a chair and then could barely get up Never had a rash It did start in the joint initially and was thought to be bursitis; wonders if there is a muscle issue Just has an occasional pain on the left side; not all the time No circulation issues; was having numbness and that is better No old injury Taking aspirin and meloxicam No exercise No loss of control of B/B Pain with walking in the left side hip girdle Had bone density test and it was osteopenia CT abd/pelvis July 2018 showed no worrisome MS issues No heaviness in the left leg Tried ice and heat and ice helps more, just temporary No weight loss, no nights sweats Having right shoulder issues, saw orthopaedist for that; degenerative issue; [place] Pain level will be a 5 out of 10 and that's with medicine; in the morning upon first getting up, 7 or 8 out of 10 The soreness is steady but sharp pains can be completely gone for a day at a time Left hip pain just in January or perhaps December; gone up higher in the last few weeks Mother has arthritis, lots of joint issues, bone on bone  Depression screen [facility] 2/9 03/18/2017 12/30/2016 12/06/2016 07/23/2016 03/23/2016  Decreased Interest 0 0 0 0 0  Down, Depressed, Hopeless 0 0 0 0 0  PHQ - 2 Score 0 0 0 0 0  Altered sleeping - - - - -    Tired, decreased energy - - - - -  Change in appetite - - - - -  Feeling bad or failure about yourself  - - - - -  Trouble concentrating - - - - -  Moving slowly or fidgety/restless - - - - -  Suicidal thoughts - - - - -  PHQ-9 Score - - - - -    Relevant past medical, surgical, family and social history reviewed Past Medical History:  Diagnosis Date  . Bursitis of left hip   . Depression   . Diverticulitis   . Hypertension   . Joint pain   . Obesity   . Osteoporosis   . Prediabetes   . Reflux   . Tobacco use    Past Surgical History:  Procedure Laterality Date  . ANAL FISSURE REPAIR    . APPENDECTOMY    . [place]  . COLONOSCOPY WITH PROPOFOL N/A 12/18/2014   Procedure: COLONOSCOPY WITH PROPOFOL;  Surgeon: Christene Lye, MD;  Location: [facility] ENDOSCOPY;  Service: Endoscopy;  Laterality: N/A;  . LIPOMA EXCISION  07/2015   right shoulder  . LIPOMA EXCISION  11/19/2015  .  OOPHORECTOMY     for "fibroids" not sure which ovary   Family History  Problem Relation Age of Onset  . Hypertension Mother   . Kidney disease Mother   . Alzheimer's disease Mother   . Breast cancer Neg Hx    Social History   Tobacco Use  . Smoking status: Former Smoker    Packs/day: 0.50    Years: 15.00    Pack years: 7.50    Types: Cigarettes    Last attempt to quit: 10/24/2016    Years since quitting: 0.3  . Smokeless tobacco: Never Used  Substance Use Topics  . Alcohol use: Yes    Alcohol/week: 2.4 oz    Types: 4 Standard drinks or equivalent per week    Comment: on occasion  . Drug use: No    Interim medical history since last visit reviewed. Allergies and medications reviewed  Review of Systems Per HPI unless specifically indicated above     Objective:    BP (!) 142/78   Pulse 77   Temp 98.8 F (37.1 C) (Oral)   Resp 16   Wt 218 lb 14.4 oz (99.3 kg)   LMP  (LMP Unknown)   SpO2 96%   BMI 35.33 kg/m   Wt Readings from Last 3 Encounters:   03/18/17 218 lb 14.4 oz (99.3 kg)  12/30/16 217 lb 3.2 oz (98.5 kg)  12/06/16 218 lb 3.2 oz (99 kg)    Physical Exam  Constitutional: She appears well-developed and well-nourished.  HENT:  Mouth/Throat: Mucous membranes are normal.  Eyes: EOM are normal. No scleral icterus.  Cardiovascular: Normal rate and regular rhythm.  Pulmonary/Chest: Effort normal and breath sounds normal.  Musculoskeletal:       Left hip: She exhibits normal range of motion and no tenderness.       Lumbar back: She exhibits decreased range of motion and tenderness. She exhibits no swelling, no deformity and no spasm.  Only able to flex at the waist to about 60 degrees; rotation of trunk and extension at the waist caused pain LEFT more than RIGHT; discomfort with external rotation of LEFT hip; tender over the greater trochanter  Psychiatric: She has a normal mood and affect. Her behavior is normal.      Assessment & Plan:   Problem List Items Addressed This Visit      Other   Sacroiliac joint pain    Imaging and referral to ortho      Relevant Medications   HYDROcodone-acetaminophen (NORCO/VICODIN) 5-325 MG tablet   Other Relevant Orders   MR HIP LEFT W WO CONTRAST   DG HIP UNILAT WITH PELVIS 2-3 VIEWS LEFT   DG Lumbar Spine Complete   Ambulatory referral to Orthopedic Surgery   Lower back pain    Imaging and refer      Relevant Medications   HYDROcodone-acetaminophen (NORCO/VICODIN) 5-325 MG tablet   Other Relevant Orders   MR HIP LEFT W WO CONTRAST   DG HIP UNILAT WITH PELVIS 2-3 VIEWS LEFT   DG Lumbar Spine Complete   Ambulatory referral to Orthopedic Surgery   Hip pain, chronic, left    Will get imaging; will refer back to orthopaedist and then I will try to order MRI of the hip region; for pain, will hydrocodone to use if needed; still okay to use meloxicam, work differently      Relevant Medications   HYDROcodone-acetaminophen (NORCO/VICODIN) 5-325 MG tablet   Other Relevant Orders    MR HIP LEFT W  WO CONTRAST   DG HIP UNILAT WITH PELVIS 2-3 VIEWS LEFT   DG Lumbar Spine Complete   Ambulatory referral to Orthopedic Surgery       Follow up plan: No Follow-up on file.  An after-visit summary was printed and given to the patient at [place].  Please see the patient instructions which may contain other information and recommendations beyond what is mentioned above in the assessment and plan.  Meds ordered this encounter  Medications  . HYDROcodone-acetaminophen (NORCO/VICODIN) 5-325 MG tablet    Sig: Take 1 tablet by mouth every 6 (six) hours as needed for moderate pain.    Dispense:  20 tablet    Refill:  0    Orders Placed This Encounter  Procedures  . MR HIP LEFT W WO CONTRAST  . DG HIP UNILAT WITH PELVIS 2-3 VIEWS LEFT  . DG Lumbar Spine Complete  . Ambulatory referral to Orthopedic Surgery

## 2017-03-18 NOTE — Assessment & Plan Note (Signed)
Imaging and refer

## 2017-03-18 NOTE — Assessment & Plan Note (Signed)
Will get imaging; will refer back to orthopaedist and then I will try to order MRI of the hip region; for pain, will hydrocodone to use if needed; still okay to use meloxicam, work differently

## 2017-03-18 NOTE — Assessment & Plan Note (Signed)
Imaging and referral to ortho

## 2017-03-18 NOTE — Patient Instructions (Signed)
Please do call to schedule your bone density study; the number to schedule one at either [facility] or [facility] is [phone] or [phone]  Please have the imaging studies done across the street today We'll try to get an MRI approved, we'll try to get that covered or do a bone scan We'll refer you to the orthopaedist who may need to do the imaging

## 2017-03-28 ENCOUNTER — Telehealth: Payer: Self-pay | Admitting: Family Medicine

## 2017-03-28 ENCOUNTER — Ambulatory Visit: Payer: Medicare Other | Admitting: Family Medicine

## 2017-03-28 NOTE — Telephone Encounter (Signed)
We are looking for mass/cancer

## 2017-03-28 NOTE — Telephone Encounter (Signed)
Please advise 

## 2017-03-28 NOTE — Telephone Encounter (Signed)
Allison Phillips was informed that the imaging is to r/o mass/ cancer and stated she would take care of it.

## 2017-03-28 NOTE — Telephone Encounter (Signed)
Copied from [place] [phone]. Topic: General - Other >> Mar 28, 2017  9:56 AM Cecelia Byars, NT wrote: Reason for CRM:  [facility] scheduling called and said that she was scheduled for left with with and without contrast unless you are looking for infection oa mass this needs to be changed to a mri without left contrast  please call [facility]  [phone] fax

## 2017-04-01 DIAGNOSIS — M7062 Trochanteric bursitis, left hip: Secondary | ICD-10-CM | POA: Diagnosis not present

## 2017-04-05 ENCOUNTER — Ambulatory Visit: Payer: Medicare Other

## 2017-04-05 ENCOUNTER — Telehealth: Payer: Self-pay | Admitting: Family Medicine

## 2017-04-05 NOTE — Telephone Encounter (Signed)
-----   Message from Dennard Schaumann, [place] sent at 04/04/2017 12:37 PM EDT ----- Patient is scheduled for her MRI on tomorrow but her imaging requires peer to peer since it was mentioned that you are trying to r/o mass or cancer.  please call the physician review line [phone] and use the reference # [id number].  Thanks

## 2017-04-05 NOTE — Telephone Encounter (Signed)
I returned the call Left message asking for return call

## 2017-04-06 ENCOUNTER — Telehealth: Payer: Self-pay

## 2017-04-06 NOTE — Telephone Encounter (Signed)
See other phone note; MRI denied; will have to see if ortho can see her and get it approved

## 2017-04-06 NOTE — Telephone Encounter (Signed)
I got a call from [facility] stating that this imaging has been denied due to not enough information regarding why you would think there is a mass or cancer since there was no palpable mass or anything seen on imaging.  They have suggested that you re-consider and maybe re-order at a later time.

## 2017-04-06 NOTE — Telephone Encounter (Signed)
We'll see if the orthopaedist can get it approved Check on the referral put in for ortho on 03/18/17 please Let her know that we'll have to see if ortho can get this approved and paid for Thank you for your help

## 2017-04-07 NOTE — Telephone Encounter (Signed)
She had an appointment with Dr. Hessie Knows on 04/01/2017.  Please review the records in Care Everywhere

## 2017-04-07 NOTE — Telephone Encounter (Signed)
Dx greater trochanteric bursitis

## 2017-04-20 ENCOUNTER — Encounter: Payer: Self-pay | Admitting: Family Medicine

## 2017-04-20 ENCOUNTER — Ambulatory Visit
Admission: RE | Admit: 2017-04-20 | Discharge: 2017-04-20 | Disposition: A | Discharge status: Home / Self Care | Payer: Medicare Other | Source: Ambulatory Visit | Attending: Family Medicine | Admitting: Family Medicine

## 2017-04-20 DIAGNOSIS — Z780 Asymptomatic menopausal state: Secondary | ICD-10-CM | POA: Diagnosis not present

## 2017-04-20 DIAGNOSIS — M85851 Other specified disorders of bone density and structure, right thigh: Secondary | ICD-10-CM | POA: Insufficient documentation

## 2017-04-20 DIAGNOSIS — Z13820 Encounter for screening for osteoporosis: Secondary | ICD-10-CM | POA: Insufficient documentation

## 2017-04-20 DIAGNOSIS — M8580 Other specified disorders of bone density and structure, unspecified site: Secondary | ICD-10-CM

## 2017-04-20 DIAGNOSIS — M8588 Other specified disorders of bone density and structure, other site: Secondary | ICD-10-CM | POA: Insufficient documentation

## 2017-04-20 DIAGNOSIS — M8589 Other specified disorders of bone density and structure, multiple sites: Secondary | ICD-10-CM | POA: Diagnosis not present

## 2017-04-20 HISTORY — DX: Other specified disorders of bone density and structure, unspecified site: M85.80

## 2017-05-31 LAB — HM DIABETES EYE EXAM

## 2017-06-30 ENCOUNTER — Ambulatory Visit: Payer: Medicare Other | Admitting: Family Medicine

## 2017-07-07 ENCOUNTER — Encounter: Payer: Self-pay | Admitting: Family Medicine

## 2017-07-07 ENCOUNTER — Ambulatory Visit (INDEPENDENT_AMBULATORY_CARE_PROVIDER_SITE_OTHER): Payer: Medicare Other | Admitting: Family Medicine

## 2017-07-07 VITALS — BP 124/72 | HR 63 | Temp 98.3°F | Resp 14 | Ht 66.0 in | Wt 223.2 lb

## 2017-07-07 DIAGNOSIS — E669 Obesity, unspecified: Secondary | ICD-10-CM

## 2017-07-07 DIAGNOSIS — N182 Chronic kidney disease, stage 2 (mild): Secondary | ICD-10-CM

## 2017-07-07 DIAGNOSIS — M7072 Other bursitis of hip, left hip: Secondary | ICD-10-CM | POA: Diagnosis not present

## 2017-07-07 DIAGNOSIS — E119 Type 2 diabetes mellitus without complications: Secondary | ICD-10-CM | POA: Diagnosis not present

## 2017-07-07 DIAGNOSIS — L700 Acne vulgaris: Secondary | ICD-10-CM | POA: Diagnosis not present

## 2017-07-07 DIAGNOSIS — D485 Neoplasm of uncertain behavior of skin: Secondary | ICD-10-CM

## 2017-07-07 NOTE — Assessment & Plan Note (Signed)
Recent steroid injection which may raise glucose

## 2017-07-07 NOTE — Assessment & Plan Note (Signed)
Previous diagnosis, however, exellent control on no medicines; just watching closely

## 2017-07-07 NOTE — Assessment & Plan Note (Signed)
Encouraged weight loss 

## 2017-07-07 NOTE — Assessment & Plan Note (Signed)
Limit NSAIDs; hydrated

## 2017-07-07 NOTE — Patient Instructions (Addendum)
Try to use PLAIN allergy medicine without the decongestant Avoid: phenylephrine, phenylpropanolamine, and pseudoephredine  To help preserve your kidney function, I'll suggest that you limit or avoid non-steroidal anti-inflammatory medicines like Advil, Motrin, Aleve, ibuprofen, naproxen, and Goody's powders. If you need something for aches/pains, try plain Tyelnol (acetaminophen). Also, try to drink enough water and other caffeine-free beverages during the day to keep your urine a very pale yellow to clear color.  Check your vitamin D at home; we recommend 1,000 iu of vitamin D3 daily  We'll contact you about the A1c  Check out the information at familydoctor.org entitled "Nutrition for Weight Loss: What You Need to Know about Fad Diets" Try to lose between 1-2 pounds per week by taking in fewer calories and burning off more calories You can succeed by limiting portions, limiting foods dense in calories and fat, becoming more active, and drinking 8 glasses of water a day (64 ounces) Don't skip meals, especially breakfast, as skipping meals may alter your metabolism Do not use over-the-counter weight loss pills or gimmicks that claim rapid weight loss A healthy BMI (or body mass index) is between 18.5 and 24.9 You can calculate your ideal BMI at the [place] website [employer]Mon[employer]fr

## 2017-07-07 NOTE — Progress Notes (Signed)
BP 124/72   Pulse 63   Temp 98.3 F (36.8 C) (Oral)   Resp 14   Ht 5\' 6"  (1.676 m)   Wt 223 lb 3.2 oz (101.2 kg)   LMP  (LMP Unknown)   SpO2 95%   BMI 36.03 kg/m    Subjective:    Patient ID: Allison Phillips, female    DOB: [birth year], 67 y.o.   MRN: [id number]  HPI: Allison Phillips is a 67 y.o. female  Chief Complaint  Patient presents with  . Follow-up    HPI Here for 6 month f/u Injection in the left hip; Dr. Arvella Nigh; can do twice a year; some initial improvement, starting to come back; injection in March; able to be active  Blood presure is excellent without the lisinopril; just never started it; limits salt somewhat in her diet; rarely uses decongestants; little bit of runny nose, not to the point of needing meds  Type 2 diabetes; last A1c 5.7; sister is prediabetic; only fam member; parents never had diabetes; mother is almost 21 years  Mother only has just one kidney; just discovered, may have been born that way or just atrophied; no problems with urine  Barely in stage 2 CKD; making herself drink more water; not taking meloxicam; not taking NSAIDs  Osteopenia; taking calcium and vit D; no risky habits; fall precautions  Mole under the RIGHT upper thigh, burns and stings; not an old mole; like a tag; also has blackheads in her ears   Depression screen [facility] 2/9 07/07/2017 03/18/2017 12/30/2016 12/06/2016 07/23/2016  Decreased Interest 0 0 0 0 0  Down, Depressed, Hopeless 0 0 0 0 0  PHQ - 2 Score 0 0 0 0 0  Altered sleeping - - - - -  Tired, decreased energy - - - - -  Change in appetite - - - - -  Feeling bad or failure about yourself  - - - - -  Trouble concentrating - - - - -  Moving slowly or fidgety/restless - - - - -  Suicidal thoughts - - - - -  PHQ-9 Score - - - - -   Relevant past medical, surgical, family and social history reviewed Past Medical History:  Diagnosis Date  . Bursitis of left hip   . Depression   . Diverticulitis   . Hypertension     . Joint pain   . Obesity   . Osteopenia 04/20/2017   April 2019; next DEXA April 2021  . Osteoporosis   . Prediabetes   . Reflux   . Tobacco use    Past Surgical History:  Procedure Laterality Date  . ANAL FISSURE REPAIR    . APPENDECTOMY    . [place]  . COLONOSCOPY WITH PROPOFOL N/A 12/18/2014   Procedure: COLONOSCOPY WITH PROPOFOL;  Surgeon: Christene Lye, MD;  Location: [facility] ENDOSCOPY;  Service: Endoscopy;  Laterality: N/A;  . LIPOMA EXCISION  07/2015   right shoulder  . LIPOMA EXCISION  11/19/2015  . OOPHORECTOMY     for "fibroids" not sure which ovary   Family History  Problem Relation Age of Onset  . Hypertension Mother   . Kidney disease Mother   . Alzheimer's disease Mother   . Breast cancer Neg Hx    Social History   Tobacco Use  . Smoking status: Former Smoker    Packs/day: 0.50    Years: 15.00    Pack years: 7.50    Types: Cigarettes  Last attempt to quit: 10/24/2016    Years since quitting: 0.7  . Smokeless tobacco: Never Used  Substance Use Topics  . Alcohol use: Yes    Alcohol/week: 2.4 oz    Types: 4 Standard drinks or equivalent per week    Comment: on occasion  . Drug use: No    Interim medical history since last visit reviewed. Allergies and medications reviewed  Review of Systems Per HPI unless specifically indicated above     Objective:    BP 124/72   Pulse 63   Temp 98.3 F (36.8 C) (Oral)   Resp 14   Ht 5\' 6"  (1.676 m)   Wt 223 lb 3.2 oz (101.2 kg)   LMP  (LMP Unknown)   SpO2 95%   BMI 36.03 kg/m   Wt Readings from Last 3 Encounters:  07/07/17 223 lb 3.2 oz (101.2 kg)  03/18/17 218 lb 14.4 oz (99.3 kg)  12/30/16 217 lb 3.2 oz (98.5 kg)    Physical Exam  Constitutional: She appears well-developed and well-nourished. No distress.  HENT:  Head: Normocephalic and atraumatic.  Eyes: EOM are normal. No scleral icterus.  Neck: No thyromegaly present.  Cardiovascular: Normal rate, regular rhythm and  normal heart sounds.  No murmur heard. Pulmonary/Chest: Effort normal and breath sounds normal. No respiratory distress. She has no wheezes.  Abdominal: Soft. Bowel sounds are normal. She exhibits no distension.  Musculoskeletal: Normal range of motion. She exhibits no edema.  Neurological: She is alert. She exhibits normal muscle tone.  Skin: Skin is warm and dry. She is not diaphoretic. No pallor.  Open comedone in each external ear  Psychiatric: She has a normal mood and affect. Her behavior is normal. Judgment and thought content normal.   Diabetic Foot Form - Detailed   Diabetic Foot Exam - detailed Diabetic Foot exam was performed with the following findings:  Yes 07/07/2017  9:37 AM  Visual Foot Exam completed.:  Yes  Pulse Foot Exam completed.:  Yes  Right Dorsalis Pedis:  Present Left Dorsalis Pedis:  Present  Sensory Foot Exam Completed.:  Yes Semmes-Weinstein Monofilament Test R Site 1-Great Toe:  Pos L Site 1-Great Toe:  Pos         Results for orders placed or performed in visit on 12/24/16  TSH  Result Value Ref Range   TSH 1.04 0.40 - 4.50 mIU/L  COMPLETE METABOLIC PANEL WITH GFR  Result Value Ref Range   Glucose, Bld 90 65 - 99 mg/dL   BUN 15 7 - 25 mg/dL   Creat 0.86 0.50 - 0.99 mg/dL   GFR, Est Non African American 71 > OR = 60 mL/min/1.94m2   GFR, Est African American 82 > OR = 60 mL/min/1.47m2   BUN/Creatinine Ratio NOT APPLICABLE 6 - 22 (calc)   Sodium 141 135 - 146 mmol/L   Potassium 4.7 3.5 - 5.3 mmol/L   Chloride 104 98 - 110 mmol/L   CO2 28 20 - 32 mmol/L   Calcium 9.6 8.6 - 10.4 mg/dL   Total Protein 7.1 6.1 - 8.1 g/dL   Albumin 4.2 3.6 - 5.1 g/dL   Globulin 2.9 1.9 - 3.7 g/dL (calc)   AG Ratio 1.4 1.0 - 2.5 (calc)   Total Bilirubin 0.4 0.2 - 1.2 mg/dL   Alkaline phosphatase (APISO) 45 33 - 130 U/L   AST 15 10 - 35 U/L   ALT 10 6 - 29 U/L  Hemoglobin A1c  Result Value Ref Range  Hgb A1c MFr Bld 5.7 (H) <5.7 % of total Hgb   Mean Plasma  Glucose 117 (calc)   eAG (mmol/L) 6.5 (calc)  Lipid panel  Result Value Ref Range   Cholesterol 159 <200 mg/dL   HDL 75 >50 mg/dL   Triglycerides 94 <150 mg/dL   LDL Cholesterol (Calc) 66 mg/dL (calc)   Total CHOL/HDL Ratio 2.1 <5.0 (calc)   Non-HDL Cholesterol (Calc) 84 <130 mg/dL (calc)  Microalbumin / creatinine urine ratio  Result Value Ref Range   Creatinine, Urine 50 20 - 275 mg/dL   Microalb, Ur 0.7 mg/dL   Microalb Creat Ratio 14 <30 mcg/mg creat  Hepatitis C Antibody  Result Value Ref Range   Hepatitis C Ab NON-REACTIVE NON-REACTI   SIGNAL TO CUT-OFF 0.01 <1.00  HIV antibody  Result Value Ref Range   HIV 1&2 Ab, 4th Generation NON-REACTIVE NON-REACTI      Assessment & Plan:   Problem List Items Addressed This Visit      Endocrine   Controlled type 2 diabetes mellitus without complication, without long-term current use of insulin (HCC) - Primary    Previous diagnosis, however, exellent control on no medicines; just watching closely      Relevant Orders   Hemoglobin A1C     Musculoskeletal and Integument   Bursitis of left hip    Recent steroid injection which may raise glucose        Genitourinary   CKD (chronic kidney disease) stage 2, GFR 60-89 ml/min    Limit NSAIDs; hydrated        Other   Obesity (BMI 35.0-39.9 without comorbidity)    Encouraged weight loss       Other Visit Diagnoses    Open comedone       Relevant Orders   Ambulatory referral to Dermatology   Neoplasm of uncertain behavior of skin of lower leg       Relevant Orders   Ambulatory referral to Dermatology       Follow up plan: Return in about 6 months (around 01/06/2018).  An after-visit summary was printed and given to the patient at [facility].  Please see the patient instructions which may contain other information and recommendations beyond what is mentioned above in the assessment and plan.  No orders of the defined types were placed in this encounter.   Orders  Placed This Encounter  Procedures  . Hemoglobin A1C  . Ambulatory referral to Dermatology

## 2017-07-08 LAB — HEMOGLOBIN A1C
EAG (MMOL/L): 6.2 (calc)
Hgb A1c MFr Bld: 5.5 % of total Hgb (ref <5.7)
Mean Plasma Glucose: 111 (calc)

## 2017-07-14 ENCOUNTER — Telehealth: Payer: Self-pay

## 2017-07-14 NOTE — Telephone Encounter (Signed)
Copied from [place] [id number]. Topic: General - Other >> Jul 14, 2017 11:00 AM Carolyn Stare wrote:   Pt calling to follow up about her referral to see a dermatologist about a mole that is being irritated   [place] called [facility] and was informed that they tried to reached this patient but was unsuccessful. I then called this patient back and gave her their number [phone]) so that she could get scheduled.  She said ok and thanks.

## 2017-07-20 DIAGNOSIS — L918 Other hypertrophic disorders of the skin: Secondary | ICD-10-CM | POA: Diagnosis not present

## 2017-07-20 DIAGNOSIS — H6001 Abscess of right external ear: Secondary | ICD-10-CM | POA: Diagnosis not present

## 2017-08-15 ENCOUNTER — Encounter: Payer: Self-pay | Admitting: Family Medicine

## 2017-08-15 ENCOUNTER — Ambulatory Visit (INDEPENDENT_AMBULATORY_CARE_PROVIDER_SITE_OTHER): Payer: Medicare Other | Admitting: Family Medicine

## 2017-08-15 VITALS — BP 136/70 | HR 85 | Temp 98.7°F | Resp 16 | Ht 66.0 in | Wt 225.0 lb

## 2017-08-15 DIAGNOSIS — H9201 Otalgia, right ear: Secondary | ICD-10-CM

## 2017-08-15 NOTE — Patient Instructions (Addendum)
Start back on the meloxicam If not getting better, then let me know and we can talk about testing your hearing and/or having you see an Ear Nose Throat specialist Try taking 500 or 1000 mcg of sublingual vitamin B12 every afternoon  Check out the information at familydoctor.org entitled "Nutrition for Weight Loss: What You Need to Know about Fad Diets" Try to lose between 1-2 pounds per week by taking in fewer calories and burning off more calories You can succeed by limiting portions, limiting foods dense in calories and fat, becoming more active, and drinking 8 glasses of water a day (64 ounces) Don't skip meals, especially breakfast, as skipping meals may alter your metabolism Do not use over-the-counter weight loss pills or gimmicks that claim rapid weight loss A healthy BMI (or body mass index) is between 18.5 and 24.9 You can calculate your ideal BMI at the [place] website ClubMonetize.fr   Obesity, Adult Obesity is the condition of having too much total body fat. Being overweight or obese means that your weight is greater than what is considered healthy for your body size. Obesity is determined by a measurement called BMI. BMI is an estimate of body fat and is calculated from height and weight. For adults, a BMI of 30 or higher is considered obese. Obesity can eventually lead to other health concerns and major illnesses, including:  Stroke.  Coronary artery disease (CAD).  Type 2 diabetes.  Some types of cancer, including cancers of the colon, breast, uterus, and gallbladder.  Osteoarthritis.  High blood pressure (hypertension).  High cholesterol.  Sleep apnea.  Gallbladder stones.  Infertility problems.  What are the causes? The main cause of obesity is taking in (consuming) more calories than your body uses for energy. Other factors that contribute to this condition may include:  Being born with genes that make you more  likely to become obese.  Having a medical condition that causes obesity. These conditions include: ? Hypothyroidism. ? Polycystic ovarian syndrome (PCOS). ? Binge-eating disorder. ? Cushing syndrome.  Taking certain medicines, such as steroids, antidepressants, and seizure medicines.  Not being physically active (sedentary lifestyle).  Living where there are limited places to exercise safely or buy healthy foods.  Not getting enough sleep.  What increases the risk? The following factors may increase your risk of this condition:  Having a family history of obesity.  Being a woman of African-American descent.  Being a man of Hispanic descent.  What are the signs or symptoms? Having excessive body fat is the main symptom of this condition. How is this diagnosed? This condition may be diagnosed based on:  Your symptoms.  Your medical history.  A physical exam. Your health care provider may measure: ? Your BMI. If you are an adult with a BMI between 25 and less than 30, you are considered overweight. If you are an adult with a BMI of 30 or higher, you are considered obese. ? The distances around your hips and your waist (circumferences). These may be compared to each other to help diagnose your condition. ? Your skinfold thickness. Your health care provider may gently pinch a fold of your skin and measure it.  How is this treated? Treatment for this condition often includes changing your lifestyle. Treatment may include some or all of the following:  Dietary changes. Work with your health care provider and a dietitian to set a weight-loss goal that is healthy and reasonable for you. Dietary changes may include eating: ? Smaller portions. A  portion size is the amount of a particular food that is healthy for you to eat at one time. This varies from person to person. ? Low-calorie or low-fat options. ? More whole grains, fruits, and vegetables.  Regular physical activity. This  may include aerobic activity (cardio) and strength training.  Medicine to help you lose weight. Your health care provider may prescribe medicine if you are unable to lose 1 pound a week after 6 weeks of eating more healthily and doing more physical activity.  Surgery. Surgical options may include gastric banding and gastric bypass. Surgery may be done if: ? Other treatments have not helped to improve your condition. ? You have a BMI of 40 or higher. ? You have life-threatening health problems related to obesity.  Follow these instructions at home:  Eating and drinking   Follow recommendations from your health care provider about what you eat and drink. Your health care provider may advise you to: ? Limit fast foods, sweets, and processed snack foods. ? Choose low-fat options, such as low-fat milk instead of whole milk. ? Eat 5 or more servings of fruits or vegetables every day. ? Eat at home more often. This gives you more control over what you eat. ? Choose healthy foods when you eat out. ? Learn what a healthy portion size is. ? Keep low-fat snacks on hand. ? Avoid sugary drinks, such as soda, fruit juice, iced tea sweetened with sugar, and flavored milk. ? Eat a healthy breakfast.  Drink enough water to keep your urine clear or pale yellow.  Do not go without eating for long periods of time (do not fast) or follow a fad diet. Fasting and fad diets can be unhealthy and even dangerous. Physical Activity  Exercise regularly, as told by your health care provider. Ask your health care provider what types of exercise are safe for you and how often you should exercise.  Warm up and stretch before being active.  Cool down and stretch after being active.  Rest between periods of activity. Lifestyle  Limit the time that you spend in front of your TV, computer, or video game system.  Find ways to reward yourself that do not involve food.  Limit alcohol intake to no more than 1  drink a day for nonpregnant women and 2 drinks a day for men. One drink equals 12 oz of beer, 5 oz of wine, or 1 oz of hard liquor. General instructions  Keep a weight loss journal to keep track of the food you eat and how much you exercise you get.  Take over-the-counter and prescription medicines only as told by your health care provider.  Take vitamins and supplements only as told by your health care provider.  Consider joining a support group. Your health care provider may be able to recommend a support group.  Keep all follow-up visits as told by your health care provider. This is important. Contact a health care provider if:  You are unable to meet your weight loss goal after 6 weeks of dietary and lifestyle changes. This information is not intended to replace advice given to you by your health care provider. Make sure you discuss any questions you have with your health care provider. Document Released: 02/12/2004 Document Revised: 06/09/2015 Document Reviewed: 10/23/2014 Elsevier Interactive Patient Education  2018 [place].  Preventing Unhealthy [employer], Adult Staying at a healthy weight is important. When fat builds up in your body, you may become overweight or obese. These conditions  put you at greater risk for developing certain health problems, such as heart disease, diabetes, sleeping problems, joint problems, and some cancers. Unhealthy weight gain is often the result of making unhealthy choices in what you eat. It is also a result of not getting enough exercise. You can make changes to your lifestyle to prevent obesity and stay as healthy as possible. What nutrition changes can be made? To maintain a healthy weight and prevent obesity:  Eat only as much as your body needs. To do this: ? Pay attention to signs that you are hungry or full. Stop eating as soon as you feel full. ? If you feel hungry, try drinking water first. Drink enough water so your urine is clear  or pale yellow. ? Eat smaller portions. ? Look at serving sizes on food labels. Most foods contain more than one serving per container. ? Eat the recommended amount of calories for your gender and activity level. While most active people should eat around 2,000 calories per day, if you are trying to lose weight or are not very active, you main need to eat less calories. Talk to your health care provider or dietitian about how many calories you should eat each day.  Choose healthy foods, such as: ? Fruits and vegetables. Try to fill at least half of your plate at each meal with fruits and vegetables. ? Whole grains, such as whole wheat bread, brown rice, and quinoa. ? Lean meats, such as chicken or fish. ? Other healthy proteins, such as beans, eggs, or tofu. ? Healthy fats, such as nuts, seeds, fatty fish, and olive oil. ? Low-fat or fat-free dairy.  Check food labels and avoid food and drinks that: ? Are high in calories. ? Have added sugar. ? Are high in sodium. ? Have saturated fats or trans fats.  Limit how much you eat of the following foods: ? Prepackaged meals. ? Fast food. ? Fried foods. ? Processed meat, such as bacon, sausage, and deli meats. ? Fatty cuts of red meat and poultry with skin.  Cook foods in healthier ways, such as by baking, broiling, or grilling.  When grocery shopping, try to shop around the outside of the store. This helps you buy mostly fresh foods and avoid canned and prepackaged foods.  What lifestyle changes can be made?  Exercise at least 30 minutes 5 or more days each week. Exercising includes brisk walking, yard work, biking, running, swimming, and team sports like basketball and soccer. Ask your health care provider which exercises are safe for you.  Do not use any products that contain nicotine or tobacco, such as cigarettes and e-cigarettes. If you need help quitting, ask your health care provider.  Limit alcohol intake to no more than 1 drink  a day for nonpregnant women and 2 drinks a day for men. One drink equals 12 oz of beer, 5 oz of wine, or 1 oz of hard liquor.  Try to get 7-9 hours of sleep each night. What other changes can be made?  Keep a food and activity journal to keep track of: ? What you ate and how many calories you had. Remember to count sauces, dressings, and side dishes. ? Whether you were active, and what exercises you did. ? Your calorie, weight, and activity goals.  Check your weight regularly. Track any changes. If you notice you have gained weight, make changes to your diet or activity routine.  Avoid taking weight-loss medicines or supplements. Talk to your health  care provider before starting any new medicine or supplement.  Talk to your health care provider before trying any new diet or exercise plan. Why are these changes important? Eating healthy, staying active, and having healthy habits not only help prevent obesity, they also:  Help you to manage stress and emotions.  Help you to connect with friends and family.  Improve your self-esteem.  Improve your sleep.  Prevent long-term health problems.  What can happen if changes are not made? Being obese or overweight can cause you to develop joint or bone problems, which can make it hard for you to stay active or do activities you enjoy. Being obese or overweight also puts stress on your heart and lungs and can lead to health problems like diabetes, heart disease, and some cancers. Where to find more information: Talk with your health care provider or a dietitian about healthy eating and healthy lifestyle choices. You may also find other information through these resources:  U.S. Department of Agriculture MyPlate: FormerBoss.no  American Heart Association: www.heart.org  Centers for Disease Control and Prevention: http://www.wolf.info/  Summary  Staying at a healthy weight is important. It helps prevent certain diseases and health problems,  such as heart disease, diabetes, joint problems, sleep disorders, and some cancers.  Being obese or overweight can cause you to develop joint or bone problems, which can make it hard for you to stay active or do activities you enjoy.  You can prevent unhealthy weight gain by eating a healthy diet, exercising regularly, not smoking, limiting alcohol, and getting enough sleep.  Talk with your health care provider or a dietitian for guidance about healthy eating and healthy lifestyle choices. This information is not intended to replace advice given to you by your health care provider. Make sure you discuss any questions you have with your health care provider. Document Released: 01/06/2016 Document Revised: 02/11/2016 Document Reviewed: 02/11/2016 Elsevier Interactive Patient Education  [employer].

## 2017-08-15 NOTE — Progress Notes (Signed)
BP 136/70 (BP Location: Left Arm, Patient Position: Sitting, Cuff Size: Normal)   Pulse 85   Temp 98.7 F (37.1 C) (Oral)   Resp 16   Ht 5\' 6"  (1.676 m)   Wt 225 lb (102.1 kg)   LMP  (LMP Unknown)   SpO2 97%   BMI 36.32 kg/m    Subjective:    Patient ID: Allison Phillips, female    DOB: 30-Apr-1950, 67 y.o.   MRN: [id number]  HPI: Allison Phillips is a 67 y.o. female  Chief Complaint  Patient presents with  . Ear Pain    right ear pain x 2 weeks. Pain is intermittent and aches. She has a deep pain at least 3-4 times  daily. She had some blackheads removed on July 3.    HPI Patient is here for an acute visit  The dermatologist cleaned out some blackheads; dermatologist did that; she says she had not idea that it was going to be so painful; she had to really press down No fevers; that was just the right ear Whooshing or like water or wind at times and that was just since the blackheads were cleaned out  Obesity; walking 2 miles a day; asked about B12; drinking water; quit smoking, eating everything in sight  Depression screen [facility] 2/9 08/15/2017 07/07/2017 03/18/2017 12/30/2016 12/06/2016  Decreased Interest 0 0 0 0 0  Down, Depressed, Hopeless 0 0 0 0 0  PHQ - 2 Score 0 0 0 0 0  Altered sleeping 0 - - - -  Tired, decreased energy 1 - - - -  Change in appetite 1 - - - -  Feeling bad or failure about yourself  0 - - - -  Trouble concentrating 0 - - - -  Moving slowly or fidgety/restless 0 - - - -  Suicidal thoughts 0 - - - -  PHQ-9 Score 2 - - - -  Difficult doing work/chores Not difficult at all - - - -    Relevant past medical, surgical, family and social history reviewed Past Medical History:  Diagnosis Date  . Bursitis of left hip   . Depression   . Diverticulitis   . Hypertension   . Joint pain   . Obesity   . Osteopenia 04/20/2017   April 2019; next DEXA April 2021  . Osteoporosis   . Prediabetes   . Reflux   . Tobacco use    Past Surgical History:    Procedure Laterality Date  . ANAL FISSURE REPAIR    . APPENDECTOMY    . [place]  . COLONOSCOPY WITH PROPOFOL N/A 12/18/2014   Procedure: COLONOSCOPY WITH PROPOFOL;  Surgeon: Christene Lye, MD;  Location: [facility] ENDOSCOPY;  Service: Endoscopy;  Laterality: N/A;  . LIPOMA EXCISION  07/2015   right shoulder  . LIPOMA EXCISION  11/19/2015  . OOPHORECTOMY     for "fibroids" not sure which ovary   Family History  Problem Relation Age of Onset  . Hypertension Mother   . Kidney disease Mother   . Alzheimer's disease Mother   . Breast cancer Neg Hx    Social History   Tobacco Use  . Smoking status: Former Smoker    Packs/day: 0.50    Years: 15.00    Pack years: 7.50    Types: Cigarettes    Last attempt to quit: 10/24/2016    Years since quitting: 0.8  . Smokeless tobacco: Never Used  Substance Use Topics  .  Alcohol use: Yes    Alcohol/week: 2.4 oz    Types: 4 Standard drinks or equivalent per week    Comment: on occasion  . Drug use: No    Interim medical history since last visit reviewed. Allergies and medications reviewed  Review of Systems Per HPI unless specifically indicated above     Objective:    BP 136/70 (BP Location: Left Arm, Patient Position: Sitting, Cuff Size: Normal)   Pulse 85   Temp 98.7 F (37.1 C) (Oral)   Resp 16   Ht 5\' 6"  (1.676 m)   Wt 225 lb (102.1 kg)   LMP  (LMP Unknown)   SpO2 97%   BMI 36.32 kg/m   Wt Readings from Last 3 Encounters:  08/15/17 225 lb (102.1 kg)  07/07/17 223 lb 3.2 oz (101.2 kg)  03/18/17 218 lb 14.4 oz (99.3 kg)    Physical Exam  Constitutional: She appears well-developed and well-nourished. No distress.  HENT:  Right Ear: Tympanic membrane, external ear and ear canal normal. Tympanic membrane is not perforated and not erythematous. No middle ear effusion.  Left Ear: Tympanic membrane, external ear and ear canal normal. Tympanic membrane is not perforated and not erythematous.  No middle ear  effusion.  Nose: No rhinorrhea.  Mouth/Throat: No posterior oropharyngeal edema or posterior oropharyngeal erythema.  Eyes: EOM are normal. No scleral icterus.  Neck: No thyromegaly present.  Cardiovascular: Normal rate.  Pulmonary/Chest: Effort normal.  Abdominal: She exhibits no distension.  Skin: No pallor.  Psychiatric: She has a normal mood and affect. Her behavior is normal. Judgment and thought content normal.    Results for orders placed or performed in visit on 07/12/17  HM DIABETES EYE EXAM  Result Value Ref Range   HM Diabetic Eye Exam No Retinopathy No Retinopathy      Assessment & Plan:   Problem List Items Addressed This Visit    None       Follow up plan: No follow-ups on file.  An after-visit summary was printed and given to the patient at [place].  Please see the patient instructions which may contain other information and recommendations beyond what is mentioned above in the assessment and plan.  No orders of the defined types were placed in this encounter.   No orders of the defined types were placed in this encounter.

## 2017-08-23 NOTE — Assessment & Plan Note (Signed)
Diabetes, chol with obesity; encouraged her to keep working on weight loss

## 2017-08-29 ENCOUNTER — Ambulatory Visit: Payer: Self-pay

## 2017-08-29 NOTE — Telephone Encounter (Signed)
  In coming call from Patient with complaint of moderate pain when swallowing. Patient states its in the upper torso area.  Hurts with burping or swallowing.  Denies that it radiates.  Started Sunday.  Last minutes. Denies Cardiac risk factors.  Denies Pulmonary risk factors.  Prior smoker.  Stop smoking last October.  Provided care advice.  Patient request appointment.  Scheduled appointment for Wednesday, 08/31/17 11:20am  With Dr.  Sanda Klein. Patient voiced understandanding.        Reason for Disposition . Chest pain(s) lasting a few seconds from coughing  Answer Assessment - Initial Assessment Questions 1. LOCATION: "Where does it hurt?"       Middle of chest  Of upper torso hurt if burping of swallowing. 2. RADIATION: "Does the pain go anywhere else?" (e.g., into neck, jaw, arms, back)no   3. ONSET: "When did the chest pain begin?" (Minutes, hours or days)      Started Sunday 4. PATTERN "Does the pain come and go, or has it been constant since it started?"  "Does it get worse with exertion?"      Only when swallowing 5. DURATION: "How long does it last" (e.g., seconds, minutes, hours)     minutes 6. SEVERITY: "How bad is the pain?"  (e.g., Scale 1-10; mild, moderate, or severe)    - MILD (1-3): doesn't interfere with normal activities     - MODERATE (4-7): interferes with normal activities or awakens from sleep    - SEVERE (8-10): excruciating pain, unable to do any normal activities       moderate 7. CARDIAC RISK FACTORS: "Do you have any history of heart problems or risk factors for heart disease?" (e.g., prior heart attack, angina; high blood pressure, diabetes, being overweight, high cholesterol, smoking, or strong family history of heart disease)     no 8. PULMONARY RISK FACTORS: "Do you have any history of lung disease?"  (e.g., blood clots in lung, asthma, emphysema, birth control pills)     no 9. CAUSE: "What do you think is causing the chest pain?"     I dont know not coughing prior  smoker quit las Oct 10. OTHER SYMPTOMS: "Do you have any other symptoms?" (e.g., dizziness, nausea, vomiting, sweating, fever, difficulty breathing, cough)       no 11. PREGNANCY: "Is there any chance you are pregnant?" "When was your last menstrual period?"     na  Protocols used: CHEST PAIN-A-AH

## 2017-08-31 ENCOUNTER — Ambulatory Visit
Admission: RE | Admit: 2017-08-31 | Discharge: 2017-08-31 | Disposition: A | Discharge status: Home / Self Care | Payer: Medicare Other | Source: Ambulatory Visit | Attending: Family Medicine | Admitting: Family Medicine

## 2017-08-31 ENCOUNTER — Encounter: Payer: Self-pay | Admitting: Family Medicine

## 2017-08-31 ENCOUNTER — Ambulatory Visit (INDEPENDENT_AMBULATORY_CARE_PROVIDER_SITE_OTHER): Payer: Medicare Other | Admitting: Family Medicine

## 2017-08-31 VITALS — BP 122/70 | HR 81 | Temp 98.2°F | Ht 65.0 in | Wt 216.7 lb

## 2017-08-31 DIAGNOSIS — R1319 Other dysphagia: Secondary | ICD-10-CM

## 2017-08-31 DIAGNOSIS — Z6836 Body mass index (BMI) 36.0-36.9, adult: Secondary | ICD-10-CM | POA: Diagnosis not present

## 2017-08-31 DIAGNOSIS — R1310 Dysphagia, unspecified: Secondary | ICD-10-CM

## 2017-08-31 DIAGNOSIS — Z87891 Personal history of nicotine dependence: Secondary | ICD-10-CM

## 2017-08-31 DIAGNOSIS — R634 Abnormal weight loss: Secondary | ICD-10-CM | POA: Diagnosis not present

## 2017-08-31 DIAGNOSIS — R079 Chest pain, unspecified: Secondary | ICD-10-CM | POA: Diagnosis not present

## 2017-08-31 NOTE — Progress Notes (Signed)
BP 122/70   Pulse 81   Temp 98.2 F (36.8 C)   Ht 5\' 5"  (1.651 m)   Wt 216 lb 11.2 oz (98.3 kg)   LMP  (LMP Unknown)   SpO2 94%   BMI 36.06 kg/m    Subjective:    Patient ID: Allison Phillips, female    DOB: 30-Apr-1950, 67 y.o.   MRN: [id number]  HPI: Allison Phillips is a 67 y.o. female  Chief Complaint  Patient presents with  . Dysphagia    Onset Saturday morning.  pt states food feels like it gets stuck.  She has lost weight and concerned because both her brothers have recently been dx with cancer  MD note: she does NOT have aphasia   HPI  Patient is here reporting trouble swallowing; started on Saturday morning No difficulty breathing; not a sore throat Down under the sternal notch Conscious of the food going down, right under the notch Can feel food go down; when she burps, it feels like it expands in the upper chest; just a little painful, not real bad No acid reflux No dark stools or blood in the stool No fam hx known of esophagus problems, but lots of cancer in the family She is a former smoker; about 1/2 ppd for 20 years; quit last October (2018); she is chewing this gum, nicorette gum Did a piece of that at church and got hiccups so bad;  Not many hot liquids; one cup of coffee in the morning  She has lost weight; walking and replaced one meal a day with a shake; down 8-1/4 pounds in just 2 weeks according to our scales  Depression screen [facility] 2/9 08/31/2017 08/15/2017 07/07/2017 03/18/2017 12/30/2016  Decreased Interest 0 0 0 0 0  Down, Depressed, Hopeless 0 0 0 0 0  PHQ - 2 Score 0 0 0 0 0  Altered sleeping - 0 - - -  Tired, decreased energy - 1 - - -  Change in appetite - 1 - - -  Feeling bad or failure about yourself  - 0 - - -  Trouble concentrating - 0 - - -  Moving slowly or fidgety/restless - 0 - - -  Suicidal thoughts - 0 - - -  PHQ-9 Score - 2 - - -  Difficult doing work/chores - Not difficult at all - - -    Relevant past medical, surgical,  family and social history reviewed Past Medical History:  Diagnosis Date  . Bursitis of left hip   . Depression   . Diverticulitis   . Hypertension   . Joint pain   . Obesity   . Osteopenia 04/20/2017   April 2019; next DEXA April 2021  . Osteoporosis   . Prediabetes   . Reflux   . Tobacco use    Past Surgical History:  Procedure Laterality Date  . ANAL FISSURE REPAIR    . APPENDECTOMY    . [place]  . COLONOSCOPY WITH PROPOFOL N/A 12/18/2014   Procedure: COLONOSCOPY WITH PROPOFOL;  Surgeon: Christene Lye, MD;  Location: [facility] ENDOSCOPY;  Service: Endoscopy;  Laterality: N/A;  . LIPOMA EXCISION  07/2015   right shoulder  . LIPOMA EXCISION  11/19/2015  . OOPHORECTOMY     for "fibroids" not sure which ovary   Family History  Problem Relation Age of Onset  . Hypertension Mother   . Kidney disease Mother   . Alzheimer's disease Mother   . Lung cancer  Brother   . Rectal cancer Brother   . Breast cancer Neg Hx    Social History   Tobacco Use  . Smoking status: Former Smoker    Packs/day: 0.50    Years: 15.00    Pack years: 7.50    Types: Cigarettes    Last attempt to quit: 10/24/2016    Years since quitting: 0.8  . Smokeless tobacco: Never Used  Substance Use Topics  . Alcohol use: Yes    Alcohol/week: 4.0 standard drinks    Types: 4 Standard drinks or equivalent per week    Comment: on occasion  . Drug use: No    Interim medical history since last visit reviewed. Allergies and medications reviewed  Review of Systems Per HPI unless specifically indicated above     Objective:    BP 122/70   Pulse 81   Temp 98.2 F (36.8 C)   Ht 5\' 5"  (1.651 m)   Wt 216 lb 11.2 oz (98.3 kg)   LMP  (LMP Unknown)   SpO2 94%   BMI 36.06 kg/m   Wt Readings from Last 3 Encounters:  08/31/17 216 lb 11.2 oz (98.3 kg)  08/15/17 225 lb (102.1 kg)  07/07/17 223 lb 3.2 oz (101.2 kg)    Physical Exam  Constitutional: She appears well-developed and  well-nourished.  Weight loss noted since last visit  HENT:  Mouth/Throat: Mucous membranes are normal. No posterior oropharyngeal edema or posterior oropharyngeal erythema.  Eyes: EOM are normal. No scleral icterus.  Cardiovascular: Normal rate and regular rhythm.  Pulmonary/Chest: Effort normal and breath sounds normal.  Abdominal: Soft. Bowel sounds are normal. There is no tenderness.  Lymphadenopathy:    She has no cervical adenopathy.       Right: No supraclavicular adenopathy present.       Left: No supraclavicular adenopathy present.  Psychiatric: She has a normal mood and affect. Her behavior is normal.    Results for orders placed or performed in visit on 07/12/17  HM DIABETES EYE EXAM  Result Value Ref Range   HM Diabetic Eye Exam No Retinopathy No Retinopathy      Assessment & Plan:   Problem List Items Addressed This Visit    None    Visit Diagnoses    Esophageal dysphagia    -  Primary   discussed ddx, including stricture, mass, compression; will refer to GI for consideration of EGD; will get chest CT if not primarily GI problem   Relevant Orders   Ambulatory referral to Gastroenterology   DG Chest 2 View   Odynophagia       refer to GI   Relevant Orders   Ambulatory referral to Gastroenterology   DG Chest 2 View   Former cigarette smoker       still using nicotine gum; reviewed potentional side effects of gum including belching, reflux, hiccups, etc, irritant; she may stop   Relevant Orders   DG Chest 2 View   Weight loss       weight loss note, discussed; hx of smoking; getting CXR today; refer to GI; if nothing GI found will follow with chest CT   Relevant Orders   DG Chest 2 View       Follow up plan: No follow-ups on file.  An after-visit summary was printed and given to the patient at [place].  Please see the patient instructions which may contain other information and recommendations beyond what is mentioned above in the assessment and  plan.  No orders of the defined types were placed in this encounter.   Orders Placed This Encounter  Procedures  . DG Chest 2 View  . Ambulatory referral to Gastroenterology

## 2017-08-31 NOTE — Patient Instructions (Signed)
We'll have you see the gastroenterologist Please have the xray done across the street If you have not heard anything from my staff in a week about any orders/referrals/studies from today, please contact us here to follow-up [phone] Let's do further work-up with a chest CT if issue ongoing Keep me posted with any changes

## 2017-09-01 ENCOUNTER — Telehealth: Payer: Self-pay

## 2017-09-01 DIAGNOSIS — R1310 Dysphagia, unspecified: Secondary | ICD-10-CM

## 2017-09-01 NOTE — Telephone Encounter (Signed)
-----   Message from Arnetha Courser, MD sent at 08/31/2017  5:41 PM EDT ----- Roselyn Reef, please let the patient know that her chest xray did not show anything worrisome. Please see how soon she can get in to see the GI specialist. If it will be more than 7-10 days, please ORDER a barium swallow, dx: dysphagia

## 2017-09-08 ENCOUNTER — Ambulatory Visit
Admission: RE | Admit: 2017-09-08 | Discharge: 2017-09-08 | Disposition: A | Discharge status: Home / Self Care | Payer: Medicare Other | Source: Ambulatory Visit | Attending: Family Medicine | Admitting: Family Medicine

## 2017-09-08 DIAGNOSIS — K219 Gastro-esophageal reflux disease without esophagitis: Secondary | ICD-10-CM | POA: Diagnosis not present

## 2017-09-08 DIAGNOSIS — R1310 Dysphagia, unspecified: Secondary | ICD-10-CM | POA: Insufficient documentation

## 2017-09-08 DIAGNOSIS — K449 Diaphragmatic hernia without obstruction or gangrene: Secondary | ICD-10-CM | POA: Insufficient documentation

## 2017-10-06 ENCOUNTER — Encounter: Payer: Self-pay | Admitting: Family Medicine

## 2017-10-06 ENCOUNTER — Ambulatory Visit (INDEPENDENT_AMBULATORY_CARE_PROVIDER_SITE_OTHER): Payer: Medicare Other | Admitting: Family Medicine

## 2017-10-06 VITALS — BP 124/80 | HR 86 | Temp 100.0°F | Resp 14 | Ht 65.0 in | Wt 219.3 lb

## 2017-10-06 DIAGNOSIS — N3001 Acute cystitis with hematuria: Secondary | ICD-10-CM

## 2017-10-06 DIAGNOSIS — K5792 Diverticulitis of intestine, part unspecified, without perforation or abscess without bleeding: Secondary | ICD-10-CM | POA: Diagnosis not present

## 2017-10-06 LAB — POCT URINALYSIS DIPSTICK
Bilirubin, UA: NEGATIVE
Glucose, UA: NEGATIVE
KETONES UA: NEGATIVE
NITRITE UA: NEGATIVE
PH UA: 6 (ref 5.0–8.0)
PROTEIN UA: POSITIVE — AB
Spec Grav, UA: 1.01 (ref 1.010–1.025)
UROBILINOGEN UA: 0.2 U/dL

## 2017-10-06 MED ORDER — CIPROFLOXACIN HCL 500 MG PO TABS: 500.0000 mg | ORAL_TABLET | Freq: Two times a day (BID) | ORAL | 0 refills | Status: AC

## 2017-10-06 MED ORDER — METRONIDAZOLE 500 MG PO TABS
500.0000 mg | ORAL_TABLET | Freq: Three times a day (TID) | ORAL | 0 refills | Status: AC
Start: 2017-10-06 — End: 2017-10-16

## 2017-10-06 NOTE — Progress Notes (Signed)
Name: Allison Phillips   MRN: [id number]    DOB: 1950-03-21   Date:10/06/2017       Progress Note  Subjective  Chief Complaint  Chief Complaint  Patient presents with  . Abdominal Pain    lower quadrant for 3 days, nausea, no appetite    HPI  PT presents with concern for LLQ abdominal pain that started 3 days ago.  She noticed the pain Tuesday evening in her low left lower quadrant, yesterday the pain was worse, she became tired.  She endorses ongoing pain today, nausea, decreased appetite, diarrhea. - Denies vomiting, blood in stool, dark/tarry stools, back pain, frank hematuria. - No history of kidney stones.  Has history of diverticulitis - has not had an episode in a few years. - She has a history diabetes, but last A1C was 5.5, and it appears A1C has been very well controlled for at least the last year. - Last kidney function reviewed - December 2018; also kidney function over the last 2 years appears to have been normal.  Patient Active Problem List   Diagnosis Date Noted  . CKD (chronic kidney disease) stage 2, GFR 60-89 ml/min 07/07/2017  . Osteopenia 04/20/2017  . Hip pain, chronic, left 03/18/2017  . Lower back pain 03/18/2017  . Sacroiliac joint pain 03/18/2017  . Morbid obesity ([place]) 12/30/2016  . Screening for osteoporosis 12/30/2016  . Preventative health care 12/07/2016  . Encounter for hepatitis C screening test for low risk patient 12/06/2016  . Controlled type 2 diabetes mellitus without complication, without long-term current use of insulin ([place]) 03/08/2016  . Essential hypertension, benign 03/08/2016  . Cervical pain (neck) 12/31/2014  . Lipoma of skin and subcutaneous tissue of neck 08/07/2014  . Reflux   . Depression   . Bursitis of left hip     Social History   Tobacco Use  . Smoking status: Former Smoker    Packs/day: 0.50    Years: 15.00    Pack years: 7.50    Types: Cigarettes    Last attempt to quit: 10/24/2016    Years since quitting: 0.9   . Smokeless tobacco: Never Used  Substance Use Topics  . Alcohol use: Yes    Alcohol/week: 4.0 standard drinks    Types: 4 Standard drinks or equivalent per week    Comment: on occasion    Current Outpatient Medications:  .  Cholecalciferol (VITAMIN D3) 20 MCG TABS, Take 25 mcg by mouth daily., Disp: , Rfl:  .  Cyanocobalamin (B-12) 500 MCG TABS, Take 2,500 mcg by mouth daily. , Disp: , Rfl:  .  Echinacea 125 MG CAPS, Take 400 mg as needed by mouth. , Disp: , Rfl:  .  Multiple Vitamin (MULTIVITAMIN) tablet, Take 1 tablet by mouth daily., Disp: , Rfl:  .  Omega-3 Fatty Acids (FISH OIL PO), Take 1,200 mg daily by mouth. , Disp: , Rfl:  .  Turmeric 500 MG CAPS, Take 300 mg by mouth daily. , Disp: , Rfl:  .  HYDROcodone-acetaminophen (NORCO/VICODIN) 5-325 MG tablet, Take 1 tablet by mouth every 6 (six) hours as needed for moderate pain. (Patient not taking: Reported on 08/15/2017), Disp: 20 tablet, Rfl: 0 .  meloxicam (MOBIC) 7.5 MG tablet, Take 1 tablet (7.5 mg total) by mouth daily as needed for pain., Disp: , Rfl:   No Known Allergies  I personally reviewed active problem list, medication list, allergies, most recent CT abdomen confirming diverticulosis with the patient/caregiver today.  ROS  Ten  systems reviewed and is negative except as mentioned in HPI  Objective  Vitals:   10/06/17 1317  BP: 124/80  Pulse: 86  Resp: 14  Temp: 100 F (37.8 C)  TempSrc: Oral  SpO2: 95%  Weight: 219 lb 4.8 oz (99.5 kg)  Height: 5\' 5"  (1.651 m)    Body mass index is 36.49 kg/m.  Nursing Note and Vital Signs reviewed.  Physical Exam  Constitutional: Patient appears well-developed and well-nourished. No distress.  HENT: Head: Normocephalic and atraumatic. Ears: bilateral TMs with no erythema or effusion; Nose: Nose normal. Mouth/Throat: Oropharynx is clear and moist. No oropharyngeal exudate or tonsillar swelling.  Eyes: Conjunctivae and EOM are normal. No scleral icterus.  Pupils are  equal, round, and reactive to light.  Neck: Normal range of motion. Neck supple. No JVD present. No thyromegaly present.  Cardiovascular: Normal rate, regular rhythm and normal heart sounds.  No murmur heard. No BLE edema. Pulmonary/Chest: Effort normal and breath sounds normal. No respiratory distress. Abdominal: Soft. Bowel sounds are normal, no distension. LEFT lower quadrant is tender to palpation, no rebound.  On palpation of RLQ, there is pain in the LLQ. Musculoskeletal: Normal range of motion, no joint effusions. No gross deformities Neurological: Pt is alert and oriented to person, place, and time. No cranial nerve deficit. Coordination, balance, strength, speech and gait are normal.  Skin: Skin is warm and dry. No rash noted. No erythema.  Psychiatric: Patient has a normal mood and affect. behavior is normal. Judgment and thought content normal.   Results for orders placed or performed in visit on 10/06/17 (from the past 72 hour(s))  POCT urinalysis dipstick     Status: Abnormal   Collection Time: 10/06/17  1:28 PM  Result Value Ref Range   Color, UA gold    Clarity, UA cloudy    Glucose, UA Negative Negative   Bilirubin, UA negative    Ketones, UA negative    Spec Grav, UA 1.010 1.010 - 1.025   Blood, UA large    pH, UA 6.0 5.0 - 8.0   Protein, UA Positive (A) Negative   Urobilinogen, UA 0.2 0.2 or 1.0 E.U./dL   Nitrite, UA negative    Leukocytes, UA Large (3+) (A) Negative   Appearance cloudy    Odor none     Assessment & Plan  1. Diverticulitis - Strict return precautions discussed. - metroNIDAZOLE (FLAGYL) 500 MG tablet; Take 1 tablet (500 mg total) by mouth 3 (three) times daily for 10 days.  Dispense: 30 tablet; Refill: 0 - ciprofloxacin (CIPRO) 500 MG tablet; Take 1 tablet (500 mg total) by mouth 2 (two) times daily for 10 days.  Dispense: 20 tablet; Refill: 0  2. Acute cystitis with hematuria - POCT urinalysis dipstick - ciprofloxacin (CIPRO) 500 MG tablet;  Take 1 tablet (500 mg total) by mouth 2 (two) times daily for 10 days.  Dispense: 20 tablet; Refill: 0  -Red flags and when to present for emergency care or RTC including fever >101.71F, chest pain, shortness of breath, new/worsening/un-resolving symptoms, bloating/sudden rigid the abdomen, signs of blood in stool reviewed with patient at time of visit. Follow up and care instructions discussed and provided in AVS.  - Discussed plan of care with PCP Dr. Sanda Klein who is in agreement.

## 2017-10-06 NOTE — Patient Instructions (Signed)

## 2017-10-11 ENCOUNTER — Encounter: Payer: Self-pay | Admitting: Gastroenterology

## 2017-10-11 ENCOUNTER — Ambulatory Visit (INDEPENDENT_AMBULATORY_CARE_PROVIDER_SITE_OTHER): Payer: Medicare Other | Admitting: Gastroenterology

## 2017-10-11 VITALS — BP 129/75 | HR 64 | Ht 65.0 in | Wt 217.8 lb

## 2017-10-11 DIAGNOSIS — K5730 Diverticulosis of large intestine without perforation or abscess without bleeding: Secondary | ICD-10-CM | POA: Diagnosis not present

## 2017-10-11 NOTE — Patient Instructions (Signed)
F/U 3 months Miralax daily If pain gets worse/or returns, please contact office.  High-Fiber Diet Fiber, also called dietary fiber, is a type of carbohydrate found in fruits, vegetables, whole grains, and beans. A high-fiber diet can have many health benefits. Your health care provider may recommend a high-fiber diet to help:  Prevent constipation. Fiber can make your bowel movements more regular.  Lower your cholesterol.  Relieve hemorrhoids, uncomplicated diverticulosis, or irritable bowel syndrome.  Prevent overeating as part of a weight-loss plan.  Prevent heart disease, type 2 diabetes, and certain cancers.  What is my plan? The recommended daily intake of fiber includes:  38 grams for men under age 6.  41 grams for men over age 26.  22 grams for women under age 57.  63 grams for women over age 72.  You can get the recommended daily intake of dietary fiber by eating a variety of fruits, vegetables, grains, and beans. Your health care provider may also recommend a fiber supplement if it is not possible to get enough fiber through your diet. What do I need to know about a high-fiber diet?  Fiber supplements have not been widely studied for their effectiveness, so it is better to get fiber through food sources.  Always check the fiber content on thenutrition facts label of any prepackaged food. Look for foods that contain at least 5 grams of fiber per serving.  Ask your dietitian if you have questions about specific foods that are related to your condition, especially if those foods are not listed in the following section.  Increase your daily fiber consumption gradually. Increasing your intake of dietary fiber too quickly may cause bloating, cramping, or gas.  Drink plenty of water. Water helps you to digest fiber. What foods can I eat? Grains Whole-grain breads. Multigrain cereal. Oats and oatmeal. Brown rice. Barley. Bulgur wheat. Crane. Bran muffins. Popcorn. Rye  wafer crackers. Vegetables Sweet potatoes. Spinach. Kale. Artichokes. Cabbage. Broccoli. Green peas. Carrots. Squash. Fruits Berries. Pears. Apples. Oranges. Avocados. Prunes and raisins. Dried figs. Meats and Other Protein Sources Navy, kidney, pinto, and soy beans. Split peas. Lentils. Nuts and seeds. Dairy Fiber-fortified yogurt. Beverages Fiber-fortified soy milk. Fiber-fortified orange juice. Other Fiber bars. The items listed above may not be a complete list of recommended foods or beverages. Contact your dietitian for more options. What foods are not recommended? Grains White bread. Pasta made with refined flour. White rice. Vegetables Fried potatoes. Canned vegetables. Well-cooked vegetables. Fruits Fruit juice. Cooked, strained fruit. Meats and Other Protein Sources Fatty cuts of meat. Fried Sales executive or fried fish. Dairy Milk. Yogurt. Cream cheese. Sour cream. Beverages Soft drinks. Other Cakes and pastries. Butter and oils. The items listed above may not be a complete list of foods and beverages to avoid. Contact your dietitian for more information. What are some tips for including high-fiber foods in my diet?  Eat a wide variety of high-fiber foods.  Make sure that half of all grains consumed each day are whole grains.  Replace breads and cereals made from refined flour or white flour with whole-grain breads and cereals.  Replace white rice with brown rice, bulgur wheat, or millet.  Start the day with a breakfast that is high in fiber, such as a cereal that contains at least 5 grams of fiber per serving.  Use beans in place of meat in soups, salads, or pasta.  Eat high-fiber snacks, such as berries, raw vegetables, nuts, or popcorn. This information is not intended to replace  advice given to you by your health care provider. Make sure you discuss any questions you have with your health care provider. Document Released: 01/04/2005 Document Revised: 06/12/2015  Document Reviewed: 06/19/2013 Elsevier Interactive Patient Education  [employer].

## 2017-10-12 NOTE — Progress Notes (Signed)
Allison Phillips [place], [place] [address]  Main: [phone]  Fax: [phone]   Gastroenterology Consultation  Referring Provider:     Arnetha Courser, MD Primary Care Physician:  Arnetha Courser, MD Primary Gastroenterologist:  Dr. Vonda Phillips Reason for Consultation:             HPI:    Chief Complaint  Patient presents with  . New Patient (Initial Visit)    referred by Dr. Sanda Klein for Esophageal Dysphagia    Allison Phillips is a 67 y.o. y/o female referred for consultation & management  by Dr. Sanda Klein, Satira Anis, MD.  67 year old female who describes one episode 3 weeks ago, where she states she felt as if an air bubble was going down her esophagus, and it eventually passed and she has had no other similar symptoms before or since then.  No dysphagia with solids or liquids.  No globus sensation.  No heartburn.  No chest pain.  No weight loss.  No altered bowel habits.  No diarrhea or constipation.  She also reports history of diverticulitis and states she visited her primary care physician office on September 19 due to left lower quadrant abdominal pain and was treated with antibiotics for diverticulitis.  No imaging was done at that time.  After the antibiotics her pain completely resolved.  No blood in stool.  No melena.  Last imaging was in July 2018 which reported diverticulosis but no diverticulitis.  CT abdomen from January 2015 does report acute sigmoid diverticulitis.  Last colonoscopy November 2016 by Dr. Jamal Collin, reported diverticulosis, small rectal polyps removed and pathology showed hyperplastic polyps.  Past Medical History:  Diagnosis Date  . Bursitis of left hip   . Depression   . Diverticulitis   . Hypertension   . Joint pain   . Obesity   . Osteopenia 04/20/2017   April 2019; next DEXA April 2021  . Osteoporosis   . Prediabetes   . Reflux   . Tobacco use     Past Surgical History:  Procedure Laterality Date  .  ANAL FISSURE REPAIR    . APPENDECTOMY    . [place]  . COLONOSCOPY WITH PROPOFOL N/A 12/18/2014   Procedure: COLONOSCOPY WITH PROPOFOL;  Surgeon: Christene Lye, MD;  Location: [facility] ENDOSCOPY;  Service: Endoscopy;  Laterality: N/A;  . LIPOMA EXCISION  07/2015   right shoulder  . LIPOMA EXCISION  11/19/2015  . OOPHORECTOMY     for "fibroids" not sure which ovary    Prior to Admission medications   Medication Sig Start Date End Date Taking? Authorizing Provider  Cholecalciferol (VITAMIN D3) 20 MCG TABS Take 25 mcg by mouth daily.   Yes [provider]  ciprofloxacin (CIPRO) 500 MG tablet Take 1 tablet (500 mg total) by mouth 2 (two) times daily for 10 days. 10/06/17 10/16/17 Yes Allison Hartshorn, FNP  Cyanocobalamin (B-12) 500 MCG TABS Take 2,500 mcg by mouth daily.    Yes [provider]  Echinacea 125 MG CAPS Take 400 mg as needed by mouth.    Yes [provider]  metroNIDAZOLE (FLAGYL) 500 MG tablet Take 1 tablet (500 mg total) by mouth 3 (three) times daily for 10 days. 10/06/17 10/16/17 Yes Allison Hartshorn, FNP  Multiple Vitamin (MULTIVITAMIN) tablet Take 1 tablet by mouth daily.   Yes [provider]  Omega-3 Fatty Acids (FISH OIL PO) Take 1,200 mg daily by mouth.  Yes [provider]  Turmeric 500 MG CAPS Take 300 mg by mouth daily.    Yes [provider]  HYDROcodone-acetaminophen (NORCO/VICODIN) 5-325 MG tablet Take 1 tablet by mouth every 6 (six) hours as needed for moderate pain. Patient not taking: Reported on 08/15/2017 03/18/17   Arnetha Courser, MD  meloxicam (MOBIC) 7.5 MG tablet Take 1 tablet (7.5 mg total) by mouth daily as needed for pain. 08/15/17   Arnetha Courser, MD    Family History  Problem Relation Age of Onset  . Hypertension Mother   . Kidney disease Mother   . Alzheimer's disease Mother   . Lung cancer Brother   . Rectal cancer Brother   . Breast cancer Neg Hx      Social History    Tobacco Use  . Smoking status: Former Smoker    Packs/day: 0.50    Years: 15.00    Pack years: 7.50    Types: Cigarettes    Last attempt to quit: 10/24/2016    Years since quitting: 0.9  . Smokeless tobacco: Never Used  Substance Use Topics  . Alcohol use: Yes    Alcohol/week: 4.0 standard drinks    Types: 4 Standard drinks or equivalent per week    Comment: on occasion  . Drug use: No    Allergies as of 10/11/2017  . (No Known Allergies)    Review of Systems:    All systems reviewed and negative except where noted in HPI.   Physical Exam:  BP 129/75   Pulse 64   Ht 5\' 5"  (1.651 m)   Wt 217 lb 12.8 oz (98.8 kg)   LMP  (LMP Unknown)   BMI 36.24 kg/m  No LMP recorded (lmp unknown). Patient is postmenopausal. Psych:  Alert and cooperative. Normal mood and affect. General:   Alert,  Well-developed, well-nourished, pleasant and cooperative in NAD Head:  Normocephalic and atraumatic. Eyes:  Sclera clear, no icterus.   Conjunctiva pink. Ears:  Normal auditory acuity. Nose:  No deformity, discharge, or lesions. Mouth:  No deformity or lesions,oropharynx pink & moist. Neck:  Supple; no masses or thyromegaly. Abdomen:  Normal bowel sounds.  No bruits.  Soft, non-tender and non-distended without masses, hepatosplenomegaly or hernias noted.  No guarding or rebound tenderness.    Msk:  Symmetrical without gross deformities. Good, equal movement & strength bilaterally. Pulses:  Normal pulses noted. Extremities:  No clubbing or edema.  No cyanosis. Neurologic:  Alert and oriented x3;  grossly normal neurologically. Skin:  Intact without significant lesions or rashes. No jaundice. Lymph Nodes:  No significant cervical adenopathy. Psych:  Alert and cooperative. Normal mood and affect.   Labs: CBC    Component Value Date/Time   WBC 6.5 07/23/2016 1421   RBC 4.60 07/23/2016 1421   HGB 13.1 07/23/2016 1421   HGB 12.8 11/26/2015 1429   HCT 40.0 07/23/2016 1421   HCT 38.5  11/26/2015 1429   PLT 307 07/23/2016 1421   PLT 307 11/26/2015 1429   MCV 87.0 07/23/2016 1421   MCV 84 11/26/2015 1429   MCH 28.5 07/23/2016 1421   MCHC 32.8 07/23/2016 1421   RDW 14.5 07/23/2016 1421   RDW 14.6 11/26/2015 1429   LYMPHSABS 2,535 07/23/2016 1421   LYMPHSABS 2.8 11/26/2015 1429   MONOABS 390 07/23/2016 1421   EOSABS 130 07/23/2016 1421   EOSABS 0.2 11/26/2015 1429   BASOSABS 65 07/23/2016 1421   BASOSABS 0.1 11/26/2015 1429   CMP  Component Value Date/Time   NA 141 12/24/2016 0813   NA 142 04/27/2016 1612   K 4.7 12/24/2016 0813   CL 104 12/24/2016 0813   CO2 28 12/24/2016 0813   GLUCOSE 90 12/24/2016 0813   BUN 15 12/24/2016 0813   BUN 20 04/27/2016 1612   CREATININE 0.86 12/24/2016 0813   CALCIUM 9.6 12/24/2016 0813   PROT 7.1 12/24/2016 0813   PROT 6.6 04/27/2016 1612   ALBUMIN 4.2 07/23/2016 1421   ALBUMIN 4.2 04/27/2016 1612   AST 15 12/24/2016 0813   ALT 10 12/24/2016 0813   ALKPHOS 52 07/23/2016 1421   BILITOT 0.4 12/24/2016 0813   BILITOT 0.3 04/27/2016 1612   GFRNONAA 71 12/24/2016 0813   GFRAA 82 12/24/2016 0813    Imaging Studies: See HPI, 2018 and 2015 CT report reviewed  Assessment and Plan:   ELIKA GODAR is a 67 y.o. y/o female has been referred for dysphagia and previous history of diverticulitis  Patient's symptom occurred 3 weeks ago as detailed in HPI, and does not correlate with a symptom of true dysphagia. Symptom has completely resolved  Esophagram from September 08, 2017 reviewed and showed small hiatal hernia, and mild reflux.  No other abnormalities reported.  No indication for EGD at this time given resolution of symptoms.  If symptoms recur I have asked the patient to call us and she verbalized understanding.  Due to her history of diverticulitis in the past I have asked her to maintain a high-fiber diet and start taking MiraLAX daily to prevent future episodes of diverticulitis and she verbalized  understanding If abdominal pain reoccurs I have asked her to call us and she verbalized understanding  Colonoscopy up-to-date Last documented episode of diverticulitis was in 2015 Latest CT scan was in July 2018 and did not reveal any colonic lesions and did not report diverticulitis  Her abdominal pain on October 06, 2017 episode may have been due to constipation, as it it was milder than her 2015 episode of diverticulitis.  Her relatively recent CT scan and colonoscopy within the last 5 years reveal no colon malignancy.  No indication for urgent colonoscopy at this time.  Follow-up in clinic in 3 to 4 months.  Dr Allison Phillips

## 2017-10-13 ENCOUNTER — Ambulatory Visit: Payer: Medicare Other | Admitting: Gastroenterology

## 2017-11-10 DIAGNOSIS — M7062 Trochanteric bursitis, left hip: Secondary | ICD-10-CM | POA: Diagnosis not present

## 2017-11-14 ENCOUNTER — Encounter: Payer: Self-pay | Admitting: Nurse Practitioner

## 2017-11-14 ENCOUNTER — Encounter

## 2017-11-14 ENCOUNTER — Ambulatory Visit (INDEPENDENT_AMBULATORY_CARE_PROVIDER_SITE_OTHER): Payer: Medicare Other | Admitting: Nurse Practitioner

## 2017-11-14 VITALS — BP 130/80 | HR 65 | Temp 98.3°F | Resp 16 | Ht 65.0 in | Wt 210.5 lb

## 2017-11-14 DIAGNOSIS — Z23 Encounter for immunization: Secondary | ICD-10-CM | POA: Diagnosis not present

## 2017-11-14 NOTE — Progress Notes (Signed)
Error; patient presented for GI referral, already has GI specialist was unaware she can contact them directly. Phone number given to contact specialist

## 2017-11-14 NOTE — Progress Notes (Signed)
Flu shot given

## 2017-12-01 ENCOUNTER — Ambulatory Visit (INDEPENDENT_AMBULATORY_CARE_PROVIDER_SITE_OTHER): Payer: Medicare Other | Admitting: Gastroenterology

## 2017-12-01 ENCOUNTER — Encounter: Payer: Self-pay | Admitting: Gastroenterology

## 2017-12-01 ENCOUNTER — Other Ambulatory Visit: Payer: Self-pay

## 2017-12-01 VITALS — BP 150/92 | HR 70 | Ht 65.0 in | Wt 215.6 lb

## 2017-12-01 DIAGNOSIS — Z8719 Personal history of other diseases of the digestive system: Secondary | ICD-10-CM

## 2017-12-01 DIAGNOSIS — Z1211 Encounter for screening for malignant neoplasm of colon: Secondary | ICD-10-CM

## 2017-12-01 NOTE — Progress Notes (Signed)
Allison Antigua, MD [address]  [place]  [place], [place] [address]  Main: [phone]  Fax: [phone]   Primary Care Physician: Arnetha Courser, MD  Primary Gastroenterologist:  Dr. Vonda Phillips  Chief Complaint  Patient presents with  . New Patient (Initial Visit)    Discuss diverticulitis and possible need for colonoscopy/EGD    HPI: Allison Phillips is a 67 y.o. female here for follow-up.  States dysphagia has completely resolved.  Eating well.  Good appetite.  No weight loss.  In September 2019 she had complained of left lower quadrant abdominal pain to her primary care provider and was treated with antibiotics for presumed diverticulitis.  States pain improved initially, but is still persistent and occurs 3-4 times a week in the left lower quadrant, cramping, dull, 3/10, nonradiating.  No fever or chills.  Last imaging was in July 2018 which reported diverticulosis but no diverticulitis.  CT abdomen from January 2015 does report acute sigmoid diverticulitis.  Last colonoscopy November 2016 by Dr. Jamal Collin, reported diverticulosis, small rectal polyps removed and pathology showed hyperplastic polyps.  Current Outpatient Medications  Medication Sig Dispense Refill  . aspirin EC 81 MG tablet Take 81 mg by mouth daily.    . Cholecalciferol (VITAMIN D3) 20 MCG TABS Take 25 mcg by mouth daily.    . Cyanocobalamin (B-12) 500 MCG TABS Take 2,500 mcg by mouth daily.     . Echinacea 125 MG CAPS Take 400 mg as needed by mouth.     . Multiple Vitamin (MULTIVITAMIN) tablet Take 1 tablet by mouth daily.    . Omega-3 Fatty Acids (FISH OIL PO) Take 1,200 mg daily by mouth.     . Turmeric 500 MG CAPS Take 300 mg by mouth daily.     Marland Kitchen HYDROcodone-acetaminophen (NORCO/VICODIN) 5-325 MG tablet Take 1 tablet by mouth every 6 (six) hours as needed for moderate pain. (Patient not taking: Reported on 12/01/2017) 20 tablet 0  . meloxicam (MOBIC) 7.5 MG tablet Take 1 tablet  (7.5 mg total) by mouth daily as needed for pain.     No current facility-administered medications for this visit.     Allergies as of 12/01/2017  . (No Known Allergies)    ROS:  General: Negative for anorexia, weight loss, fever, chills, fatigue, weakness. ENT: Negative for hoarseness, difficulty swallowing , nasal congestion. CV: Negative for chest pain, angina, palpitations, dyspnea on exertion, peripheral edema.  Respiratory: Negative for dyspnea at rest, dyspnea on exertion, cough, sputum, wheezing.  GI: See history of present illness. GU:  Negative for dysuria, hematuria, urinary incontinence, urinary frequency, nocturnal urination.  Endo: Negative for unusual weight change.    Physical Examination:   BP (!) 150/92   Pulse 70   Ht 5\' 5"  (1.651 m)   Wt 215 lb 9.6 oz (97.8 kg)   LMP  (LMP Unknown)   BMI 35.88 kg/m   General: Well-nourished, well-developed in no acute distress.  Eyes: No icterus. Conjunctivae pink. Mouth: Oropharyngeal mucosa moist and pink , no lesions erythema or exudate. Neck: Supple, Trachea midline Abdomen: Bowel sounds are normal, nontender, nondistended, no hepatosplenomegaly or masses, no abdominal bruits or hernia , no rebound or guarding.   Extremities: No lower extremity edema. No clubbing or deformities. Neuro: Alert and oriented x 3.  Grossly intact. Skin: Warm and dry, no jaundice.   Psych: Alert and cooperative, normal mood and affect.   Labs: CMP     Component Value Date/Time   NA  141 12/24/2016 0813   NA 142 04/27/2016 1612   K 4.7 12/24/2016 0813   CL 104 12/24/2016 0813   CO2 28 12/24/2016 0813   GLUCOSE 90 12/24/2016 0813   BUN 15 12/24/2016 0813   BUN 20 04/27/2016 1612   CREATININE 0.86 12/24/2016 0813   CALCIUM 9.6 12/24/2016 0813   PROT 7.1 12/24/2016 0813   PROT 6.6 04/27/2016 1612   ALBUMIN 4.2 07/23/2016 1421   ALBUMIN 4.2 04/27/2016 1612   AST 15 12/24/2016 0813   ALT 10 12/24/2016 0813   ALKPHOS 52 07/23/2016  1421   BILITOT 0.4 12/24/2016 0813   BILITOT 0.3 04/27/2016 1612   GFRNONAA 71 12/24/2016 0813   GFRAA 82 12/24/2016 0813   Lab Results  Component Value Date   WBC 6.5 07/23/2016   HGB 13.1 07/23/2016   HCT 40.0 07/23/2016   MCV 87.0 07/23/2016   PLT 307 07/23/2016    Imaging Studies: No results found.  Assessment and Plan:   Allison Phillips is a 67 y.o. y/o female with history of diverticulitis in the past here for follow-up  Left lower quadrant abdominal pain is likely due to constipation Patient does not have regular bowel movements and takes laxatives as needed High-fiber diet MiraLAX daily with goal of 1-2 soft bowel movements daily.  If not at goal, patient instructed to increase dose to twice daily.  If loose stools with the medication, patient asked to decrease the medication to every other day, or half dose daily.  Patient verbalized understanding  Her intermittent left lower quadrant abdominal pain at this time is not consistent with diverticulitis Given her previous history of diverticulitis we discussed conservative management versus colonoscopy. colonoscopy is indicated to rule out any colon malignancy.  However, given November 2016 colonoscopy risk of malignancy is low.  This was discussed and patient would like to proceed with colonoscopy for evaluation.  I have discussed alternative options, risks & benefits,  which include, but are not limited to, bleeding, infection, perforation,respiratory complication & drug reaction.  The patient agrees with this plan & written consent will be obtained.       Dr Allison Phillips

## 2017-12-03 ENCOUNTER — Emergency Department: Payer: Medicare Other

## 2017-12-03 ENCOUNTER — Emergency Department
Admission: EM | Admit: 2017-12-03 | Discharge: 2017-12-03 | Disposition: A | Discharge status: Home / Self Care | Payer: Medicare Other | Attending: Emergency Medicine | Admitting: Emergency Medicine

## 2017-12-03 ENCOUNTER — Encounter: Payer: Self-pay | Admitting: Emergency Medicine

## 2017-12-03 ENCOUNTER — Other Ambulatory Visit: Payer: Self-pay

## 2017-12-03 DIAGNOSIS — W108XXA Fall (on) (from) other stairs and steps, initial encounter: Secondary | ICD-10-CM | POA: Diagnosis not present

## 2017-12-03 DIAGNOSIS — N182 Chronic kidney disease, stage 2 (mild): Secondary | ICD-10-CM | POA: Insufficient documentation

## 2017-12-03 DIAGNOSIS — E1122 Type 2 diabetes mellitus with diabetic chronic kidney disease: Secondary | ICD-10-CM | POA: Diagnosis not present

## 2017-12-03 DIAGNOSIS — Y999 Unspecified external cause status: Secondary | ICD-10-CM | POA: Diagnosis not present

## 2017-12-03 DIAGNOSIS — S92355A Nondisplaced fracture of fifth metatarsal bone, left foot, initial encounter for closed fracture: Secondary | ICD-10-CM

## 2017-12-03 DIAGNOSIS — Z7982 Long term (current) use of aspirin: Secondary | ICD-10-CM | POA: Diagnosis not present

## 2017-12-03 DIAGNOSIS — Y939 Activity, unspecified: Secondary | ICD-10-CM | POA: Insufficient documentation

## 2017-12-03 DIAGNOSIS — I129 Hypertensive chronic kidney disease with stage 1 through stage 4 chronic kidney disease, or unspecified chronic kidney disease: Secondary | ICD-10-CM | POA: Diagnosis not present

## 2017-12-03 DIAGNOSIS — S92345A Nondisplaced fracture of fourth metatarsal bone, left foot, initial encounter for closed fracture: Secondary | ICD-10-CM

## 2017-12-03 DIAGNOSIS — Z87891 Personal history of nicotine dependence: Secondary | ICD-10-CM | POA: Diagnosis not present

## 2017-12-03 DIAGNOSIS — Y929 Unspecified place or not applicable: Secondary | ICD-10-CM | POA: Diagnosis not present

## 2017-12-03 DIAGNOSIS — S92302A Fracture of unspecified metatarsal bone(s), left foot, initial encounter for closed fracture: Secondary | ICD-10-CM | POA: Diagnosis not present

## 2017-12-03 DIAGNOSIS — M79672 Pain in left foot: Secondary | ICD-10-CM

## 2017-12-03 DIAGNOSIS — S99922A Unspecified injury of left foot, initial encounter: Secondary | ICD-10-CM | POA: Diagnosis present

## 2017-12-03 DIAGNOSIS — S92351A Displaced fracture of fifth metatarsal bone, right foot, initial encounter for closed fracture: Secondary | ICD-10-CM | POA: Diagnosis not present

## 2017-12-03 DIAGNOSIS — Z79899 Other long term (current) drug therapy: Secondary | ICD-10-CM | POA: Diagnosis not present

## 2017-12-03 MED ORDER — HYDROCODONE-ACETAMINOPHEN 5-325 MG PO TABS: 1.0000 | ORAL_TABLET | Freq: Three times a day (TID) | ORAL | 0 refills | Status: DC | PRN

## 2017-12-03 MED ORDER — HYDROCODONE-ACETAMINOPHEN 5-325 MG PO TABS
1.0000 | ORAL_TABLET | Freq: Once | ORAL | Status: AC
  Administered 2017-12-03: 1 via ORAL
  Filled 2017-12-03: qty 1

## 2017-12-03 NOTE — Discharge Instructions (Addendum)
You have been diagnosed with a fourth and fifth metatarsal fracture.  We have placed you in a posterior lower leg splint and given you crutches.  You should not bear weight on your left leg at this time.  Elevate your leg at home on pillows above the level of your heart.  I have given you prescription for Vicodin to take every 8 hours as needed.  Please continue meloxicam at this time.  Please follow-up with Ortho on Monday for further evaluation.

## 2017-12-03 NOTE — ED Notes (Signed)
See triage note  Presents with pain to left foot  States she was standing on step stool and the stool shut  She fell having pain to top of foot  No swelling   Good pulses

## 2017-12-03 NOTE — ED Triage Notes (Signed)
Pt had mechanical injury that caused pt to fall off of stool and cause injury to pt's left foot.

## 2017-12-03 NOTE — ED Provider Notes (Signed)
[facility] Emergency Department Provider Note ____________________________________________  Time seen: 1125 I have reviewed the triage vital signs and the nursing notes.  HISTORY  Chief Complaint  Foot Pain   HPI Allison Phillips is a 67 y.o. female presents to the ER today with complaint of left foot pain.  She reports about 45 minutes prior to arrival, she was standing on a step stool, and it buckled underneath her causing her to fall backwards on her left foot.  She was able to ambulate but with difficulty right after the accident.  She describes the pain is sore but can be sharp with movement or ambulation.  She has not noticed any swelling or bruising.  She denies numbness or tingling of the left lower extremity.  She did not take anything for this prior to arrival.  Past Medical History:  Diagnosis Date  . Bursitis of left hip   . Depression   . Diverticulitis   . Hypertension   . Joint pain   . Obesity   . Osteopenia 04/20/2017   April 2019; next DEXA April 2021  . Osteoporosis   . Prediabetes   . Reflux   . Tobacco use     Patient Active Problem List   Diagnosis Date Noted  . CKD (chronic kidney disease) stage 2, GFR 60-89 ml/min 07/07/2017  . Osteopenia 04/20/2017  . Hip pain, chronic, left 03/18/2017  . Lower back pain 03/18/2017  . Sacroiliac joint pain 03/18/2017  . Morbid obesity ([place]) 12/30/2016  . Screening for osteoporosis 12/30/2016  . Preventative health care 12/07/2016  . Encounter for hepatitis C screening test for low risk patient 12/06/2016  . Controlled type 2 diabetes mellitus without complication, without long-term current use of insulin ([place]) 03/08/2016  . Essential hypertension, benign 03/08/2016  . Cervical pain (neck) 12/31/2014  . Lipoma of skin and subcutaneous tissue of neck 08/07/2014  . Reflux   . Depression   . Bursitis of left hip     Past Surgical History:  Procedure Laterality Date  . ANAL FISSURE REPAIR     . APPENDECTOMY    . [place]  . COLONOSCOPY WITH PROPOFOL N/A 12/18/2014   Procedure: COLONOSCOPY WITH PROPOFOL;  Surgeon: Christene Lye, MD;  Location: [facility] ENDOSCOPY;  Service: Endoscopy;  Laterality: N/A;  . LIPOMA EXCISION  07/2015   right shoulder  . LIPOMA EXCISION  11/19/2015  . OOPHORECTOMY     for "fibroids" not sure which ovary    Prior to Admission medications   Medication Sig Start Date End Date Taking? Authorizing Provider  aspirin EC 81 MG tablet Take 81 mg by mouth daily.    [provider]  Cholecalciferol (VITAMIN D3) 20 MCG TABS Take 25 mcg by mouth daily.    [provider]  Cyanocobalamin (B-12) 500 MCG TABS Take 2,500 mcg by mouth daily.     [provider]  Echinacea 125 MG CAPS Take 400 mg as needed by mouth.     [provider]  HYDROcodone-acetaminophen (NORCO/VICODIN) 5-325 MG tablet Take 1 tablet by mouth every 8 (eight) hours as needed for moderate pain. 12/03/17 12/03/18  Jearld Fenton, NP  meloxicam (MOBIC) 7.5 MG tablet Take 1 tablet (7.5 mg total) by mouth daily as needed for pain. 08/15/17   Arnetha Courser, MD  Multiple Vitamin (MULTIVITAMIN) tablet Take 1 tablet by mouth daily.    [provider]  Omega-3 Fatty Acids (FISH OIL PO) Take 1,200 mg  daily by mouth.     [provider]  Turmeric 500 MG CAPS Take 300 mg by mouth daily.     [provider]    Allergies Patient has no known allergies.  Family History  Problem Relation Age of Onset  . Hypertension Mother   . Kidney disease Mother   . Alzheimer's disease Mother   . Lung cancer Brother   . Rectal cancer Brother   . Breast cancer Neg Hx     Social History Social History   Tobacco Use  . Smoking status: Former Smoker    Packs/day: 0.50    Years: 15.00    Pack years: 7.50    Types: Cigarettes    Last attempt to quit: 10/24/2016    Years since quitting: 1.1  . Smokeless tobacco: Never Used   Substance Use Topics  . Alcohol use: Yes    Alcohol/week: 4.0 standard drinks    Types: 4 Standard drinks or equivalent per week    Comment: on occasion  . Drug use: No    Review of Systems  Constitutional: Negative for fever. Cardiovascular: Negative for chest pain. Respiratory: Negative for shortness of breath. Musculoskeletal: Positive for left foot pain, difficulty with gait.  Negative for left knee pain, left calf pain for left ankle pain. Skin: Negative for swelling or bruising. Neurological: Negative for tingling, focal weakness or numbness. ____________________________________________  PHYSICAL EXAM:  VITAL SIGNS: ED Triage Vitals  Enc Vitals Group     BP 12/03/17 1109 (!) 144/76     Pulse Rate 12/03/17 1107 66     Resp 12/03/17 1107 18     Temp 12/03/17 1107 (!) 97.5 F (36.4 C)     Temp src --      SpO2 12/03/17 1107 98 %     Weight 12/03/17 1106 215 lb 9.6 oz (97.8 kg)     Height 12/03/17 1106 5\' 5"  (1.651 m)     Head Circumference --      Peak Flow --      Pain Score 12/03/17 1105 8     Pain Loc --      Pain Edu? --      Excl. in [place]? --     Constitutional: Alert and oriented. Well appearing and in no distress. Cardiovascular: Normal rate, regular rhythm.  Pedal pulse 2+ bilaterally. Respiratory: Normal respiratory effort. No wheezes/rales/rhonchi. Musculoskeletal: Normal rotation of the left ankle.  No joint swelling noted.  Normal dorsiflexion and plantarflexion.  Pain with resistance to dorsiflexion.  Pain with palpation over the distal fourth metatarsal and along the lateral edge of the fifth metatarsal.  No swelling noted.  Gait not visualized. Neurologic: Sensation intact to BLE. Skin:  Skin is warm, dry and intact. No bruising noted. ____________________________________________   RADIOLOGY  Imaging Orders     DG Foot Complete Left IMPRESSION: 4th and 5th metatarsal shaft fractures, as  above. ____________________________________________  PROCEDURES  .Splint Application Date/Time: 12/45/8099 12:23 PM Performed by: Bayfield Callas, NT Authorized by: Jearld Fenton, NP   Consent:    Consent obtained:  Verbal   Consent given by:  Patient and spouse   Risks discussed:  Pain   Alternatives discussed:  No treatment and delayed treatment Pre-procedure details:    Sensation:  Normal Procedure details:    Laterality:  Left   Location:  Foot   Foot:  L foot   Strapping: no     Splint type:  Short leg   Supplies:  Elastic bandage Post-procedure details:    Pain:  Improved   Sensation:  Normal   Patient tolerance of procedure:  Tolerated well, no immediate complications  ____________________________________________  INITIAL IMPRESSION / ASSESSMENT AND PLAN / ED COURSE Left Foot Pain s/p Fall, 4th/5th Metatarsal Fractures:   Xray reviewed with patient Will place in posterior short leg splint Crutches provided RX for Hydrocodone-Acetaminophen 5-325 mg TID prn Non weight bearing until eval by ortho Advised her to follow up with Dr. Dorina Hoyer PA-C  I reviewed the patient's prescription history over the last 12 months in the multi-state controlled substances database(s) that includes [place], [place], [place], [place], [place], [place], [place], [place], [place], [place], [place], [place], [place], and [place].  Results were notable for Hydrocodone-Acetaminophen 5-325 mg # 20, 03/18/2017. ____________________________________________  FINAL CLINICAL IMPRESSION(S) / ED DIAGNOSES  Final diagnoses:  Foot pain, left  Fall (on) (from) other stairs and steps, initial encounter  Closed nondisplaced fracture of fourth metatarsal bone of left foot, initial encounter  Closed nondisplaced fracture of fifth metatarsal bone of left foot, initial encounter   Webb Silversmith, NP    Jearld Fenton, NP 12/03/17 1225    Orbie Pyo, MD 12/03/17 [phone]

## 2017-12-05 ENCOUNTER — Encounter: Payer: Self-pay | Admitting: Podiatry

## 2017-12-05 ENCOUNTER — Ambulatory Visit (INDEPENDENT_AMBULATORY_CARE_PROVIDER_SITE_OTHER): Payer: Medicare Other | Admitting: Podiatry

## 2017-12-05 DIAGNOSIS — S92302A Fracture of unspecified metatarsal bone(s), left foot, initial encounter for closed fracture: Secondary | ICD-10-CM

## 2017-12-05 DIAGNOSIS — R600 Localized edema: Secondary | ICD-10-CM | POA: Diagnosis not present

## 2017-12-06 ENCOUNTER — Other Ambulatory Visit: Payer: Self-pay | Admitting: Family Medicine

## 2017-12-06 DIAGNOSIS — Z1231 Encounter for screening mammogram for malignant neoplasm of breast: Secondary | ICD-10-CM

## 2017-12-06 NOTE — Progress Notes (Signed)
Subjective:   Patient ID: Allison Phillips, female   DOB: 67 y.o.   MRN: [id number]   HPI Patient presents stating she fell several days ago and she broke her left foot it is been very sore and swollen and she is not able to bear any weight and also her heel is hurting her a lot and she is concerned about that.  Patient does not smoke currently and would like to be active but cannot due to pain   Review of Systems  All other systems reviewed and are negative.       Objective:  Physical Exam  Constitutional: She appears well-developed and well-nourished.  Cardiovascular: Intact distal pulses.  Pulmonary/Chest: Effort normal.  Musculoskeletal: Normal range of motion.  Neurological: She is alert.  Skin: Skin is warm.  Nursing note and vitals reviewed.   Neurovascular status was found to be intact negative Homans sign noted with quite a bit of edema in the left foot secondary to the injury with bruising on the lateral side and exquisite discomfort when I tried to press it.  Patient did have good digital perfusion with no other pathology     Assessment:  Appears to be fracture of the left fourth and fifth metatarsal skin firmed on the x-rays that she brought with possibility for heel injury     Plan:  H&P conditions reviewed and I did explain this will probably take 8 to 12 weeks to heal there is no guarantee that they will heal it ultimately fixation may be necessary.  I went ahead today applied Unna boot Ace wrap to try to reduce the swelling and dispensed air fracture walker to completely immobilize with continued nonweightbearing over the next couple weeks and reappoint to reevaluate.  Patient will be seen back to recheck earlier if symptoms do not start to improve in the near future  X-ray indicates that there is fractures of the fourth and fifth metatarsals left midshaft with no indications currently of heel fracture

## 2017-12-23 ENCOUNTER — Telehealth: Payer: Self-pay | Admitting: Gastroenterology

## 2017-12-23 NOTE — Telephone Encounter (Signed)
Pt left vm to r/s her procedure 12/30/2017 please call pt

## 2017-12-26 ENCOUNTER — Ambulatory Visit (INDEPENDENT_AMBULATORY_CARE_PROVIDER_SITE_OTHER): Payer: Medicare Other

## 2017-12-26 ENCOUNTER — Ambulatory Visit (INDEPENDENT_AMBULATORY_CARE_PROVIDER_SITE_OTHER): Payer: Medicare Other | Admitting: Podiatry

## 2017-12-26 ENCOUNTER — Encounter: Payer: Self-pay | Admitting: Podiatry

## 2017-12-26 DIAGNOSIS — S92302D Fracture of unspecified metatarsal bone(s), left foot, subsequent encounter for fracture with routine healing: Secondary | ICD-10-CM | POA: Diagnosis not present

## 2017-12-26 NOTE — Telephone Encounter (Signed)
Pt left vm to r/s her procedure for 12/13 she was not happy having to call a 3rd time without a call back

## 2017-12-26 NOTE — Telephone Encounter (Signed)
I called pt and she has a brace on her foot and wants to reschedule her procedure in about 4 weeks. Recall letter to be sent to remind pt to contact office to reschedule colonoscopy. Pt stated she had called Dr. Delight Ovens office when she did not hear back from our office. She states she called prior to the telephone encounter for 12/6 which was Friday. I also had one this morning where pt was upset because she did not receive a call. Dr. Delight Ovens office told pt that procedure had been canceled already of which it had not.  [facility] notified to cancel procedure for 12/30/17.

## 2017-12-28 NOTE — Progress Notes (Signed)
Subjective:   Patient ID: Allison Phillips, female   DOB: 67 y.o.   MRN: [id number]   HPI Patient presents stating she still having pain but it is improving in the boot has really helped her   ROS      Objective:  Physical Exam  Neurovascular status intact with diminishment of discomfort lateral side left foot with pain still present upon deep palpation     Assessment:  Inflammatory condition with fracture of the left fifth metatarsal which appears to be improving clinically     Plan:  H&P condition reviewed and recommended continued boot usage ice therapy and that this should most likely want to uneventful healing but still could require surgery.  Patient will be seen back to recheck in approximately 4 weeks or earlier if needed  X-rays indicate there is still is healing to go fifth metatarsal left but it appears to be gradually improving

## 2017-12-30 ENCOUNTER — Ambulatory Visit: Admit: 2017-12-30 | Payer: Medicare Other | Admitting: Gastroenterology

## 2017-12-30 SURGERY — COLONOSCOPY WITH PROPOFOL
Anesthesia: General

## 2018-01-03 ENCOUNTER — Ambulatory Visit: Payer: Medicare Other | Admitting: Gastroenterology

## 2018-01-06 ENCOUNTER — Encounter: Payer: Self-pay | Admitting: Family Medicine

## 2018-01-06 ENCOUNTER — Ambulatory Visit (INDEPENDENT_AMBULATORY_CARE_PROVIDER_SITE_OTHER): Payer: Medicare Other | Admitting: Family Medicine

## 2018-01-06 VITALS — BP 122/64 | HR 65 | Temp 98.3°F | Ht 65.0 in | Wt 220.5 lb

## 2018-01-06 DIAGNOSIS — R7303 Prediabetes: Secondary | ICD-10-CM

## 2018-01-06 DIAGNOSIS — I10 Essential (primary) hypertension: Secondary | ICD-10-CM | POA: Diagnosis not present

## 2018-01-06 DIAGNOSIS — N182 Chronic kidney disease, stage 2 (mild): Secondary | ICD-10-CM | POA: Diagnosis not present

## 2018-01-06 DIAGNOSIS — M8580 Other specified disorders of bone density and structure, unspecified site: Secondary | ICD-10-CM | POA: Diagnosis not present

## 2018-01-06 DIAGNOSIS — E669 Obesity, unspecified: Secondary | ICD-10-CM

## 2018-01-06 DIAGNOSIS — Z5181 Encounter for therapeutic drug level monitoring: Secondary | ICD-10-CM

## 2018-01-06 NOTE — Assessment & Plan Note (Signed)
Next DEXA in April 2021; weight bearing activity encourged; calcium intake; fall precuations

## 2018-01-06 NOTE — Progress Notes (Signed)
BP 122/64   Pulse 65   Temp 98.3 F (36.8 C) (Oral)   Ht 5\' 5"  (1.651 m)   Wt 220 lb 8 oz (100 kg)   LMP  (LMP Unknown)   SpO2 98%   BMI 36.69 kg/m    Subjective:    Patient ID: Allison Phillips, female    DOB: [birth year], 67 y.o.   MRN: [id number]  HPI: [name] is a 67 y.o. female  Chief Complaint  Patient presents with  . Follow-up    HPI Patient is here for f/u  She broke two bones in the left foot on Nov 16th; managed by ortho, Dr. Charolette Child in [facility] No pain now; wears boot for four more weeks now; was oopeining a window and footstool went out, truly mechanical fall  HTN; controlled; not checking BP away from the doctor; does use some salt; husband checks sodium content on labels  Prediabetes one year ago; last A1c was 5.5, one year ago, it was 5.7; she says [name] diagnosed her with type 2 diabetes a few years ago; she was never on medicine; patient would like to challenge the diagnosis of type 2 diabetes  CKD stage 2, no NSAIDs; room for improvement with water drinking; not drinking as much when it is not hot outside; has meloxicam on her med list; just as needed for the pain  Obesity; patient cannot be as active with recent foot fracture; will do better  Osteopenia; does get calcium in her food, cereal with milk; supplement; some weight bearing activity, but walking has been limited; DEXA April 2019  Colonoscopy in January; no blood in stool  Depression screen [facility] 2/9 01/06/2018 10/06/2017 08/31/2017 08/15/2017 07/07/2017  Decreased Interest 0 0 0 0 0  Down, Depressed, Hopeless 0 0 0 0 0  PHQ - 2 Score 0 0 0 0 0  Altered sleeping 0 0 - 0 -  Tired, decreased energy 0 0 - 1 -  Change in appetite 0 0 - 1 -  Feeling bad or failure about yourself  0 0 - 0 -  Trouble concentrating 0 0 - 0 -  Moving slowly or fidgety/restless 0 0 - 0 -  Suicidal thoughts 0 0 - 0 -  PHQ-9 Score 0 0 - 2 -  Difficult doing work/chores Not difficult at all Not difficult at  all - Not difficult at all -   Fall Risk  01/06/2018 11/14/2017 10/06/2017 08/31/2017 08/15/2017  Falls in the past year? 1 No No No No  Number falls in past yr: 0 - - - -  Injury with Fall? 1 - - - -    Relevant past medical, surgical, family and social history reviewed Past Medical History:  Diagnosis Date  . Bursitis of left hip   . Depression   . Diverticulitis   . Hypertension   . Joint pain   . Obesity   . Osteopenia 04/20/2017   April 2019; next DEXA April 2021  . Osteoporosis   . Prediabetes   . Reflux   . Tobacco use    Past Surgical History:  Procedure Laterality Date  . ANAL FISSURE REPAIR    . APPENDECTOMY    . Braddock  . COLONOSCOPY WITH PROPOFOL N/A 12/18/2014   Procedure: COLONOSCOPY WITH PROPOFOL;  Surgeon: Christene Lye, MD;  Location: [facility] ENDOSCOPY;  Service: Endoscopy;  Laterality: N/A;  . LIPOMA EXCISION  07/2015   right shoulder  . LIPOMA EXCISION  11/19/2015  . OOPHORECTOMY     for "fibroids" not sure which ovary   Family History  Problem Relation Age of Onset  . Hypertension Mother   . Kidney disease Mother   . Alzheimer's disease Mother   . Thyroid cancer Son   . Lung cancer Brother   . Rectal cancer Brother   . Breast cancer Neg Hx    Social History   Tobacco Use  . Smoking status: Former Smoker    Packs/day: 0.50    Years: 15.00    Pack years: 7.50    Types: Cigarettes    Last attempt to quit: 10/24/2016    Years since quitting: 1.2  . Smokeless tobacco: Never Used  Substance Use Topics  . Alcohol use: Yes    Alcohol/week: 4.0 standard drinks    Types: 4 Standard drinks or equivalent per week    Comment: on occasion  . Drug use: No     Office Visit from 01/06/2018 in [facility]  AUDIT-C Score  3      Interim medical history since last visit reviewed. Allergies and medications reviewed  Review of Systems Per HPI unless specifically indicated above     Objective:    BP 122/64    Pulse 65   Temp 98.3 F (36.8 C) (Oral)   Ht 5\' 5"  (1.651 m)   Wt 220 lb 8 oz (100 kg)   LMP  (LMP Unknown)   SpO2 98%   BMI 36.69 kg/m   Wt Readings from Last 3 Encounters:  01/06/18 220 lb 8 oz (100 kg)  12/03/17 215 lb 9.6 oz (97.8 kg)  12/01/17 215 lb 9.6 oz (97.8 kg)    Physical Exam Constitutional:      General: She is not in acute distress.    Appearance: She is well-developed. She is obese. She is not diaphoretic.  HENT:     Head: Normocephalic and atraumatic.  Eyes:     General: No scleral icterus. Neck:     Thyroid: No thyromegaly.  Cardiovascular:     Rate and Rhythm: Normal rate and regular rhythm.     Heart sounds: Normal heart sounds. No murmur.  Pulmonary:     Effort: Pulmonary effort is normal. No respiratory distress.     Breath sounds: Normal breath sounds. No wheezing.  Abdominal:     General: Bowel sounds are normal. There is no distension.     Palpations: Abdomen is soft.  Skin:    General: Skin is warm and dry.     Coloration: Skin is not pale.  Neurological:     Mental Status: She is alert.  Psychiatric:        Behavior: Behavior normal.        Thought Content: Thought content normal.        Judgment: Judgment normal.        Assessment & Plan:   Problem List Items Addressed This Visit      Cardiovascular and Mediastinum   Essential hypertension, benign (Chronic)    excllent control today; limit salt        Musculoskeletal and Integument   Osteopenia (Chronic)    Next DEXA in April 2021; weight bearing activity encourged; calcium intake; fall precuations        Genitourinary   CKD (chronic kidney disease) stage 2, GFR 60-89 ml/min (Chronic)    Avoid NSAIDs and try to pick up hydration        Other   Prediabetes -  Primary (Chronic)    Previous A1c of 5.7; no evidence that I can see of diabetes type 2; no medicines; check labs today and go from there      Relevant Orders   Hemoglobin A1C   Obesity (BMI 35.0-39.9 without  comorbidity)    Encouraged weight loss; see AVS       Other Visit Diagnoses    Medication monitoring encounter       Relevant Orders   COMPLETE METABOLIC PANEL WITH GFR       Follow up plan: Return in about 6 months (around 07/08/2018) for follow-up visit with Dr. Sanda Klein; Medicare Wellness visit when due.  An after-visit summary was printed and given to the patient at [facility].  Please see the patient instructions which may contain other information and recommendations beyond what is mentioned above in the assessment and plan.  No orders of the defined types were placed in this encounter.   Orders Placed This Encounter  Procedures  . Hemoglobin A1C  . COMPLETE METABOLIC PANEL WITH GFR

## 2018-01-06 NOTE — Assessment & Plan Note (Signed)
Encouraged weight loss; see AVS 

## 2018-01-06 NOTE — Assessment & Plan Note (Signed)
excllent control today; limit salt

## 2018-01-06 NOTE — Assessment & Plan Note (Signed)
Previous A1c of 5.7; no evidence that I can see of diabetes type 2; no medicines; check labs today and go from there

## 2018-01-06 NOTE — Patient Instructions (Addendum)
Try to follow the DASH guidelines (DASH stands for Dietary Approaches to Stop Hypertension). Try to limit the sodium in your diet to no more than 1,500mg  of sodium per day. Certainly try to not exceed 2,000 mg per day at the very most. Do not add salt when cooking or at the table.  Check the sodium amount on labels when shopping, and choose items lower in sodium when given a choice. Avoid or limit foods that already contain a lot of sodium. Eat a diet rich in fruits and vegetables and whole grains, and try to lose weight if overweight or obese  Check out the information at familydoctor.org entitled "Nutrition for Weight Loss: What You Need to Know about Fad Diets" Try to lose between 1-2 pounds per week by taking in fewer calories and burning off more calories You can succeed by limiting portions, limiting foods dense in calories and fat, becoming more active, and drinking 8 glasses of water a day (64 ounces) Don't skip meals, especially breakfast, as skipping meals may alter your metabolism Do not use over-the-counter weight loss pills or gimmicks that claim rapid weight loss A healthy BMI (or body mass index) is between 18.5 and 24.9 You can calculate your ideal BMI at the [place] website ClubMonetize.fr   Preventing Unhealthy Weight Gain, Adult Staying at a healthy weight is important to your overall health. When fat builds up in your body, you may become overweight or obese. Being overweight or obese increases your risk of developing certain health problems, such as heart disease, diabetes, sleeping problems, joint problems, and some types of cancer. Unhealthy weight gain is often the result of making unhealthy food choices or not getting enough exercise. You can make changes to your lifestyle to prevent obesity and stay as healthy as possible. What nutrition changes can be made?   Eat only as much as your body needs. To do this: ? Pay attention  to signs that you are hungry or full. Stop eating as soon as you feel full. ? If you feel hungry, try drinking water first before eating. Drink enough water so your urine is clear or pale yellow. ? Eat smaller portions. Pay attention to portion sizes when eating out. ? Look at serving sizes on food labels. Most foods contain more than one serving per container. ? Eat the recommended number of calories for your gender and activity level. For most active people, a daily total of 2,000 calories is appropriate. If you are trying to lose weight or are not very active, you may need to eat fewer calories. Talk with your health care provider or a diet and nutrition specialist (dietitian) about how many calories you need each day.  Choose healthy foods, such as: ? Fruits and vegetables. At each meal, try to fill at least half of your plate with fruits and vegetables. ? Whole grains, such as whole-wheat bread, brown rice, and quinoa. ? Lean meats, such as chicken or fish. ? Other healthy proteins, such as beans, eggs, or tofu. ? Healthy fats, such as nuts, seeds, fatty fish, and olive oil. ? Low-fat or fat-free dairy products.  Check food labels, and avoid food and drinks that: ? Are high in calories. ? Have added sugar. ? Are high in sodium. ? Have saturated fats or trans fats.  Cook foods in healthier ways, such as by baking, broiling, or grilling.  Make a meal plan for the week, and shop with a grocery list to help you stay on track with  your purchases. Try to avoid going to the grocery store when you are hungry.  When grocery shopping, try to shop around the outside of the store first, where the fresh foods are. Doing this helps you to avoid prepackaged foods, which can be high in sugar, salt (sodium), and fat. What lifestyle changes can be made?   Exercise for 30 or more minutes on 5 or more days each week. Exercising may include brisk walking, yard work, biking, running, swimming, and team  sports like basketball and soccer. Ask your health care provider which exercises are safe for you.  Do muscle-strengthening activities, such as lifting weights or using resistance bands, on 2 or more days a week.  Do not use any products that contain nicotine or tobacco, such as cigarettes and e-cigarettes. If you need help quitting, ask your health care provider.  Limit alcohol intake to no more than 1 drink a day for nonpregnant women and 2 drinks a day for men. One drink equals 12 oz of beer, 5 oz of wine, or 1 oz of hard liquor.  Try to get 7-9 hours of sleep each night. What other changes can be made?  Keep a food and activity journal to keep track of: ? What you ate and how many calories you had. Remember to count the calories in sauces, dressings, and side dishes. ? Whether you were active, and what exercises you did. ? Your calorie, weight, and activity goals.  Check your weight regularly. Track any changes. If you notice you have gained weight, make changes to your diet or activity routine.  Avoid taking weight-loss medicines or supplements. Talk to your health care provider before starting any new medicine or supplement.  Talk to your health care provider before trying any new diet or exercise plan. Why are these changes important? Eating healthy, staying active, and having healthy habits can help you to prevent obesity. Those changes also:  Help you manage stress and emotions.  Help you connect with friends and family.  Improve your self-esteem.  Improve your sleep.  Prevent long-term health problems. What can happen if changes are not made? Being obese or overweight can cause you to develop joint or bone problems, which can make it hard for you to stay active or do activities you enjoy. Being obese or overweight also puts stress on your heart and lungs and can lead to health problems like diabetes, heart disease, and some cancers. Where to find more information Talk  with your health care provider or a dietitian about healthy eating and healthy lifestyle choices. You may also find information from:  U.S. Department of Agriculture, MyPlate: FormerBoss.no  American Heart Association: www.heart.org  Centers for Disease Control and Prevention: http://www.wolf.info/ Summary  Staying at a healthy weight is important to your overall health. It helps you to prevent certain diseases and health problems, such as heart disease, diabetes, joint problems, sleep disorders, and some types of cancer.  Being obese or overweight can cause you to develop joint or bone problems, which can make it hard for you to stay active or do activities you enjoy.  You can prevent unhealthy weight gain by eating a healthy diet, exercising regularly, not smoking, limiting alcohol, and getting enough sleep.  Talk with your health care provider or a dietitian for guidance about healthy eating and healthy lifestyle choices. This information is not intended to replace advice given to you by your health care provider. Make sure you discuss any questions you have with your  health care provider. Document Released: 01/06/2016 Document Revised: 10/15/2016 Document Reviewed: 02/11/2016 Elsevier Interactive Patient Education  2019 [employer].

## 2018-01-06 NOTE — Assessment & Plan Note (Signed)
Avoid NSAIDs and try to pick up hydration

## 2018-01-07 LAB — HEMOGLOBIN A1C
EAG (MMOL/L): 6.2 (calc)
Hgb A1c MFr Bld: 5.5 % of total Hgb (ref <5.7)
Mean Plasma Glucose: 111 (calc)

## 2018-01-07 LAB — COMPLETE METABOLIC PANEL WITH GFR
AG RATIO: 1.6 (calc) (ref 1.0–2.5)
ALT: 12 U/L (ref 6–29)
AST: 14 U/L (ref 10–35)
Albumin: 4.2 g/dL (ref 3.6–5.1)
Alkaline phosphatase (APISO): 56 U/L (ref 33–130)
BUN: 19 mg/dL (ref 7–25)
CALCIUM: 9.9 mg/dL (ref 8.6–10.4)
CO2: 31 mmol/L (ref 20–32)
Chloride: 103 mmol/L (ref 98–110)
Creat: 0.83 mg/dL (ref 0.50–0.99)
GFR, Est African American: 85 mL/min/{1.73_m2} (ref >=60)
GFR, Est Non African American: 73 mL/min/{1.73_m2} (ref >=60)
Globulin: 2.7 g/dL (calc) (ref 1.9–3.7)
Glucose, Bld: 87 mg/dL (ref 65–99)
POTASSIUM: 5 mmol/L (ref 3.5–5.3)
Sodium: 139 mmol/L (ref 135–146)
TOTAL PROTEIN: 6.9 g/dL (ref 6.1–8.1)
Total Bilirubin: 0.4 mg/dL (ref 0.2–1.2)

## 2018-01-17 ENCOUNTER — Telehealth: Payer: Self-pay | Admitting: Gastroenterology

## 2018-01-17 NOTE — Telephone Encounter (Signed)
Pt left vm to reschedule a colonoscopy

## 2018-01-19 NOTE — Telephone Encounter (Signed)
Pt is calling to schedule a colonoscopy please call her cell [phone]

## 2018-01-20 NOTE — Telephone Encounter (Signed)
Left message that call was returned and she could contact office or I will try again next week.

## 2018-01-23 NOTE — Telephone Encounter (Signed)
Pt left vm to reschedule her procedure

## 2018-01-24 ENCOUNTER — Other Ambulatory Visit: Payer: Self-pay

## 2018-01-24 DIAGNOSIS — Z800 Family history of malignant neoplasm of digestive organs: Secondary | ICD-10-CM

## 2018-02-06 ENCOUNTER — Ambulatory Visit: Payer: Medicare Other | Admitting: Podiatry

## 2018-02-06 ENCOUNTER — Ambulatory Visit
Admission: RE | Admit: 2018-02-06 | Discharge: 2018-02-06 | Disposition: A | Discharge status: Home / Self Care | Payer: Medicare Other | Source: Ambulatory Visit | Attending: Family Medicine | Admitting: Family Medicine

## 2018-02-06 ENCOUNTER — Encounter (INDEPENDENT_AMBULATORY_CARE_PROVIDER_SITE_OTHER): Payer: Self-pay

## 2018-02-06 DIAGNOSIS — Z1231 Encounter for screening mammogram for malignant neoplasm of breast: Secondary | ICD-10-CM

## 2018-02-08 ENCOUNTER — Ambulatory Visit (INDEPENDENT_AMBULATORY_CARE_PROVIDER_SITE_OTHER): Payer: Medicare Other | Admitting: Podiatry

## 2018-02-08 ENCOUNTER — Ambulatory Visit (INDEPENDENT_AMBULATORY_CARE_PROVIDER_SITE_OTHER): Payer: Medicare Other

## 2018-02-08 ENCOUNTER — Encounter: Payer: Self-pay | Admitting: Podiatry

## 2018-02-08 DIAGNOSIS — L309 Dermatitis, unspecified: Secondary | ICD-10-CM | POA: Diagnosis not present

## 2018-02-08 DIAGNOSIS — R600 Localized edema: Secondary | ICD-10-CM | POA: Diagnosis not present

## 2018-02-08 DIAGNOSIS — S92302D Fracture of unspecified metatarsal bone(s), left foot, subsequent encounter for fracture with routine healing: Secondary | ICD-10-CM

## 2018-02-08 NOTE — Progress Notes (Signed)
Subjective:   Patient ID: Allison Phillips, female   DOB: 68 y.o.   MRN: [id number]   HPI Patient states my left foot is doing pretty well with pain that is reduced and I am walking with a better gait and I have some dryness on my left heel   ROS      Objective:  Physical Exam  Neurovascular status intact with significant diminishment of discomfort lateral side left foot with metatarsal that healing well and stable.  Patient has slight irritation of the left heel localized     Assessment:  Well-healing severe fracture left fifth metatarsal with dermatitis left     Plan:  Final x-ray reviewed and return to normal activity and use medication for dry skin left which I instructed her to do today.  Patient will be seen back as indicated  X-ray indicates the fracture is healing well left fifth metatarsal and is progressing as expected

## 2018-02-16 ENCOUNTER — Encounter: Payer: Self-pay | Admitting: Anesthesiology

## 2018-02-17 ENCOUNTER — Ambulatory Visit
Admission: RE | Admit: 2018-02-17 | Discharge: 2018-02-17 | Disposition: A | Discharge status: Home / Self Care | Payer: Medicare Other | Attending: Gastroenterology | Admitting: Gastroenterology

## 2018-02-17 ENCOUNTER — Encounter
Admission: RE | Disposition: A | Discharge status: Home / Self Care | Payer: Self-pay | Source: Home / Self Care | Attending: Gastroenterology

## 2018-02-17 ENCOUNTER — Ambulatory Visit: Payer: Medicare Other | Admitting: Anesthesiology

## 2018-02-17 DIAGNOSIS — E669 Obesity, unspecified: Secondary | ICD-10-CM | POA: Diagnosis not present

## 2018-02-17 DIAGNOSIS — K5730 Diverticulosis of large intestine without perforation or abscess without bleeding: Secondary | ICD-10-CM | POA: Diagnosis not present

## 2018-02-17 DIAGNOSIS — Z791 Long term (current) use of non-steroidal anti-inflammatories (NSAID): Secondary | ICD-10-CM | POA: Insufficient documentation

## 2018-02-17 DIAGNOSIS — M8580 Other specified disorders of bone density and structure, unspecified site: Secondary | ICD-10-CM | POA: Insufficient documentation

## 2018-02-17 DIAGNOSIS — D125 Benign neoplasm of sigmoid colon: Secondary | ICD-10-CM | POA: Diagnosis not present

## 2018-02-17 DIAGNOSIS — Z841 Family history of disorders of kidney and ureter: Secondary | ICD-10-CM | POA: Insufficient documentation

## 2018-02-17 DIAGNOSIS — K621 Rectal polyp: Secondary | ICD-10-CM | POA: Diagnosis not present

## 2018-02-17 DIAGNOSIS — K6289 Other specified diseases of anus and rectum: Secondary | ICD-10-CM

## 2018-02-17 DIAGNOSIS — Z87891 Personal history of nicotine dependence: Secondary | ICD-10-CM | POA: Diagnosis not present

## 2018-02-17 DIAGNOSIS — Z820 Family history of epilepsy and other diseases of the nervous system: Secondary | ICD-10-CM | POA: Diagnosis not present

## 2018-02-17 DIAGNOSIS — Z801 Family history of malignant neoplasm of trachea, bronchus and lung: Secondary | ICD-10-CM | POA: Diagnosis not present

## 2018-02-17 DIAGNOSIS — K635 Polyp of colon: Secondary | ICD-10-CM | POA: Diagnosis not present

## 2018-02-17 DIAGNOSIS — F329 Major depressive disorder, single episode, unspecified: Secondary | ICD-10-CM | POA: Diagnosis not present

## 2018-02-17 DIAGNOSIS — K219 Gastro-esophageal reflux disease without esophagitis: Secondary | ICD-10-CM | POA: Insufficient documentation

## 2018-02-17 DIAGNOSIS — M810 Age-related osteoporosis without current pathological fracture: Secondary | ICD-10-CM | POA: Insufficient documentation

## 2018-02-17 DIAGNOSIS — D123 Benign neoplasm of transverse colon: Secondary | ICD-10-CM | POA: Diagnosis not present

## 2018-02-17 DIAGNOSIS — Z7982 Long term (current) use of aspirin: Secondary | ICD-10-CM | POA: Diagnosis not present

## 2018-02-17 DIAGNOSIS — K5732 Diverticulitis of large intestine without perforation or abscess without bleeding: Secondary | ICD-10-CM

## 2018-02-17 DIAGNOSIS — Z79899 Other long term (current) drug therapy: Secondary | ICD-10-CM | POA: Insufficient documentation

## 2018-02-17 DIAGNOSIS — Z8249 Family history of ischemic heart disease and other diseases of the circulatory system: Secondary | ICD-10-CM | POA: Insufficient documentation

## 2018-02-17 DIAGNOSIS — Z808 Family history of malignant neoplasm of other organs or systems: Secondary | ICD-10-CM | POA: Insufficient documentation

## 2018-02-17 DIAGNOSIS — Z86010 Personal history of colonic polyps: Secondary | ICD-10-CM | POA: Diagnosis not present

## 2018-02-17 DIAGNOSIS — Z1211 Encounter for screening for malignant neoplasm of colon: Secondary | ICD-10-CM | POA: Diagnosis not present

## 2018-02-17 DIAGNOSIS — K5790 Diverticulosis of intestine, part unspecified, without perforation or abscess without bleeding: Secondary | ICD-10-CM | POA: Diagnosis not present

## 2018-02-17 DIAGNOSIS — N182 Chronic kidney disease, stage 2 (mild): Secondary | ICD-10-CM | POA: Diagnosis not present

## 2018-02-17 DIAGNOSIS — Z800 Family history of malignant neoplasm of digestive organs: Secondary | ICD-10-CM

## 2018-02-17 DIAGNOSIS — I10 Essential (primary) hypertension: Secondary | ICD-10-CM | POA: Diagnosis not present

## 2018-02-17 DIAGNOSIS — I129 Hypertensive chronic kidney disease with stage 1 through stage 4 chronic kidney disease, or unspecified chronic kidney disease: Secondary | ICD-10-CM | POA: Diagnosis not present

## 2018-02-17 DIAGNOSIS — R7303 Prediabetes: Secondary | ICD-10-CM | POA: Diagnosis not present

## 2018-02-17 DIAGNOSIS — Z6834 Body mass index (BMI) 34.0-34.9, adult: Secondary | ICD-10-CM | POA: Insufficient documentation

## 2018-02-17 HISTORY — PX: COLONOSCOPY WITH PROPOFOL: SHX5780

## 2018-02-17 SURGERY — COLONOSCOPY WITH PROPOFOL
Anesthesia: General

## 2018-02-17 MED ORDER — SODIUM CHLORIDE 0.9 % IV SOLN
INTRAVENOUS | Status: DC
  Administered 2018-02-17: 1000 mL via INTRAVENOUS

## 2018-02-17 MED ORDER — PROPOFOL 10 MG/ML IV BOLUS
INTRAVENOUS | Status: DC | PRN
  Administered 2018-02-17: 120 mg via INTRAVENOUS

## 2018-02-17 MED ORDER — LIDOCAINE HCL (CARDIAC) PF 100 MG/5ML IV SOSY
PREFILLED_SYRINGE | INTRAVENOUS | Status: DC | PRN
  Administered 2018-02-17: 50 mg via INTRATRACHEAL

## 2018-02-17 MED ORDER — PROPOFOL 500 MG/50ML IV EMUL
INTRAVENOUS | Status: DC | PRN
  Administered 2018-02-17: 120 ug/kg/min via INTRAVENOUS

## 2018-02-17 NOTE — Anesthesia Postprocedure Evaluation (Signed)
Anesthesia Post Note  Patient: Allison Phillips  Procedure(s) Performed: COLONOSCOPY WITH PROPOFOL (N/A )  Patient location during evaluation: Endoscopy Anesthesia Type: General Level of consciousness: awake and alert Pain management: pain level controlled Vital Signs Assessment: post-procedure vital signs reviewed and stable Respiratory status: spontaneous breathing, nonlabored ventilation, respiratory function stable and patient connected to nasal cannula oxygen Cardiovascular status: blood pressure returned to baseline and stable Postop Assessment: no apparent nausea or vomiting Anesthetic complications: no     Last Vitals:  Vitals:   02/17/18 1128 02/17/18 1138  BP: (!) 83/64 124/77  Pulse: 73 86  Resp: 20 13  Temp:    SpO2: 100% 100%    Last Pain:  Vitals:   02/17/18 1138  TempSrc:   PainSc: 0-No pain                 Shauna Bodkins S

## 2018-02-17 NOTE — Op Note (Addendum)
[facility] Gastroenterology Patient Name: Allison Phillips Procedure Date: 02/17/2018 10:32 AM MRN: [id number] Account #: [id number] Date of Birth: 1950-03-07 Admit Type: Outpatient Age: 68 Room: [facility] ENDO ROOM 2 Gender: Female Note Status: Finalized Procedure:            Colonoscopy Indications:          Incidental - Follow-up of diverticulitis Providers:            Amyri Frenz B. Bonna Gains MD, MD Referring MD:         Arnetha Courser (Referring MD) Medicines:            Monitored Anesthesia Care Complications:        No immediate complications. Procedure:            Pre-Anesthesia Assessment:                       - ASA Grade Assessment: II - A patient with mild                        systemic disease.                       - Prior to the procedure, a History and Physical was                        performed, and patient medications, allergies and                        sensitivities were reviewed. The patient's tolerance of                        previous anesthesia was reviewed.                       - The risks and benefits of the procedure and the                        sedation options and risks were discussed with the                        patient. All questions were answered and informed                        consent was obtained.                       - Patient identification and proposed procedure were                        verified prior to the procedure by the physician, the                        nurse, the anesthesiologist, the anesthetist and the                        technician. The procedure was verified in the procedure                        room.  After obtaining informed consent, the colonoscope was                        passed under direct vision. Throughout the procedure,                        the patient's blood pressure, pulse, and oxygen                        saturations were monitored continuously. The                Colonoscope was introduced through the anus and                        advanced to the the cecum, identified by appendiceal                        orifice and ileocecal valve. The colonoscopy was                        performed with ease. The patient tolerated the                        procedure well. The quality of the bowel preparation                        was good. Findings:      The perianal and digital rectal examinations were normal.      Three sessile polyps were found in the sigmoid colon and transverse       colon. The polyps were 2 to 4 mm in size. These polyps were removed with       a cold biopsy forceps. Resection and retrieval were complete.      Multiple diverticula were found in the sigmoid colon.      The exam was otherwise without abnormality.      The rectum, sigmoid colon, descending colon, transverse colon, ascending       colon and cecum appeared normal.      A tattoo was seen in the sigmoid colon. The tattoo site appeared normal.      One 7 mm nodule was found in the rectum. Biopsies were taken with a cold       forceps for histology. This likely represents mucosal prolapse.      No additional abnormalities were found on retroflexion. Impression:           - Three 2 to 4 mm polyps in the sigmoid colon and in                        the transverse colon, removed with a cold biopsy                        forceps. Resected and retrieved.                       - Diverticulosis in the sigmoid colon.                       - The examination was otherwise normal.                       -  The rectum, sigmoid colon, descending colon,                        transverse colon, ascending colon and cecum are normal.                       - A tattoo was seen in the sigmoid colon. The tattoo                        site appeared normal.                       - Nodule in the rectum. Biopsied. Likely represents                        mucosal prolapse Recommendation:        - Discharge patient to home (with escort).                       - High fiber diet.                       - Advance diet as tolerated.                       - Continue present medications.                       - Await pathology results.                       - Repeat colonoscopy date to be determined after                        pending pathology results are reviewed.                       - The findings and recommendations were discussed with                        the patient.                       - The findings and recommendations were discussed with                        the patient's family.                       - Return to primary care physician as previously                        scheduled. Procedure Code(s):    --- Professional ---                       [phone], Colonoscopy, flexible; with biopsy, single or                        multiple Diagnosis Code(s):    --- Professional ---                       D12.5, Benign neoplasm of sigmoid colon  D12.3, Benign neoplasm of transverse colon (hepatic                        flexure or splenic flexure)                       K62.89, Other specified diseases of anus and rectum                       K57.30, Diverticulosis of large intestine without                        perforation or abscess without bleeding CPT copyright 2018 American Medical Association. All rights reserved. The codes documented in this report are preliminary and upon coder review may  be revised to meet current compliance requirements.  Vonda Antigua, MD Margretta Sidle B. Bonna Gains MD, MD 02/17/2018 11:18:13 AM This report has been signed electronically. Number of Addenda: 0 Note Initiated On: 02/17/2018 10:32 AM Scope Withdrawal Time: 0 hours 17 minutes 57 seconds  Total Procedure Duration: 0 hours 30 minutes 40 seconds  Estimated Blood Loss: Estimated blood loss: none.      [facility]

## 2018-02-17 NOTE — Anesthesia Post-op Follow-up Note (Signed)
Anesthesia QCDR form completed.        

## 2018-02-17 NOTE — Transfer of Care (Signed)
Immediate Anesthesia Transfer of Care Note  Patient: Allison Phillips  Procedure(s) Performed: COLONOSCOPY WITH PROPOFOL (N/A )  Patient Location: PACU and Endoscopy Unit  Anesthesia Type:General  Level of Consciousness: awake  Airway & Oxygen Therapy: Patient Spontanous Breathing  Post-op Assessment: Report given to RN and Post -op Vital signs reviewed and stable  Post vital signs: Reviewed and stable  Last Vitals:  Vitals Value Taken Time  BP 140/81 02/17/2018 11:19 AM  Temp 36.2 C 02/17/2018 11:18 AM  Pulse 74 02/17/2018 11:23 AM  Resp 19 02/17/2018 11:23 AM  SpO2 97 % 02/17/2018 11:23 AM  Vitals shown include unvalidated device data.  Last Pain:  Vitals:   02/17/18 1118  TempSrc: Tympanic  PainSc: 0-No pain         Complications: No apparent anesthesia complications

## 2018-02-17 NOTE — Anesthesia Preprocedure Evaluation (Signed)
Anesthesia Evaluation  Patient identified by MRN, date of birth, ID band Patient awake    Reviewed: Allergy & Precautions, NPO status , Patient's Chart, lab work & pertinent test results, reviewed documented beta blocker date and time   Airway Mallampati: III  TM Distance: >3 FB     Dental  (+) Chipped   Pulmonary former smoker,           Cardiovascular hypertension, Pt. on medications      Neuro/Psych PSYCHIATRIC DISORDERS Depression    GI/Hepatic   Endo/Other    Renal/GU Renal disease     Musculoskeletal   Abdominal   Peds  Hematology   Anesthesia Other Findings Smokes. Obese.  Reproductive/Obstetrics                             Anesthesia Physical Anesthesia Plan  ASA: III  Anesthesia Plan: General   Post-op Pain Management:    Induction: Intravenous  PONV Risk Score and Plan:   Airway Management Planned:   Additional Equipment:   Intra-op Plan:   Post-operative Plan:   Informed Consent: I have reviewed the patients History and Physical, chart, labs and discussed the procedure including the risks, benefits and alternatives for the proposed anesthesia with the patient or authorized representative who has indicated his/her understanding and acceptance.       Plan Discussed with: CRNA  Anesthesia Plan Comments:         Anesthesia Quick Evaluation

## 2018-02-17 NOTE — H&P (Signed)
Vonda Antigua, MD [address] [place], [place], [place], [address] [address], [place], [place], [place], [address] Phone: [phone]  Fax: [phone]  Primary Care Physician:  Arnetha Courser, MD   Pre-Procedure History & Physical: HPI:  Allison Phillips is a 68 y.o. female is here for a colonoscopy.   Past Medical History:  Diagnosis Date  . Bursitis of left hip   . Depression   . Diverticulitis   . Hypertension   . Joint pain   . Obesity   . Osteopenia 04/20/2017   April 2019; next DEXA April 2021  . Osteoporosis   . Prediabetes   . Reflux   . Tobacco use     Past Surgical History:  Procedure Laterality Date  . ANAL FISSURE REPAIR    . APPENDECTOMY    . [place]  . COLONOSCOPY WITH PROPOFOL N/A 12/18/2014   Procedure: COLONOSCOPY WITH PROPOFOL;  Surgeon: Christene Lye, MD;  Location: [facility] ENDOSCOPY;  Service: Endoscopy;  Laterality: N/A;  . LIPOMA EXCISION  07/2015   right shoulder  . LIPOMA EXCISION  11/19/2015  . OOPHORECTOMY     for "fibroids" not sure which ovary    Prior to Admission medications   Medication Sig Start Date End Date Taking? Authorizing Provider  aspirin EC 81 MG tablet Take 81 mg by mouth daily.   Yes [provider]  Cholecalciferol (VITAMIN D3) 20 MCG TABS Take 25 mcg by mouth daily.   Yes [provider]  Cyanocobalamin (B-12) 500 MCG TABS Take 2,500 mcg by mouth daily.    Yes [provider]  Echinacea 125 MG CAPS Take 400 mg as needed by mouth.    Yes [provider]  meloxicam (MOBIC) 7.5 MG tablet Take 1 tablet (7.5 mg total) by mouth daily as needed for pain. 08/15/17  Yes Lada, Satira Anis, MD  Multiple Vitamin (MULTIVITAMIN) tablet Take 1 tablet by mouth daily.   Yes [provider]  Omega-3 Fatty Acids (FISH OIL PO) Take 1,200 mg daily by mouth.    Yes [provider]  Turmeric 500 MG CAPS Take 300 mg by mouth daily.    Yes [provider]    Allergies as of 01/24/2018  . (No Known Allergies)    Family History  Problem Relation Age of Onset  . Hypertension Mother   . Kidney disease Mother   . Alzheimer's disease Mother   . Thyroid cancer Son   . Lung cancer Brother   . Rectal cancer Brother   . Breast cancer Neg Hx     Social History   Socioeconomic History  . Marital status: Married    Spouse name: Not on file  . Number of children: Not on file  . Years of education: Not on file  . Highest education level: Not on file  Occupational History  . Not on file  Social Needs  . Financial resource strain: Not on file  . Food insecurity:    Worry: Not on file    Inability: Not on file  . Transportation needs:    Medical: Not on file    Non-medical: Not on file  Tobacco Use  . Smoking status: Former Smoker    Packs/day: 0.50    Years: 15.00    Pack years: 7.50    Types: Cigarettes    Last attempt to quit: 10/24/2016    Years since quitting: 1.3  . Smokeless tobacco: Never Used  Substance  and Sexual Activity  . Alcohol use: Yes    Alcohol/week: 4.0 standard drinks    Types: 4 Standard drinks or equivalent per week    Comment: on occasion  . Drug use: No  . Sexual activity: Yes  Lifestyle  . Physical activity:    Days per week: Not on file    Minutes per session: Not on file  . Stress: Not on file  Relationships  . Social connections:    Talks on phone: Not on file    Gets together: Not on file    Attends religious service: Not on file    Active member of club or organization: Not on file    Attends meetings of clubs or organizations: Not on file    Relationship status: Not on file  . Intimate partner violence:    Fear of current or ex partner: Not on file    Emotionally abused: Not on file    Physically abused: Not on file    Forced sexual activity: Not on file  Other Topics Concern  . Not on file  Social History Narrative  . Not on file    Review of Systems: See HPI, otherwise  negative ROS  Physical Exam: BP 125/80   Pulse 60   Temp (!) 97.5 F (36.4 C) (Tympanic)   Resp 20   Ht 5\' 7"  (1.702 m)   Wt 98.9 kg   LMP  (LMP Unknown)   SpO2 100%   BMI 34.14 kg/m  General:   Alert,  pleasant and cooperative in NAD Head:  Normocephalic and atraumatic. Neck:  Supple; no masses or thyromegaly. Lungs:  Clear throughout to auscultation, normal respiratory effort.    Heart:  +S1, +S2, Regular rate and rhythm, No edema. Abdomen:  Soft, nontender and nondistended. Normal bowel sounds, without guarding, and without rebound.   Neurologic:  Alert and  oriented x4;  grossly normal neurologically.  Impression/Plan: Allison Phillips is here for a colonoscopy to be performed for diverticulitis  Risks, benefits, limitations, and alternatives regarding  colonoscopy have been reviewed with the patient.  Questions have been answered.  All parties agreeable.   Virgel Manifold, MD  02/17/2018, 9:54 AM

## 2018-02-20 ENCOUNTER — Encounter: Payer: Self-pay | Admitting: Gastroenterology

## 2018-02-21 LAB — SURGICAL PATHOLOGY

## 2018-03-07 ENCOUNTER — Telehealth: Payer: Self-pay | Admitting: Gastroenterology

## 2018-03-07 ENCOUNTER — Encounter: Payer: Self-pay | Admitting: Gastroenterology

## 2018-03-07 NOTE — Telephone Encounter (Signed)
Pt left vm she had a procedure with Dr. Darene Lamer and had a Biopsy and is calling for results

## 2018-03-07 NOTE — Telephone Encounter (Signed)
Pt is calling  For biopsy result she asked if it was normal if results take that long

## 2018-03-08 NOTE — Telephone Encounter (Signed)
Pt notified of results. Letter sent. Recall letter to repeat colonoscopy in 5 yrs.

## 2018-03-24 ENCOUNTER — Telehealth: Payer: Self-pay | Admitting: Gastroenterology

## 2018-03-24 NOTE — Telephone Encounter (Signed)
Patient called because she received a recall letter for f/u in 6 months. But she has had a recent colonoscopy. Does she still need this appointment.

## 2018-03-29 NOTE — Telephone Encounter (Signed)
Explained to pt that Dr. Bonna Gains would like for her to keep her f/u appt.  Please contact pt and for her 6 month f/u appt. She is aware you will be calling. Thank you.

## 2018-04-06 ENCOUNTER — Ambulatory Visit: Payer: Medicare Other | Admitting: Gastroenterology

## 2018-06-05 ENCOUNTER — Encounter: Payer: Self-pay | Admitting: Nurse Practitioner

## 2018-06-05 ENCOUNTER — Ambulatory Visit (INDEPENDENT_AMBULATORY_CARE_PROVIDER_SITE_OTHER): Payer: Medicare Other | Admitting: Nurse Practitioner

## 2018-06-05 ENCOUNTER — Other Ambulatory Visit: Payer: Self-pay

## 2018-06-05 VITALS — BP 125/79 | HR 65 | Temp 96.5°F | Ht 65.0 in | Wt 218.0 lb

## 2018-06-05 DIAGNOSIS — I10 Essential (primary) hypertension: Secondary | ICD-10-CM

## 2018-06-05 DIAGNOSIS — M25552 Pain in left hip: Secondary | ICD-10-CM | POA: Diagnosis not present

## 2018-06-05 DIAGNOSIS — M8580 Other specified disorders of bone density and structure, unspecified site: Secondary | ICD-10-CM | POA: Diagnosis not present

## 2018-06-05 MED ORDER — MELOXICAM 7.5 MG PO TABS: 7.5000 mg | ORAL_TABLET | Freq: Every day | ORAL | 1 refills | Status: DC | PRN

## 2018-06-05 NOTE — Progress Notes (Signed)
Virtual Visit via Video Note  I connected with Evalyn Casco  on 06/05/18 at  2:40 PM EDT by a video enabled telemedicine application and verified that I am speaking with the correct person using two identifiers.   Staff discussed the limitations of evaluation and management by telemedicine and the availability of in person appointments. The patient expressed understanding and agreed to proceed.  Patient location: home  My location: work office Other people present: none HPI  Patient endorses left hip pain- states has bursitis and has had cortisone shots in the past. States left hip pain shoots down outer thigh to knee. States last week had been working outside. States left hip pain started last Friday, states has been getting progressively better but not resolved. Wanted to know if she can try meloxicam.  Endorses pins-and-needles sensation, denies numbness or tingling, bowel or bladder incontinence. Denies nausea, vomiting, dysuria, urinary frequency  Hypertension States has been borderline for a while, but has been working on healthy eating. States doesn't add a lot of salt but doesn't do a low-salt diet, lots of vegetables.  Initial blood pressure was when she had been moving around and went to go find blood pressure cuff, much improved when she sat down and rested.  Denies chest pain, headaches, blurry vision, lightheadedness. BP Readings from Last 3 Encounters:  06/05/18 125/79  02/17/18 124/77  01/06/18 122/64   Osteopenia Takes 1000IU vitamin D and a multivitamin.  She walks around about 60 minutes 3 days a week.   PHQ2/9: Depression screen [facility] 2/9 06/05/2018 01/06/2018 10/06/2017 08/31/2017 08/15/2017  Decreased Interest 1 0 0 0 0  Down, Depressed, Hopeless 1 0 0 0 0  PHQ - 2 Score 2 0 0 0 0  Altered sleeping 1 0 0 - 0  Tired, decreased energy 0 0 0 - 1  Change in appetite 0 0 0 - 1  Feeling bad or failure about yourself  0 0 0 - 0  Trouble concentrating 0 0 0 - 0  Moving  slowly or fidgety/restless 0 0 0 - 0  Suicidal thoughts 0 0 0 - 0  PHQ-9 Score 3 0 0 - 2  Difficult doing work/chores - Not difficult at all Not difficult at all - Not difficult at all    PHQ reviewed. Negative- situational   Patient Active Problem List   Diagnosis Date Noted  . Prediabetes 01/06/2018  . Obesity (BMI 35.0-39.9 without comorbidity) 01/06/2018  . CKD (chronic kidney disease) stage 2, GFR 60-89 ml/min 07/07/2017  . Osteopenia 04/20/2017  . Hip pain, chronic, left 03/18/2017  . Lower back pain 03/18/2017  . Sacroiliac joint pain 03/18/2017  . Screening for osteoporosis 12/30/2016  . Preventative health care 12/07/2016  . Encounter for hepatitis C screening test for low risk patient 12/06/2016  . Essential hypertension, benign 03/08/2016  . Cervical pain (neck) 12/31/2014  . Lipoma of skin and subcutaneous tissue of neck 08/07/2014  . Reflux   . Depression   . Bursitis of left hip     Past Medical History:  Diagnosis Date  . Bursitis of left hip   . Depression   . Diverticulitis   . Hypertension   . Joint pain   . Obesity   . Osteopenia 04/20/2017   April 2019; next DEXA April 2021  . Osteoporosis   . Prediabetes   . Reflux   . Tobacco use     Past Surgical History:  Procedure Laterality Date  . ANAL FISSURE REPAIR    .  APPENDECTOMY    . [place]  . COLONOSCOPY WITH PROPOFOL N/A 12/18/2014   Procedure: COLONOSCOPY WITH PROPOFOL;  Surgeon: Christene Lye, MD;  Location: [facility] ENDOSCOPY;  Service: Endoscopy;  Laterality: N/A;  . COLONOSCOPY WITH PROPOFOL N/A 02/17/2018   Procedure: COLONOSCOPY WITH PROPOFOL;  Surgeon: Virgel Manifold, MD;  Location: [facility] ENDOSCOPY;  Service: Endoscopy;  Laterality: N/A;  . LIPOMA EXCISION  07/2015   right shoulder  . LIPOMA EXCISION  11/19/2015  . OOPHORECTOMY     for "fibroids" not sure which ovary    Social History   Tobacco Use  . Smoking status: Former Smoker    Packs/day: 0.50     Years: 15.00    Pack years: 7.50    Types: Cigarettes    Last attempt to quit: 10/24/2016    Years since quitting: 1.6  . Smokeless tobacco: Never Used  Substance Use Topics  . Alcohol use: Yes    Alcohol/week: 4.0 standard drinks    Types: 4 Standard drinks or equivalent per week    Comment: on occasion     Current Outpatient Medications:  .  aspirin EC 81 MG tablet, Take 81 mg by mouth daily., Disp: , Rfl:  .  Cholecalciferol (VITAMIN D3) 20 MCG TABS, Take 25 mcg by mouth daily., Disp: , Rfl:  .  Cyanocobalamin (B-12) 500 MCG TABS, Take 2,500 mcg by mouth daily. , Disp: , Rfl:  .  Echinacea 125 MG CAPS, Take 400 mg as needed by mouth. , Disp: , Rfl:  .  meloxicam (MOBIC) 7.5 MG tablet, Take 1 tablet (7.5 mg total) by mouth daily as needed for pain., Disp: , Rfl:  .  Multiple Vitamin (MULTIVITAMIN) tablet, Take 1 tablet by mouth daily., Disp: , Rfl:  .  Omega-3 Fatty Acids (FISH OIL PO), Take 1,200 mg daily by mouth. , Disp: , Rfl:  .  Turmeric 500 MG CAPS, Take 300 mg by mouth daily. , Disp: , Rfl:  .  SUPREP BOWEL PREP KIT 17.5-3.13-1.6 GM/177ML SOLN, MIX AND DRINK UTD, Disp: , Rfl:   No Known Allergies  ROS   No other specific complaints in a complete review of systems (except as listed in HPI above).  Objective  Vitals:   06/05/18 1329 06/05/18 1335  BP: (!) 147/91 125/79  Pulse: 71 65  Temp: (!) 96.5 F (35.8 C)   TempSrc: Oral   Weight: 218 lb (98.9 kg)   Height: _0  (1.651 m)      Body mass index is 36.28 kg/m.  Nursing Note and Vital Signs reviewed.  Physical Exam Musculoskeletal:       Legs:      Constitutional: Patient appears well-developed and well-nourished. No distress.  HENT: Head: Normocephalic and atraumatic. Cardiovascular: Normal rate Pulmonary/Chest: Effort normal  Musculoskeletal: Normal range of motion, negative straight leg test.  Neurological: alert and oriented, speech normal.  Skin: No rash noted. No erythema.  Psychiatric:  Patient has a normal mood and affect. behavior is normal. Judgment and thought content normal.    Assessment & Plan 1. Essential hypertension, benign Stable, DASH  2. Osteopenia, unspecified location Discussed OTC supplem  3. Left hip pain Bursitis versus sciatica versus meralgia paresthetica versus osteoarthritis versus muscle strain.  Discussed red flag symptoms and return of care symptoms.  Think it is appropriate to use meloxicam just as needed as pain is resolving appears to be self-limiting benign condition.   Follow Up Instructions:  PRN I discussed the  assessment and treatment plan with the patient. The patient was provided an opportunity to ask questions and all were answered. The patient agreed with the plan and demonstrated an understanding of the instructions.   The patient was advised to call back or seek an in-person evaluation if the symptoms worsen or if the condition fails to improve as anticipated.  I provided 23 minutes of non-face-to-face time during this encounter.   Fredderick Severance, NP

## 2018-07-13 ENCOUNTER — Ambulatory Visit (INDEPENDENT_AMBULATORY_CARE_PROVIDER_SITE_OTHER): Payer: Medicare Other

## 2018-07-13 ENCOUNTER — Ambulatory Visit: Payer: Medicare Other | Admitting: Family Medicine

## 2018-07-13 VITALS — BP 131/83 | HR 74 | Temp 97.8°F | Ht 65.0 in | Wt 218.0 lb

## 2018-07-13 DIAGNOSIS — Z0000 Encounter for general adult medical examination without abnormal findings: Secondary | ICD-10-CM

## 2018-07-13 NOTE — Patient Instructions (Signed)
Allison Phillips , Thank you for taking time to come for your Medicare Wellness Visit. I appreciate your ongoing commitment to your health goals. Please review the following plan we discussed and let me know if I can assist you in the future.   Screening recommendations/referrals: Colonoscopy: done 02/17/18 Mammogram: done 02/06/18 Bone Density: done 04/20/17 Recommended yearly ophthalmology/optometry visit for glaucoma screening and checkup Recommended yearly dental visit for hygiene and checkup  Vaccinations: Influenza vaccine: done 11/14/17 Pneumococcal vaccine: done 12/06/16 Tdap vaccine: due - please contact us if you get a cut or scrape Shingles vaccine: Shingrix discussed. Please contact your pharmacy for coverage information.   Advanced directives: Advance directive discussed with you today. I have provided a copy for you to complete at home and have notarized. Once this is complete please bring a copy in to our office so we can scan it into your chart.  Conditions/risks identified: Recommend drinking 6-8 glasses of water per day.  Next appointment: Please follow up in one year for your Medicare Annual Wellness visit.     Preventive Care 57 Years and Older, Female Preventive care refers to lifestyle choices and visits with your health care provider that can promote health and wellness. What does preventive care include?  A yearly physical exam. This is also called an annual well check.  Dental exams once or twice a year.  Routine eye exams. Ask your health care provider how often you should have your eyes checked.  Personal lifestyle choices, including:  Daily care of your teeth and gums.  Regular physical activity.  Eating a healthy diet.  Avoiding tobacco and drug use.  Limiting alcohol use.  Practicing safe sex.  Taking low-dose aspirin every day.  Taking vitamin and mineral supplements as recommended by your health care provider. What happens during an annual  well check? The services and screenings done by your health care provider during your annual well check will depend on your age, overall health, lifestyle risk factors, and family history of disease. Counseling  Your health care provider may ask you questions about your:  Alcohol use.  Tobacco use.  Drug use.  Emotional well-being.  Home and relationship well-being.  Sexual activity.  Eating habits.  History of falls.  Memory and ability to understand (cognition).  Work and work Statistician.  Reproductive health. Screening  You may have the following tests or measurements:  Height, weight, and BMI.  Blood pressure.  Lipid and cholesterol levels. These may be checked every 5 years, or more frequently if you are over 56 years old.  Skin check.  Lung cancer screening. You may have this screening every year starting at age 47 if you have a 30-pack-year history of smoking and currently smoke or have quit within the past 15 years.  Fecal occult blood test (FOBT) of the stool. You may have this test every year starting at age 31.  Flexible sigmoidoscopy or colonoscopy. You may have a sigmoidoscopy every 5 years or a colonoscopy every 10 years starting at age 48.  Hepatitis C blood test.  Hepatitis B blood test.  Sexually transmitted disease (STD) testing.  Diabetes screening. This is done by checking your blood sugar (glucose) after you have not eaten for a while (fasting). You may have this done every 1-3 years.  Bone density scan. This is done to screen for osteoporosis. You may have this done starting at age 47.  Mammogram. This may be done every 1-2 years. Talk to your health care provider about  how often you should have regular mammograms. Talk with your health care provider about your test results, treatment options, and if necessary, the need for more tests. Vaccines  Your health care provider may recommend certain vaccines, such as:  Influenza vaccine. This  is recommended every year.  Tetanus, diphtheria, and acellular pertussis (Tdap, Td) vaccine. You may need a Td booster every 10 years.  Zoster vaccine. You may need this after age 77.  Pneumococcal 13-valent conjugate (PCV13) vaccine. One dose is recommended after age 4.  Pneumococcal polysaccharide (PPSV23) vaccine. One dose is recommended after age 39. Talk to your health care provider about which screenings and vaccines you need and how often you need them. This information is not intended to replace advice given to you by your health care provider. Make sure you discuss any questions you have with your health care provider. Document Released: 01/31/2015 Document Revised: 09/24/2015 Document Reviewed: 11/05/2014 Elsevier Interactive Patient Education  2017 [place] Prevention in the Home Falls can cause injuries. They can happen to people of all ages. There are many things you can do to make your home safe and to help prevent falls. What can I do on the outside of my home?  Regularly fix the edges of walkways and driveways and fix any cracks.  Remove anything that might make you trip as you walk through a door, such as a raised step or threshold.  Trim any bushes or trees on the path to your home.  Use bright outdoor lighting.  Clear any walking paths of anything that might make someone trip, such as rocks or tools.  Regularly check to see if handrails are loose or broken. Make sure that both sides of any steps have handrails.  Any raised decks and porches should have guardrails on the edges.  Have any leaves, snow, or ice cleared regularly.  Use sand or salt on walking paths during winter.  Clean up any spills in your garage right away. This includes oil or grease spills. What can I do in the bathroom?  Use night lights.  Install grab bars by the toilet and in the tub and shower. Do not use towel bars as grab bars.  Use non-skid mats or decals in the tub or  shower.  If you need to sit down in the shower, use a plastic, non-slip stool.  Keep the floor dry. Clean up any water that spills on the floor as soon as it happens.  Remove soap buildup in the tub or shower regularly.  Attach bath mats securely with double-sided non-slip rug tape.  Do not have throw rugs and other things on the floor that can make you trip. What can I do in the bedroom?  Use night lights.  Make sure that you have a light by your bed that is easy to reach.  Do not use any sheets or blankets that are too big for your bed. They should not hang down onto the floor.  Have a firm chair that has side arms. You can use this for support while you get dressed.  Do not have throw rugs and other things on the floor that can make you trip. What can I do in the kitchen?  Clean up any spills right away.  Avoid walking on wet floors.  Keep items that you use a lot in easy-to-reach places.  If you need to reach something above you, use a strong step stool that has a grab bar.  Keep  electrical cords out of the way.  Do not use floor polish or wax that makes floors slippery. If you must use wax, use non-skid floor wax.  Do not have throw rugs and other things on the floor that can make you trip. What can I do with my stairs?  Do not leave any items on the stairs.  Make sure that there are handrails on both sides of the stairs and use them. Fix handrails that are broken or loose. Make sure that handrails are as long as the stairways.  Check any carpeting to make sure that it is firmly attached to the stairs. Fix any carpet that is loose or worn.  Avoid having throw rugs at the top or bottom of the stairs. If you do have throw rugs, attach them to the floor with carpet tape.  Make sure that you have a light switch at the top of the stairs and the bottom of the stairs. If you do not have them, ask someone to add them for you. What else can I do to help prevent falls?   Wear shoes that:  Do not have high heels.  Have rubber bottoms.  Are comfortable and fit you well.  Are closed at the toe. Do not wear sandals.  If you use a stepladder:  Make sure that it is fully opened. Do not climb a closed stepladder.  Make sure that both sides of the stepladder are locked into place.  Ask someone to hold it for you, if possible.  Clearly mark and make sure that you can see:  Any grab bars or handrails.  First and last steps.  Where the edge of each step is.  Use tools that help you move around (mobility aids) if they are needed. These include:  Canes.  Walkers.  Scooters.  Crutches.  Turn on the lights when you go into a dark area. Replace any light bulbs as soon as they burn out.  Set up your furniture so you have a clear path. Avoid moving your furniture around.  If any of your floors are uneven, fix them.  If there are any pets around you, be aware of where they are.  Review your medicines with your doctor. Some medicines can make you feel dizzy. This can increase your chance of falling. Ask your doctor what other things that you can do to help prevent falls. This information is not intended to replace advice given to you by your health care provider. Make sure you discuss any questions you have with your health care provider. Document Released: 10/31/2008 Document Revised: 06/12/2015 Document Reviewed: 02/08/2014 Elsevier Interactive Patient Education  2017 [employer].

## 2018-07-13 NOTE — Progress Notes (Signed)
Subjective:   Allison Phillips is a 68 y.o. female who presents for Medicare Annual (Subsequent) preventive examination.  Virtual Visit via Telephone Note  I connected with Allison Phillips on 07/13/18 at  8:40 AM EDT by telephone and verified that I am speaking with the correct person using two identifiers.  Medicare Annual Wellness visit completed telephonically due to Covid-19 pandemic.   Location: Patient: home Provider: office   I discussed the limitations, risks, security and privacy concerns of performing an evaluation and management service by telephone and the availability of in person appointments. The patient expressed understanding and agreed to proceed.  Some vital signs may be absent or patient reported.   Clemetine Marker, LPN  Review of Systems:         Objective:     Vitals: BP 131/83   Pulse 74   Temp 97.8 F (36.6 C) (Oral)   Ht 5\' 5"  (1.651 m)   Wt 218 lb (98.9 kg)   LMP  (LMP Unknown) Comment: rt ovary removed  BMI 36.28 kg/m   Body mass index is 36.28 kg/m.  Advanced Directives 07/13/2018 02/17/2018 12/03/2017 07/23/2016 03/23/2016  Does Patient Have a Medical Advance Directive? No No No Yes No  Would patient like information on creating a medical advance directive? Yes (MAU/Ambulatory/Procedural Areas - Information given) - No - Patient declined - No - Patient declined    Tobacco Social History   Tobacco Use  Smoking Status Former Smoker  . Packs/day: 0.50  . Years: 15.00  . Pack years: 7.50  . Types: Cigarettes  . Quit date: 10/24/2016  . Years since quitting: 1.7  Smokeless Tobacco Never Used     Counseling given: Not Answered   Clinical Intake:  Pre-visit preparation completed: Yes  Pain : No/denies pain     BMI - recorded: 36.28 Nutritional Status: BMI > 30  Obese Nutritional Risks: None Diabetes: No  How often do you need to have someone help you when you read instructions, pamphlets, or other written materials from your  doctor or pharmacy?: 1 - Never  Interpreter Needed?: No  Information entered by :: Clemetine Marker LPN  Past Medical History:  Diagnosis Date  . Bursitis of left hip   . Depression   . Diverticulitis   . Hypertension   . Joint pain   . Obesity   . Osteopenia 04/20/2017   April 2019; next DEXA April 2021  . Osteoporosis   . Prediabetes   . Reflux   . Tobacco use    Past Surgical History:  Procedure Laterality Date  . ANAL FISSURE REPAIR    . APPENDECTOMY    . [place]  . COLONOSCOPY WITH PROPOFOL N/A 12/18/2014   Procedure: COLONOSCOPY WITH PROPOFOL;  Surgeon: Christene Lye, MD;  Location: [facility] ENDOSCOPY;  Service: Endoscopy;  Laterality: N/A;  . COLONOSCOPY WITH PROPOFOL N/A 02/17/2018   Procedure: COLONOSCOPY WITH PROPOFOL;  Surgeon: Virgel Manifold, MD;  Location: [facility] ENDOSCOPY;  Service: Endoscopy;  Laterality: N/A;  . LIPOMA EXCISION  07/2015   right shoulder  . LIPOMA EXCISION  11/19/2015  . OOPHORECTOMY     for "fibroids" not sure which ovary   Family History  Problem Relation Age of Onset  . Hypertension Mother   . Kidney disease Mother   . Alzheimer's disease Mother   . Thyroid cancer Son   . Lung cancer Brother   . Rectal cancer Brother   . Breast cancer Neg Hx  Social History   Socioeconomic History  . Marital status: Married    Spouse name: Nadara Mustard  . Number of children: 1  . Years of education: Not on file  . Highest education level: Bachelor's degree (e.g., BA, AB, BS)  Occupational History  . Occupation: Retired  Scientific laboratory technician  . Financial resource strain: Not hard at all  . Food insecurity    Worry: Never true    Inability: Never true  . Transportation needs    Medical: No    Non-medical: No  Tobacco Use  . Smoking status: Former Smoker    Packs/day: 0.50    Years: 15.00    Pack years: 7.50    Types: Cigarettes    Quit date: 10/24/2016    Years since quitting: 1.7  . Smokeless tobacco: Never Used  Substance  and Sexual Activity  . Alcohol use: Yes    Alcohol/week: 4.0 standard drinks    Types: 4 Standard drinks or equivalent per week    Comment: on occasion  . Drug use: No  . Sexual activity: Yes    Partners: Male  Lifestyle  . Physical activity    Days per week: 0 days    Minutes per session: 0 min  . Stress: Only a little  Relationships  . Social connections    Talks on phone: More than three times a week    Gets together: Twice a week    Attends religious service: More than 4 times per year    Active member of club or organization: Yes    Attends meetings of clubs or organizations: More than 4 times per year    Relationship status: Married  Other Topics Concern  . Not on file  Social History Narrative  . Not on file    Outpatient Encounter Medications as of 07/13/2018  Medication Sig  . aspirin EC 81 MG tablet Take 81 mg by mouth daily.  . Cholecalciferol (VITAMIN D3) 20 MCG TABS Take 25 mcg by mouth daily.  . Cyanocobalamin (B-12) 500 MCG TABS Take 2,500 mcg by mouth daily.   . Echinacea 125 MG CAPS Take 400 mg as needed by mouth.   . meloxicam (MOBIC) 7.5 MG tablet Take 1 tablet (7.5 mg total) by mouth daily as needed for pain.  . Multiple Vitamin (MULTIVITAMIN) tablet Take 1 tablet by mouth daily.  . Omega-3 Fatty Acids (FISH OIL PO) Take 1,200 mg daily by mouth.   . Turmeric 500 MG CAPS Take 300 mg by mouth daily.    No facility-administered encounter medications on file as of 07/13/2018.     Activities of Daily Living In your present state of health, do you have any difficulty performing the following activities: 07/13/2018 06/05/2018  Hearing? N N  Comment declines hearing aids -  Vision? N N  Difficulty concentrating or making decisions? N N  Walking or climbing stairs? N N  Dressing or bathing? N N  Doing errands, shopping? N N  Preparing Food and eating ? N -  Using the Toilet? N -  In the past six months, have you accidently leaked urine? N -  Do you have  problems with loss of bowel control? N -  Managing your Medications? N -  Managing your Finances? N -  Housekeeping or managing your Housekeeping? N -  Some recent data might be hidden    Patient Care Team: Lada, Satira Anis, MD as PCP - General (Family Medicine) Christene Lye, MD (General Surgery) Sharlet Salina,  MD as Referring Physician (Physical Medicine and Rehabilitation) Hessie Knows, MD as Consulting Physician (Orthopedic Surgery) Renata Caprice as Physician Assistant (Orthopedic Surgery)    Assessment:   This is a routine wellness examination for [place].  Exercise Activities and Dietary recommendations    Goals    . DIET - INCREASE WATER INTAKE     Recommend drinking 6-8 glasses of water per day    . Quit smoking / using tobacco    . Weight (lb) < 200 lb (90.7 kg)       Fall Risk Fall Risk  07/13/2018 06/05/2018 01/06/2018 11/14/2017 10/06/2017  Falls in the past year? 1 0 1 No No  Number falls in past yr: 1 0 0 - -  Injury with Fall? 1 0 1 - -  Follow up Falls prevention discussed - - - -   FALL RISK PREVENTION PERTAINING TO THE HOME:  Any stairs in or around the home? No  If so, do they handrails? No   Home free of loose throw rugs in walkways, pet beds, electrical cords, etc? Yes  Adequate lighting in your home to reduce risk of falls? Yes   ASSISTIVE DEVICES UTILIZED TO PREVENT FALLS:  Life alert? No  Use of a cane, walker or w/c? No  Grab bars in the bathroom? Yes Shower chair or bench in shower? No  Elevated toilet seat or a handicapped toilet? No   DME ORDERS:  DME order needed?  No   TIMED UP AND GO:  Was the test performed? No . Telephonic visit.   Education: Fall risk prevention has been discussed.  Intervention(s) required? No   Depression Screen PHQ 2/9 Scores 07/13/2018 06/05/2018 01/06/2018 10/06/2017  PHQ - 2 Score 0 2 0 0  PHQ- 9 Score - 3 0 0     Cognitive Function Pt declined 6CIT for 2020 AWV     6CIT  Screen 12/06/2016  What Year? 0 points  What month? 0 points  What time? 0 points  Count back from 20 0 points  Months in reverse 0 points  Repeat phrase 0 points  Total Score 0    Immunization History  Administered Date(s) Administered  . Influenza, High Dose Seasonal PF 11/14/2017  . Pneumococcal Conjugate-13 12/06/2016  . Pneumococcal Polysaccharide-23 11/02/2010  . Td 10/23/2007  . Zoster 11/16/2011    Qualifies for Shingles Vaccine? Yes  Zostavax completed 2013. Due for Shingrix. Education has been provided regarding the importance of this vaccine. Pt has been advised to call insurance company to determine out of pocket expense. Advised may also receive vaccine at local pharmacy or Health Dept. Verbalized acceptance and understanding.  Tdap: Although this vaccine is not a covered service during a Wellness Exam, does the patient still wish to receive this vaccine today?  No .  Education has been provided regarding the importance of this vaccine. Advised may receive this vaccine at local pharmacy or Health Dept. Aware to provide a copy of the vaccination record if obtained from local pharmacy or Health Dept. Verbalized acceptance and understanding.  Flu Vaccine: Up to date  Pneumococcal Vaccine: Up to date   Screening Tests Health Maintenance  Topic Date Due  . PNA vac Low Risk Adult (2 of 2 - PPSV23) 12/06/2017  . URINE MICROALBUMIN  12/24/2017  . OPHTHALMOLOGY EXAM  06/01/2018  . FOOT EXAM  07/08/2018  . HEMOGLOBIN A1C  07/08/2018  . TETANUS/TDAP  01/07/2019 (Originally 10/22/2017)  . INFLUENZA VACCINE  08/19/2018  .  MAMMOGRAM  02/07/2019  . DEXA SCAN  04/21/2019  . COLONOSCOPY  02/18/2023  . Hepatitis C Screening  Completed    Cancer Screenings:  Colorectal Screening: Completed 02/17/18. Repeat every 5 years.  Mammogram: Completed 02/06/18. Repeat every year.  Bone Density: Completed 04/20/17. Results reflect  OSTEOPENIA. Repeat every 2 years.   Lung Cancer  Screening: (Low Dose CT Chest recommended if Age 68-80 years, 30 pack-year currently smoking OR have quit w/in 15years.) does not qualify.    Additional Screening:  Hepatitis C Screening: does qualify; Completed 12/24/16  Vision Screening: Recommended annual ophthalmology exams for early detection of glaucoma and other disorders of the eye. Is the patient up to date with their annual eye exam?  Yes  Who is the provider or what is the name of the office in which the pt attends annual eye exams? [facility]  Dental Screening: Recommended annual dental exams for proper oral hygiene  Community Resource Referral:  CRR required this visit?  No      Plan:    I have personally reviewed and addressed the Medicare Annual Wellness questionnaire and have noted the following in the patient's chart:  A. Medical and social history B. Use of alcohol, tobacco or illicit drugs  C. Current medications and supplements D. Functional ability and status E.  Nutritional status F.  Physical activity G. Advance directives H. List of other physicians I.  Hospitalizations, surgeries, and ER visits in previous 12 months J.  [place] such as hearing and vision if needed, cognitive and depression L. Referrals and appointments   In addition, I have reviewed and discussed with patient certain preventive protocols, quality metrics, and best practice recommendations. A written personalized care plan for preventive services as well as general preventive health recommendations were provided to patient.   Signed,  Clemetine Marker, LPN Nurse Health Advisor   Nurse Notes:

## 2018-08-15 ENCOUNTER — Encounter: Payer: BC Managed Care – PPO | Admitting: Nurse Practitioner

## 2018-08-28 ENCOUNTER — Ambulatory Visit (INDEPENDENT_AMBULATORY_CARE_PROVIDER_SITE_OTHER): Payer: Medicare Other | Admitting: Nurse Practitioner

## 2018-08-28 ENCOUNTER — Encounter: Payer: Self-pay | Admitting: Nurse Practitioner

## 2018-08-28 ENCOUNTER — Ambulatory Visit
Admission: RE | Admit: 2018-08-28 | Discharge: 2018-08-28 | Disposition: A | Discharge status: Home / Self Care | Payer: Medicare Other | Attending: Nurse Practitioner | Admitting: Nurse Practitioner

## 2018-08-28 ENCOUNTER — Other Ambulatory Visit: Payer: Self-pay

## 2018-08-28 ENCOUNTER — Ambulatory Visit
Admission: RE | Admit: 2018-08-28 | Discharge: 2018-08-28 | Disposition: A | Discharge status: Home / Self Care | Payer: Medicare Other | Source: Ambulatory Visit | Attending: Nurse Practitioner | Admitting: Nurse Practitioner

## 2018-08-28 VITALS — BP 136/78 | HR 85 | Temp 96.6°F | Resp 14 | Ht 65.0 in

## 2018-08-28 DIAGNOSIS — R079 Chest pain, unspecified: Secondary | ICD-10-CM | POA: Insufficient documentation

## 2018-08-28 DIAGNOSIS — Z0000 Encounter for general adult medical examination without abnormal findings: Secondary | ICD-10-CM | POA: Diagnosis not present

## 2018-08-28 DIAGNOSIS — L309 Dermatitis, unspecified: Secondary | ICD-10-CM

## 2018-08-28 DIAGNOSIS — Z87898 Personal history of other specified conditions: Secondary | ICD-10-CM

## 2018-08-28 DIAGNOSIS — Z23 Encounter for immunization: Secondary | ICD-10-CM

## 2018-08-28 DIAGNOSIS — L304 Erythema intertrigo: Secondary | ICD-10-CM

## 2018-08-28 MED ORDER — TRIAMCINOLONE ACETONIDE 0.1 % EX CREA: 1.0000 "application " | TOPICAL_CREAM | Freq: Two times a day (BID) | CUTANEOUS | 0 refills | Status: DC

## 2018-08-28 NOTE — Progress Notes (Signed)
Name: Allison Phillips   MRN: [id number]    DOB: [birth year]   Date:08/28/2018       Progress Note  Subjective  Chief Complaint  Chief Complaint  Patient presents with  . Annual Exam  . Rash    bilateral elbows, onset since june  . Back Pain    on right side under shoulder blade    HPI  Patient presents for annual CPE .  Endorses rash ongoing since June- had been outside a lot that month- thought it was related not itchy, painful or spreading. Originally it was more inflamed red but has improved on its own and area has gotten smaller- located on bilateral elbows. Patient also notes itching under bilateral breasts.   Patient endorses intermittent right upper back  pain. Started a few weeks ago, has been working outside a lot thought it was a muscle strain but pain is not constant or worse with use. Pain is sharp lasts a few seconds and then resolves. States sometimes pain happens when she was not moving. Started smoking again about half a pack a day- started in march with the stress of the pandemic. Brother has lung cancer and she is concerned about this. Has 7.5pack year history. Denies chest tightness, cough and shortness of breath, nausea, lightheadedness. Area is tender to palpation. No decrease ROM    Diet:  Had a shake this morning Eats out a few times a week- hibachi chicken and rice yesterday Eats 2-3 servings of vegetables every day and at least on 1 fruit a day 4-5 glasses of water a day and flavored waters.  Exercise:  No routine activity   USPSTF grade A and B recommendations    Office Visit from 08/28/2018 in [facility]  AUDIT-C Score  3     Depression: Phq 9 is  Negative- feels a bit overwhelmed with pandemic- states things are improving, has phone tree and involved in church.  Depression screen [facility] 2/9 08/28/2018 07/13/2018 06/05/2018 01/06/2018 10/06/2017  Decreased Interest 1 0 1 0 0  Down, Depressed, Hopeless 1 0 1 0 0  PHQ - 2 Score 2 0 2 0 0   Altered sleeping 0 - 1 0 0  Tired, decreased energy 0 - 0 0 0  Change in appetite 0 - 0 0 0  Feeling bad or failure about yourself  0 - 0 0 0  Trouble concentrating 0 - 0 0 0  Moving slowly or fidgety/restless 0 - 0 0 0  Suicidal thoughts 0 - 0 0 0  PHQ-9 Score 2 - 3 0 0  Difficult doing work/chores Not difficult at all - - Not difficult at all Not difficult at all   Hypertension: BP Readings from Last 3 Encounters:  08/28/18 136/78  07/13/18 131/83  06/05/18 125/79   Obesity: Wt Readings from Last 3 Encounters:  07/13/18 218 lb (98.9 kg)  06/05/18 218 lb (98.9 kg)  02/17/18 218 lb (98.9 kg)   BMI Readings from Last 3 Encounters:  08/28/18 36.28 kg/m  07/13/18 36.28 kg/m  06/05/18 36.28 kg/m    Hep C Screening:  STD testing and prevention (HIV/chl/gon/syphilis): denies  Intimate partner violence: denies  Sexual History/Pain during Intercourse: denies  Menstrual History/LMP/Abnormal Bleeding: denies   Advanced Care Planning: A voluntary discussion about advance care planning including the explanation and discussion of advance directives.  Discussed health care proxy and Living will, and the patient was able to identify a health care proxy as husband.  Patient  does not have a living will at present time. If patient does have living will, I have requested they bring this to the clinic to be scanned in to their chart.  Breast cancer:  Completed January 2020- No mammographic evidence of malignancy. HM Mammogram  Date Value Ref Range Status  12/28/2013 from [facility]  Final     Osteoporosis Screening: completed on 2019- ostepenia.  No results found for: HMDEXASCAN  Lipids:  Lab Results  Component Value Date   CHOL 159 12/24/2016   CHOL 154 04/27/2016   CHOL 159 11/26/2015   Lab Results  Component Value Date   HDL 75 12/24/2016   HDL 65 04/27/2016   HDL 62 11/26/2015   Lab Results  Component Value Date   LDLCALC 66 12/24/2016   LDLCALC 53 04/27/2016   LDLCALC 69  11/26/2015   Lab Results  Component Value Date   TRIG 94 12/24/2016   TRIG 179 (H) 04/27/2016   TRIG 141 11/26/2015   Lab Results  Component Value Date   CHOLHDL 2.1 12/24/2016   No results found for: LDLDIRECT  Glucose:  Glucose, Bld  Date Value Ref Range Status  01/06/2018 87 65 - 99 mg/dL Final    Comment:    .            Fasting reference interval .   12/24/2016 90 65 - 99 mg/dL Final    Comment:    .            Fasting reference interval .   07/23/2016 92 65 - 99 mg/dL Final    Skin cancer: discussed  Colorectal cancer: due 2025 Lung cancer:  Low Dose CT Chest recommended if Age 67-80 years, 30 pack-year currently smoking OR have quit w/in 15years. Patient does not qualify.     Patient Active Problem List   Diagnosis Date Noted  . Prediabetes 01/06/2018  . Obesity (BMI 35.0-39.9 without comorbidity) 01/06/2018  . CKD (chronic kidney disease) stage 2, GFR 60-89 ml/min 07/07/2017  . Osteopenia 04/20/2017  . Hip pain, chronic, left 03/18/2017  . Lower back pain 03/18/2017  . Sacroiliac joint pain 03/18/2017  . Screening for osteoporosis 12/30/2016  . Preventative health care 12/07/2016  . Encounter for hepatitis C screening test for low risk patient 12/06/2016  . Essential hypertension, benign 03/08/2016  . Cervical pain (neck) 12/31/2014  . Lipoma of skin and subcutaneous tissue of neck 08/07/2014  . Reflux   . Depression   . Bursitis of left hip     Past Surgical History:  Procedure Laterality Date  . ANAL FISSURE REPAIR    . APPENDECTOMY    . [place]  . COLONOSCOPY WITH PROPOFOL N/A 12/18/2014   Procedure: COLONOSCOPY WITH PROPOFOL;  Surgeon: Christene Lye, MD;  Location: [facility] ENDOSCOPY;  Service: Endoscopy;  Laterality: N/A;  . COLONOSCOPY WITH PROPOFOL N/A 02/17/2018   Procedure: COLONOSCOPY WITH PROPOFOL;  Surgeon: Virgel Manifold, MD;  Location: [facility] ENDOSCOPY;  Service: Endoscopy;  Laterality: N/A;  . LIPOMA  EXCISION  07/2015   right shoulder  . LIPOMA EXCISION  11/19/2015  . OOPHORECTOMY     for "fibroids" not sure which ovary    Family History  Problem Relation Age of Onset  . Hypertension Mother   . Kidney disease Mother   . Alzheimer's disease Mother   . Thyroid cancer Son   . Lung cancer Brother   . Rectal cancer Brother   . Breast cancer Neg Hx  Social History   Socioeconomic History  . Marital status: Married    Spouse name: Nadara Mustard  . Number of children: 1  . Years of education: Not on file  . Highest education level: Bachelor's degree (e.g., BA, AB, BS)  Occupational History  . Occupation: Retired  Scientific laboratory technician  . Financial resource strain: Not hard at all  . Food insecurity    Worry: Never true    Inability: Never true  . Transportation needs    Medical: No    Non-medical: No  Tobacco Use  . Smoking status: Former Smoker    Packs/day: 0.50    Years: 15.00    Pack years: 7.50    Types: Cigarettes    Quit date: 10/24/2016    Years since quitting: 1.8  . Smokeless tobacco: Never Used  Substance and Sexual Activity  . Alcohol use: Yes    Alcohol/week: 4.0 standard drinks    Types: 4 Standard drinks or equivalent per week    Comment: on occasion  . Drug use: No  . Sexual activity: Yes    Partners: Male  Lifestyle  . Physical activity    Days per week: 0 days    Minutes per session: 0 min  . Stress: Only a little  Relationships  . Social connections    Talks on phone: More than three times a week    Gets together: Twice a week    Attends religious service: More than 4 times per year    Active member of club or organization: Yes    Attends meetings of clubs or organizations: More than 4 times per year    Relationship status: Married  . Intimate partner violence    Fear of current or ex partner: No    Emotionally abused: No    Physically abused: No    Forced sexual activity: No  Other Topics Concern  . Not on file  Social History Narrative  .  Not on file     Current Outpatient Medications:  .  aspirin EC 81 MG tablet, Take 81 mg by mouth daily., Disp: , Rfl:  .  Cholecalciferol (VITAMIN D3) 20 MCG TABS, Take 25 mcg by mouth daily., Disp: , Rfl:  .  Cyanocobalamin (B-12) 500 MCG TABS, Take 2,500 mcg by mouth daily. , Disp: , Rfl:  .  Echinacea 125 MG CAPS, Take 400 mg as needed by mouth. , Disp: , Rfl:  .  meloxicam (MOBIC) 7.5 MG tablet, Take 1 tablet (7.5 mg total) by mouth daily as needed for pain., Disp: 30 tablet, Rfl: 1 .  Multiple Vitamin (MULTIVITAMIN) tablet, Take 1 tablet by mouth daily., Disp: , Rfl:  .  Omega-3 Fatty Acids (FISH OIL PO), Take 1,200 mg daily by mouth. , Disp: , Rfl:  .  Turmeric 500 MG CAPS, Take 300 mg by mouth daily. , Disp: , Rfl:   No Known Allergies   Review of Systems  Constitutional: Negative for chills, fever and malaise/fatigue.  HENT: Negative for congestion, sinus pain and sore throat.   Eyes: Negative for blurred vision.  Respiratory: Negative for cough and shortness of breath.   Cardiovascular: Positive for chest pain (right posterior). Negative for palpitations and leg swelling.  Gastrointestinal: Negative for abdominal pain, constipation, diarrhea and nausea.  Genitourinary: Negative for dysuria.  Musculoskeletal: Positive for falls. Negative for joint pain (last november and broke foot- improved).  Skin: Negative for rash.  Neurological: Negative for dizziness, tingling and headaches.  Endo/Heme/Allergies: Negative for  polydipsia.  Psychiatric/Behavioral: The patient is nervous/anxious. The patient does not have insomnia.       Objective  Vitals:   08/28/18 1113  BP: 136/78  Pulse: 85  Resp: 14  Temp: (!) 96.6 F (35.9 C)  SpO2: 95%  Height: 5\' 5"  (1.651 m)    Body mass index is 36.28 kg/m.  Physical Exam Musculoskeletal:     Right shoulder: Normal.       Arms:    Constitutional: Patient appears well-developed and well-nourished. No distress.  HENT: Head:  Normocephalic and atraumatic. Ears: B TMs ok, no erythema or effusion; Nose: Nose normal. Mouth/Throat: Oropharynx is clear and moist. No oropharyngeal exudate.  Eyes: Conjunctivae and EOM are normal. Pupils are equal, round, and reactive to light. No scleral icterus.  Neck: Normal range of motion. Neck supple. No JVD present. No thyromegaly present.  Cardiovascular: Normal rate, regular rhythm and normal heart sounds.  No murmur heard. No BLE edema. Pulmonary/Chest: Effort normal and breath sounds normal. No respiratory distress. Abdominal: Soft. Bowel sounds are normal, no distension. There is no tenderness. no masses Breast: no lumps or masses, no nipple discharge or rashes FEMALE GENITALIA: deferred  Musculoskeletal: Normal range of motion, no joint effusions. No gross deformities Neurological: he is alert and oriented to person, place, and time. No cranial nerve deficit. Coordination, balance, strength, speech and gait are normal.  Skin: Skin is warm and dry. No rash noted. No erythema.  small raised bumps noted on bilateral elbows. Some pruritis under breast- no obvious rash Psychiatric: Patient has a normal mood and affect. behavior is normal. Judgment and thought content normal.    No results found for this or any previous visit (from the past 2160 hour(s)).  Fall Risk: Fall Risk  08/28/2018 07/13/2018 06/05/2018 01/06/2018 11/14/2017  Falls in the past year? 1 1 0 1 No  Number falls in past yr: 1 1 0 0 -  Injury with Fall? 1 1 0 1 -  Follow up - Falls prevention discussed - - -     Functional Status Survey: Is the patient deaf or have difficulty hearing?: No Does the patient have difficulty seeing, even when wearing glasses/contacts?: No Does the patient have difficulty concentrating, remembering, or making decisions?: No Does the patient have difficulty walking or climbing stairs?: No Does the patient have difficulty dressing or bathing?: No Does the patient have difficulty  doing errands alone such as visiting a doctor's office or shopping?: No   Assessment & Plan  1. Preventative health care - COMPLETE METABOLIC PANEL WITH GFR - HgB A1c - Lipid Profile  2. Right-sided chest pain Likely muscular- if xray is clear consider heat and low dose muscle relaxer  - DG Chest 2 View; Future  3. Need for pneumococcal vaccination - Pneumococcal polysaccharide vaccine 23-valent greater than or equal to 2yo subcutaneous/IM  4. Dermatitis - triamcinolone cream (KENALOG) 0.1 %; Apply 1 application topically 2 (two) times daily.  Dispense: 30 g; Refill: 0  5. Intertrigo - triamcinolone cream (KENALOG) 0.1 %; Apply 1 application topically 2 (two) times daily.  Dispense: 30 g; Refill: 0  6. History of prediabetes - HgB A1c   -USPSTF grade A and B recommendations reviewed with patient; age-appropriate recommendations, preventive care, screening tests, etc discussed and encouraged; healthy living encouraged; see AVS for patient education given to patient -Discussed importance of 150 minutes of physical activity weekly, eat two servings of fish weekly, eat one serving of tree nuts ( cashews, pistachios, pecans,  almonds.Marland Kitchen) every other day, eat 6 servings of fruit/vegetables daily and drink plenty of water and avoid sweet beverages.

## 2018-08-28 NOTE — Patient Instructions (Signed)
-   please call and discuss shingrex vaccine with your insurance company and we are happy to give it here as a nurse visit- just call to schedule, however they may want it done at the pharmacy General recommendations: 150 minutes of physical activity weekly, eat two servings of fish weekly, eat one serving of tree nuts ( cashews, pistachios, pecans, almonds.Marland Kitchen) every other day, eat 6 servings of fruit/vegetables daily and drink plenty of water and avoid sweet beverages. Recommend at least 64 ounces of water daily.    Stay Safe in the [place] The majority of sun exposure occurs before age 78 and skin cancer can take 20 years or more to develop. Whether your sun bathing days are behind you or you still spend time pursuing the perfect tan, you should be concerned about skin cancer.  Remember, the sun's ultraviolet (UV) rays can reflect off water, sand, concrete and snow, and can reach below the water's surface. Certain types of UV light penetrate fog and clouds, so it's possible to get sunburn even on overcast days.  Avoid direct sunlight as much as possible during the peak sun hours, generally 10 a.m. to 3 p.m., or seek shade during this part of day. Wear broad-spectrum sunscreen - with an SPF of at least 30 - containing both UVA and UVB protection. Look for ingredients like [employer] (also known as avobenzone) or titanium dioxide on the label. Reapply sunscreen frequently, at least every two hours when outdoors, especially if you perspire or you've been swimming. Your best bet is to choose water-resistant products that are more likely to stay on your skin. Wear lip balm with an SPF 15 or higher. Wear a hat and other protective clothing while in the sun. Tightly woven fibers and darker clothing generally provide more protection. Also, look for products approved by the American Academy of Dermatology. Wear UV-protective sunglasses.

## 2018-08-29 LAB — LIPID PANEL
Cholesterol: 156 mg/dL (ref <200)
HDL: 67 mg/dL (ref >=50)
LDL Cholesterol (Calc): 68 mg/dL (calc)
Non-HDL Cholesterol (Calc): 89 mg/dL (calc) (ref <130)
Total CHOL/HDL Ratio: 2.3 (calc) (ref <5.0)
Triglycerides: 120 mg/dL (ref <150)

## 2018-08-29 LAB — HEMOGLOBIN A1C
Hgb A1c MFr Bld: 5.7 % of total Hgb — ABNORMAL HIGH (ref <5.7)
Mean Plasma Glucose: 117 (calc)
eAG (mmol/L): 6.5 (calc)

## 2018-08-29 LAB — COMPLETE METABOLIC PANEL WITH GFR
AG Ratio: 1.6 (calc) (ref 1.0–2.5)
ALT: 10 U/L (ref 6–29)
AST: 15 U/L (ref 10–35)
Albumin: 4.5 g/dL (ref 3.6–5.1)
Alkaline phosphatase (APISO): 54 U/L (ref 37–153)
BUN: 16 mg/dL (ref 7–25)
CO2: 29 mmol/L (ref 20–32)
Calcium: 10.2 mg/dL (ref 8.6–10.4)
Chloride: 102 mmol/L (ref 98–110)
Creat: 0.82 mg/dL (ref 0.50–0.99)
GFR, Est African American: 86 mL/min/{1.73_m2} (ref >=60)
GFR, Est Non African American: 74 mL/min/{1.73_m2} (ref >=60)
Globulin: 2.8 g/dL (calc) (ref 1.9–3.7)
Glucose, Bld: 93 mg/dL (ref 65–99)
Potassium: 4.9 mmol/L (ref 3.5–5.3)
Sodium: 139 mmol/L (ref 135–146)
Total Bilirubin: 0.4 mg/dL (ref 0.2–1.2)
Total Protein: 7.3 g/dL (ref 6.1–8.1)

## 2018-10-19 ENCOUNTER — Ambulatory Visit (INDEPENDENT_AMBULATORY_CARE_PROVIDER_SITE_OTHER): Payer: Medicare Other

## 2018-10-19 ENCOUNTER — Other Ambulatory Visit: Payer: Self-pay

## 2018-10-19 DIAGNOSIS — Z23 Encounter for immunization: Secondary | ICD-10-CM | POA: Diagnosis not present

## 2018-11-08 ENCOUNTER — Encounter: Payer: Self-pay | Admitting: Emergency Medicine

## 2018-11-08 ENCOUNTER — Emergency Department
Admission: EM | Admit: 2018-11-08 | Discharge: 2018-11-08 | Disposition: A | Discharge status: Home / Self Care | Payer: Medicare Other | Attending: Emergency Medicine | Admitting: Emergency Medicine

## 2018-11-08 ENCOUNTER — Emergency Department: Payer: Medicare Other

## 2018-11-08 DIAGNOSIS — Y929 Unspecified place or not applicable: Secondary | ICD-10-CM | POA: Diagnosis not present

## 2018-11-08 DIAGNOSIS — N182 Chronic kidney disease, stage 2 (mild): Secondary | ICD-10-CM | POA: Insufficient documentation

## 2018-11-08 DIAGNOSIS — F17210 Nicotine dependence, cigarettes, uncomplicated: Secondary | ICD-10-CM | POA: Insufficient documentation

## 2018-11-08 DIAGNOSIS — X501XXA Overexertion from prolonged static or awkward postures, initial encounter: Secondary | ICD-10-CM | POA: Diagnosis not present

## 2018-11-08 DIAGNOSIS — Z79899 Other long term (current) drug therapy: Secondary | ICD-10-CM | POA: Insufficient documentation

## 2018-11-08 DIAGNOSIS — I129 Hypertensive chronic kidney disease with stage 1 through stage 4 chronic kidney disease, or unspecified chronic kidney disease: Secondary | ICD-10-CM | POA: Insufficient documentation

## 2018-11-08 DIAGNOSIS — Y939 Activity, unspecified: Secondary | ICD-10-CM | POA: Insufficient documentation

## 2018-11-08 DIAGNOSIS — S99922A Unspecified injury of left foot, initial encounter: Secondary | ICD-10-CM | POA: Diagnosis present

## 2018-11-08 DIAGNOSIS — Z7982 Long term (current) use of aspirin: Secondary | ICD-10-CM | POA: Diagnosis not present

## 2018-11-08 DIAGNOSIS — S93492A Sprain of other ligament of left ankle, initial encounter: Secondary | ICD-10-CM | POA: Diagnosis not present

## 2018-11-08 DIAGNOSIS — Y999 Unspecified external cause status: Secondary | ICD-10-CM | POA: Insufficient documentation

## 2018-11-08 MED ORDER — MELOXICAM 7.5 MG PO TABS: 7.5000 mg | ORAL_TABLET | Freq: Every day | ORAL | 0 refills | Status: DC

## 2018-11-08 NOTE — ED Triage Notes (Signed)
Pt stepped on uneven concrete today in yard and has had pain on the top of the left foot since. Pt has had previous break to same foot. No deformity noted.

## 2018-11-08 NOTE — ED Provider Notes (Signed)
[facility] Emergency Department Provider Note  ____________________________________________  Time seen: Approximately 8:16 PM  I have reviewed the triage vital signs and the nursing notes.   HISTORY  Chief Complaint Foot Injury    HPI Allison Phillips is a 68 y.o. female who presents the emergency department complaining of left sided ankle/foot pain.  Patient reports that she was working outside and stepped on an uneven piece of concrete rolling her ankle.  Patient is having pain to the lateral aspect of the foot and ankle since this injury.  She is concerned as she had a fracture 11 months ago to this foot.  No other injury or complaint.  Patient still able to bear weight on this foot.         Past Medical History:  Diagnosis Date  . Bursitis of left hip   . Depression   . Diverticulitis   . Hypertension   . Joint pain   . Obesity   . Osteopenia 04/20/2017   April 2019; next DEXA April 2021  . Osteoporosis   . Prediabetes   . Reflux   . Tobacco use     Patient Active Problem List   Diagnosis Date Noted  . Prediabetes 01/06/2018  . Obesity (BMI 35.0-39.9 without comorbidity) 01/06/2018  . CKD (chronic kidney disease) stage 2, GFR 60-89 ml/min 07/07/2017  . Osteopenia 04/20/2017  . Hip pain, chronic, left 03/18/2017  . Lower back pain 03/18/2017  . Sacroiliac joint pain 03/18/2017  . Screening for osteoporosis 12/30/2016  . Preventative health care 12/07/2016  . Encounter for hepatitis C screening test for low risk patient 12/06/2016  . Essential hypertension, benign 03/08/2016  . Cervical pain (neck) 12/31/2014  . Lipoma of skin and subcutaneous tissue of neck 08/07/2014  . Reflux   . Depression   . Bursitis of left hip     Past Surgical History:  Procedure Laterality Date  . ANAL FISSURE REPAIR    . APPENDECTOMY    . [place]  . COLONOSCOPY WITH PROPOFOL N/A 12/18/2014   Procedure: COLONOSCOPY WITH PROPOFOL;   Surgeon: Christene Lye, MD;  Location: [facility] ENDOSCOPY;  Service: Endoscopy;  Laterality: N/A;  . COLONOSCOPY WITH PROPOFOL N/A 02/17/2018   Procedure: COLONOSCOPY WITH PROPOFOL;  Surgeon: Virgel Manifold, MD;  Location: [facility] ENDOSCOPY;  Service: Endoscopy;  Laterality: N/A;  . LIPOMA EXCISION  07/2015   right shoulder  . LIPOMA EXCISION  11/19/2015  . OOPHORECTOMY     for "fibroids" not sure which ovary    Prior to Admission medications   Medication Sig Start Date End Date Taking? Authorizing Provider  aspirin EC 81 MG tablet Take 81 mg by mouth daily.    [provider]  Cholecalciferol (VITAMIN D3) 20 MCG TABS Take 25 mcg by mouth daily.    [provider]  Cyanocobalamin (B-12) 500 MCG TABS Take 2,500 mcg by mouth daily.     [provider]  Echinacea 125 MG CAPS Take 400 mg as needed by mouth.     [provider]  meloxicam (MOBIC) 7.5 MG tablet Take 1 tablet (7.5 mg total) by mouth daily. 11/08/18 11/08/19  Cuthriell, Charline Bills, PA-C  Multiple Vitamin (MULTIVITAMIN) tablet Take 1 tablet by mouth daily.    [provider]  Omega-3 Fatty Acids (FISH OIL PO) Take 1,200 mg daily by mouth.     [provider]  triamcinolone cream (KENALOG) 0.1 % Apply 1 application topically 2 (two) times  daily. 08/28/18   Fredderick Severance, NP  Turmeric 500 MG CAPS Take 300 mg by mouth daily.     [provider]    Allergies Patient has no known allergies.  Family History  Problem Relation Age of Onset  . Hypertension Mother   . Kidney disease Mother   . Alzheimer's disease Mother   . Thyroid cancer Son   . Lung cancer Brother   . Rectal cancer Brother   . Breast cancer Neg Hx     Social History Social History   Tobacco Use  . Smoking status: Current Every Day Smoker    Packs/day: 0.50    Years: 15.00    Pack years: 7.50    Types: Cigarettes    Last attempt to quit: 10/24/2016    Years since quitting: 2.0  .  Smokeless tobacco: Never Used  Substance Use Topics  . Alcohol use: Yes    Alcohol/week: 4.0 standard drinks    Types: 4 Standard drinks or equivalent per week    Comment: on occasion  . Drug use: No     Review of Systems  Constitutional: No fever/chills Eyes: No visual changes. No discharge ENT: No upper respiratory complaints. Cardiovascular: no chest pain. Respiratory: no cough. No SOB. Gastrointestinal: No abdominal pain.  No nausea, no vomiting.  Musculoskeletal: Negative for musculoskeletal pain. Skin: Negative for rash, abrasions, lacerations, ecchymosis. Neurological: Negative for headaches, focal weakness or numbness. 10-point ROS otherwise negative.  ____________________________________________   PHYSICAL EXAM:  VITAL SIGNS: ED Triage Vitals [11/08/18 1914]  Enc Vitals Group     BP 138/80     Pulse Rate 79     Resp 17     Temp 98.8 F (37.1 C)     Temp Source Oral     SpO2 99 %     Weight      Height      Head Circumference      Peak Flow      Pain Score      Pain Loc      Pain Edu?      Excl. in [place]?      Constitutional: Alert and oriented. Well appearing and in no acute distress. Eyes: Conjunctivae are normal. PERRL. EOMI. Head: Atraumatic. ENT:      Ears:       Nose: No congestion/rhinnorhea.      Mouth/Throat: Mucous membranes are moist.  Neck: No stridor.    Cardiovascular: Normal rate, regular rhythm. Normal S1 and S2.  Good peripheral circulation. Respiratory: Normal respiratory effort without tachypnea or retractions. Lungs CTAB. Good air entry to the bases with no decreased or absent breath sounds. Musculoskeletal: Full range of motion to all extremities. No gross deformities appreciated.  Visualization of the left foot and ankle reveals no gross edema, erythema, ecchymosis or lacerations.  Patient has slightly limited range of motion due to pain.  Patient is tender to palpation along the anterior talonavicular joint line as well as over the  fifth metatarsal.  No palpable abnormality or deficits in this region.  Dorsalis pedis pulse intact.  Sensation intact all 5 digits. Neurologic:  Normal speech and language. No gross focal neurologic deficits are appreciated.  Skin:  Skin is warm, dry and intact. No rash noted. Psychiatric: Mood and affect are normal. Speech and behavior are normal. Patient exhibits appropriate insight and judgement.   ____________________________________________   LABS (all labs ordered are listed, but only abnormal results are displayed)  Labs Reviewed -  No data to display ____________________________________________  EKG   ____________________________________________  RADIOLOGY I personally viewed and evaluated these images as part of my medical decision making, as well as reviewing the written report by the radiologist.  Dg Foot Complete Left  Result Date: 11/08/2018 CLINICAL DATA:  Dorsal foot pain after an injury in the yard today. EXAM: LEFT FOOT - COMPLETE 3+ VIEW COMPARISON:  02/08/2018 FINDINGS: There is no acute fracture or dislocation. There are healed fractures of the shafts of the fourth and fifth metatarsals. Bunion formation on the head of the first metatarsal with slight arthritis of the first MTP joint. IMPRESSION: 1. No acute abnormality. 2. Healed fractures of the fourth and fifth metatarsals. 3. Arthritic changes of the first MTP joint. Electronically Signed   By: Lorriane Shire M.D.   On: 11/08/2018 19:35    ____________________________________________    PROCEDURES  Procedure(s) performed:    Procedures    Medications - No data to display   ____________________________________________   INITIAL IMPRESSION / ASSESSMENT AND PLAN / ED COURSE  Pertinent labs & imaging results that were available during my care of the patient were reviewed by me and considered in my medical decision making (see chart for details).  Review of the [place] CSRS was performed in accordance  of the [place] prior to dispensing any controlled drugs.           Patient's diagnosis is consistent with left ankle sprain.  Patient presented to the emergency department complaining of left ankle pain after rolling her ankle when an uneven piece of concrete.  Patient was concerned that she had had a fracture to the left foot 11 months ago.  Imaging is reassuring with no indication of fractures.  Findings and history are most consistent with ankle sprain.  Patient is to use a lace up ASO stirrup ankle brace for improvement of symptoms.  Patient was placed on low-dose meloxicam.  Patient has a reported medical history of chronic kidney disease but her creatinine has not been higher than 0.9 and her GFR has been in the 70s over the past year.  A short course of low-dose meloxicam should improve patient's symptoms.  Follow-up with primary care or orthopedics..  Patient is given ED precautions to return to the ED for any worsening or new symptoms.     ____________________________________________  FINAL CLINICAL IMPRESSION(S) / ED DIAGNOSES  Final diagnoses:  Sprain of anterior talofibular ligament of left ankle, initial encounter      NEW MEDICATIONS STARTED DURING THIS VISIT:  ED Discharge Orders         Ordered    meloxicam (MOBIC) 7.5 MG tablet  Daily     11/08/18 2029              This chart was dictated using voice recognition software/Dragon. Despite best efforts to proofread, errors can occur which can change the meaning. Any change was purely unintentional.    Darletta Moll, PA-C 11/08/18 2032    Delman Kitten, MD 11/08/18 2125

## 2018-11-08 NOTE — Discharge Instructions (Addendum)
Acquire an ASO lace up stirrup ankle brace

## 2018-11-08 NOTE — ED Notes (Signed)
Patient transported to X-ray 

## 2018-11-17 ENCOUNTER — Other Ambulatory Visit: Payer: Self-pay

## 2018-11-17 ENCOUNTER — Encounter: Payer: Self-pay | Admitting: Family Medicine

## 2018-11-17 ENCOUNTER — Ambulatory Visit (INDEPENDENT_AMBULATORY_CARE_PROVIDER_SITE_OTHER): Payer: Medicare Other | Admitting: Family Medicine

## 2018-11-17 ENCOUNTER — Encounter: Payer: BC Managed Care – PPO | Admitting: Family Medicine

## 2018-11-17 VITALS — BP 122/82 | HR 62 | Temp 97.9°F | Resp 14 | Ht 64.0 in | Wt 208.7 lb

## 2018-11-17 DIAGNOSIS — R634 Abnormal weight loss: Secondary | ICD-10-CM | POA: Diagnosis not present

## 2018-11-17 DIAGNOSIS — R042 Hemoptysis: Secondary | ICD-10-CM | POA: Diagnosis not present

## 2018-11-17 DIAGNOSIS — M546 Pain in thoracic spine: Secondary | ICD-10-CM | POA: Diagnosis not present

## 2018-11-17 DIAGNOSIS — Z720 Tobacco use: Secondary | ICD-10-CM

## 2018-11-17 DIAGNOSIS — J310 Chronic rhinitis: Secondary | ICD-10-CM | POA: Diagnosis not present

## 2018-11-17 DIAGNOSIS — J329 Chronic sinusitis, unspecified: Secondary | ICD-10-CM

## 2018-11-17 LAB — POCT URINALYSIS DIPSTICK
Bilirubin, UA: NEGATIVE
Blood, UA: NEGATIVE
Glucose, UA: NEGATIVE
Ketones, UA: NEGATIVE
Leukocytes, UA: NEGATIVE
Nitrite, UA: NEGATIVE
Odor: NORMAL
Protein, UA: NEGATIVE
Spec Grav, UA: 1.015 (ref 1.010–1.025)
Urobilinogen, UA: 0.2 E.U./dL
pH, UA: 5.5 (ref 5.0–8.0)

## 2018-11-17 MED ORDER — FLUTICASONE PROPIONATE 50 MCG/ACT NA SUSP: 2.0000 | Freq: Every day | NASAL | 6 refills | Status: DC

## 2018-11-17 MED ORDER — LEVOCETIRIZINE DIHYDROCHLORIDE 5 MG PO TABS: 5.0000 mg | ORAL_TABLET | Freq: Every evening | ORAL | 2 refills | Status: DC

## 2018-11-17 NOTE — Progress Notes (Signed)
Patient ID: Allison Phillips, female    DOB: 1950-06-09, 68 y.o.   MRN: [id number]  PCP: Delsa Grana, PA-C  Chief Complaint  Patient presents with  . Back Pain    middle right back onset 1 month. Off and on stabbing pain  . Bleeding/Bruising    Pt states only happens in am after brushing teeth, does not think coming from gums? Concered has lost 5pds    Subjective:   Allison Phillips is a 68 y.o. female, presents to clinic with CC of the following:  HPI  Some blood streaks of blood in the morning when she brushes her teeth and spits in the am, none in the evening when brushing teeth.  Pt has had no coughing, spontaneous bruising, hematuria, melena, hematochezia.  She is not having any pleuritic chest pain, coughing, wheezing, shortness of breath.  She denies any GERD symptoms, denies any allergy symptoms but states that she is snoring at night.  She is concerned about cancer because of the blood she does have a upcoming appointment with her dentist.  She is a past smoker that she started smoking again only but 1/4 to 1/2 pack/day.  She is concerned because of some recent weight loss and because 2 of her brothers have cancer 1 has colon cancer 1 has lung cancer.   Also concerned about some back pain under her left shoulder blade she's had for several months and it seems like its moved across her back.  She has been seen for this previously, in August she did see Benjamine Mola a chest x-ray was normal and she was given Mobic at that time.  It is reproducible with palpation and with movement.  Weight loss -  She reports 5 pound weight loss in the last couple months but she has cut out soda Wt Readings from Last 5 Encounters:  11/17/18 208 lb 11.2 oz (94.7 kg)  07/13/18 218 lb (98.9 kg)  06/05/18 218 lb (98.9 kg)  02/17/18 218 lb (98.9 kg)  01/06/18 220 lb 8 oz (100 kg)   BMI Readings from Last 5 Encounters:  11/17/18 35.82 kg/m  08/28/18 36.28 kg/m  07/13/18 36.28 kg/m  06/05/18  36.28 kg/m  02/17/18 34.14 kg/m   Cut out sodas, and working on diet and loosing weight, concerned its more than it should be  No inspiratory CP, no wheeze, sob, coughing,  No spontaneous bruising or other bleeding  Labs were checked in August as well she has normal CBC, normal platelets, some prediabetes that has been improving.  Patient Active Problem List   Diagnosis Date Noted  . Prediabetes 01/06/2018  . Obesity (BMI 35.0-39.9 without comorbidity) 01/06/2018  . CKD (chronic kidney disease) stage 2, GFR 60-89 ml/min 07/07/2017  . Osteopenia 04/20/2017  . Hip pain, chronic, left 03/18/2017  . Lower back pain 03/18/2017  . Sacroiliac joint pain 03/18/2017  . Screening for osteoporosis 12/30/2016  . Preventative health care 12/07/2016  . Encounter for hepatitis C screening test for low risk patient 12/06/2016  . Essential hypertension, benign 03/08/2016  . Cervical pain (neck) 12/31/2014  . Lipoma of skin and subcutaneous tissue of neck 08/07/2014  . Reflux   . Depression   . Bursitis of left hip       Current Outpatient Medications:  .  aspirin EC 81 MG tablet, Take 81 mg by mouth daily., Disp: , Rfl:  .  Cholecalciferol (VITAMIN D3) 20 MCG TABS, Take 25 mcg by mouth daily., Disp: , Rfl:  .  Cyanocobalamin (B-12) 500 MCG TABS, Take 2,500 mcg by mouth daily. , Disp: , Rfl:  .  Echinacea 125 MG CAPS, Take 400 mg as needed by mouth. , Disp: , Rfl:  .  meloxicam (MOBIC) 7.5 MG tablet, Take 1 tablet (7.5 mg total) by mouth daily., Disp: 30 tablet, Rfl: 0 .  Multiple Vitamin (MULTIVITAMIN) tablet, Take 1 tablet by mouth daily., Disp: , Rfl:  .  Omega-3 Fatty Acids (FISH OIL PO), Take 1,200 mg daily by mouth. , Disp: , Rfl:  .  triamcinolone cream (KENALOG) 0.1 %, Apply 1 application topically 2 (two) times daily., Disp: 30 g, Rfl: 0 .  Turmeric 500 MG CAPS, Take 300 mg by mouth daily. , Disp: , Rfl:    No Known Allergies   Family History  Problem Relation Age of Onset   . Hypertension Mother   . Kidney disease Mother   . Alzheimer's disease Mother   . Thyroid cancer Son   . Lung cancer Brother   . Rectal cancer Brother   . Breast cancer Neg Hx      Social History   Socioeconomic History  . Marital status: Married    Spouse name: Nadara Mustard  . Number of children: 1  . Years of education: Not on file  . Highest education level: Bachelor's degree (e.g., BA, AB, BS)  Occupational History  . Occupation: Retired  Scientific laboratory technician  . Financial resource strain: Not hard at all  . Food insecurity    Worry: Never true    Inability: Never true  . Transportation needs    Medical: No    Non-medical: No  Tobacco Use  . Smoking status: Current Every Day Smoker    Packs/day: 0.50    Years: 15.00    Pack years: 7.50    Types: Cigarettes    Last attempt to quit: 10/24/2016    Years since quitting: 2.0  . Smokeless tobacco: Never Used  Substance and Sexual Activity  . Alcohol use: Yes    Alcohol/week: 4.0 standard drinks    Types: 4 Standard drinks or equivalent per week    Comment: on occasion  . Drug use: No  . Sexual activity: Yes    Partners: Male  Lifestyle  . Physical activity    Days per week: 0 days    Minutes per session: 0 min  . Stress: Only a little  Relationships  . Social connections    Talks on phone: More than three times a week    Gets together: Twice a week    Attends religious service: More than 4 times per year    Active member of club or organization: Yes    Attends meetings of clubs or organizations: More than 4 times per year    Relationship status: Married  . Intimate partner violence    Fear of current or ex partner: No    Emotionally abused: No    Physically abused: No    Forced sexual activity: No  Other Topics Concern  . Not on file  Social History Narrative  . Not on file    I personally reviewed active problem list, medication list, allergies, family history, social history, health maintenance, notes from last  encounter, notes from last several encounters, imaging with the patient/caregiver today.  Review of Systems  Constitutional: Negative.   HENT: Negative.   Eyes: Negative.   Respiratory: Negative.   Cardiovascular: Negative.   Gastrointestinal: Negative.   Endocrine: Negative.   Genitourinary: Negative.  Musculoskeletal: Negative.   Skin: Negative.   Allergic/Immunologic: Negative.   Neurological: Negative.   Hematological: Negative.   Psychiatric/Behavioral: Negative.   All other systems reviewed and are negative.      Objective:   Vitals:   11/17/18 1504  BP: 122/82  Pulse: 62  Resp: 14  Temp: 97.9 F (36.6 C)  SpO2: 97%  Weight: 208 lb 11.2 oz (94.7 kg)  Height: 5\' 4"  (1.626 m)    Body mass index is 35.82 kg/m.  Physical Exam Vitals signs and nursing note reviewed.  Constitutional:      General: She is not in acute distress.    Appearance: Normal appearance. She is well-developed. She is obese. She is not ill-appearing, toxic-appearing or diaphoretic.     Interventions: She is not intubated.Face mask in place.  HENT:     Head: Normocephalic and atraumatic.     Right Ear: Hearing, tympanic membrane, ear canal and external ear normal.     Left Ear: Hearing, tympanic membrane, ear canal and external ear normal.     Nose: Mucosal edema, congestion and rhinorrhea present.     Right Sinus: No maxillary sinus tenderness or frontal sinus tenderness.     Left Sinus: No maxillary sinus tenderness or frontal sinus tenderness.     Comments: Right knee are very edematous and very raw and erythematous, left nasal mucosa is mildly edematous and pink with some congestion  Posterior oropharynx is mildly injected    Mouth/Throat:     Mouth: Mucous membranes are moist. Mucous membranes are not pale.     Pharynx: Uvula midline. Posterior oropharyngeal erythema present. No oropharyngeal exudate or uvula swelling.     Tonsils: No tonsillar abscesses.  Eyes:     General: Lids  are normal. No scleral icterus.       Right eye: No discharge.        Left eye: No discharge.     Conjunctiva/sclera: Conjunctivae normal.     Pupils: Pupils are equal, round, and reactive to light.  Neck:     Musculoskeletal: Normal range of motion and neck supple.     Trachea: Phonation normal. No tracheal deviation.  Cardiovascular:     Rate and Rhythm: Normal rate and regular rhythm.     Pulses: Normal pulses.          Radial pulses are 2+ on the right side and 2+ on the left side.       Posterior tibial pulses are 2+ on the right side and 2+ on the left side.     Heart sounds: Normal heart sounds. No murmur. No friction rub. No gallop.   Pulmonary:     Effort: Pulmonary effort is normal. No tachypnea, accessory muscle usage, prolonged expiration, respiratory distress or retractions. She is not intubated.     Breath sounds: Normal breath sounds. No stridor, decreased air movement or transmitted upper airway sounds. No decreased breath sounds, wheezing, rhonchi or rales.       Comments: Posterior thoracic right-sided ribs tender to palpation Chest:     Chest wall: Tenderness present. No mass, swelling, crepitus or edema. There is no dullness to percussion.     Breasts: Breasts are symmetrical.   Abdominal:     General: Bowel sounds are normal. There is no distension.     Palpations: Abdomen is soft.     Tenderness: There is no abdominal tenderness. There is no guarding or rebound.  Musculoskeletal: Normal range of motion.  General: No deformity.     Right lower leg: No edema.     Left lower leg: No edema.  Lymphadenopathy:     Cervical: No cervical adenopathy.     Upper Body:     Right upper body: No supraclavicular, axillary or pectoral adenopathy.     Left upper body: No supraclavicular, axillary or pectoral adenopathy.  Skin:    General: Skin is warm and dry.     Capillary Refill: Capillary refill takes less than 2 seconds.     Coloration: Skin is not jaundiced or  pale.     Findings: No rash.  Neurological:     Mental Status: She is alert and oriented to person, place, and time.     Motor: No abnormal muscle tone.     Coordination: Coordination normal.     Gait: Gait normal.  Psychiatric:        Speech: Speech normal.        Behavior: Behavior normal.      Results for orders placed or performed in visit on 11/17/18  POCT urinalysis dipstick  Result Value Ref Range   Color, UA yellow    Clarity, UA clr    Glucose, UA Negative Negative   Bilirubin, UA neg    Ketones, UA neg    Spec Grav, UA 1.015 1.010 - 1.025   Blood, UA neg    pH, UA 5.5 5.0 - 8.0   Protein, UA Negative Negative   Urobilinogen, UA 0.2 0.2 or 1.0 E.U./dL   Nitrite, UA neg    Leukocytes, UA Negative Negative   Appearance clear    Odor normal         Assessment & Plan:      ICD-10-CM   1. Rhinosinusitis  J31.0    J32.9    very erythematous nasal mucosa and turbinate, particularly on right side, suspect that is source of some blood  2. Spitting up blood  R04.2    when brushing teeth and rinsing in the morning - no blood when brushing teeth at night, suspect from postnasal drip and rhinosinusitis  3. Acute right-sided thoracic back pain  M54.6 POCT urinalysis dipstick   msk, continue mobic as needed  4. Weight loss  R63.4    working on diet and cut out soda, not concerned at this point for malignancy causing weight loss  5. Tobacco abuse  Z72.0     Encourage patient to work on smoking cessation to decrease her risk was offered resources today.  In the future me possibly see if she is eligible for a low-dose CT scanning screening for lung cancer.  Her last chest x-ray was reviewed by me also her labs, they are all very reassuring, her back pain does not seem related to kidney at all it does seem muscle skeletal, lungs are clear.  We had her do ambulatory pulse oximetry before leaving and she maintained oxygen saturation at above 90% without any dyspnea  Plan is to  have her follow-up in 1 month and reassess her nasal mucosa listen to her lungs again check on her symptoms I highly doubt any coagulopathy or concern for malignancy at this point in time but will follow her a little bit closely to monitor weight and other sx.  Right now no concerning findings on exam, no red flags, which she is spitting up does not sound like hemoptysis at all, and I suspect her weight loss is from her dietary changes  Delsa Grana, PA-C 11/17/18  3:14 PM

## 2018-12-05 ENCOUNTER — Other Ambulatory Visit: Payer: Self-pay

## 2018-12-05 MED ORDER — MELOXICAM 7.5 MG PO TABS: 7.5000 mg | ORAL_TABLET | Freq: Every day | ORAL | 0 refills | Status: DC

## 2018-12-06 ENCOUNTER — Other Ambulatory Visit: Payer: Self-pay | Admitting: Family Medicine

## 2018-12-06 MED ORDER — MELOXICAM 7.5 MG PO TABS: 7.5000 mg | ORAL_TABLET | Freq: Every day | ORAL | 0 refills | Status: DC

## 2018-12-18 ENCOUNTER — Other Ambulatory Visit: Payer: Self-pay

## 2018-12-18 ENCOUNTER — Ambulatory Visit (INDEPENDENT_AMBULATORY_CARE_PROVIDER_SITE_OTHER): Payer: Medicare Other | Admitting: Family Medicine

## 2018-12-18 ENCOUNTER — Encounter: Payer: Self-pay | Admitting: Family Medicine

## 2018-12-18 VITALS — BP 128/72 | HR 68 | Temp 97.8°F | Resp 14 | Ht 64.0 in | Wt 208.0 lb

## 2018-12-18 DIAGNOSIS — M8580 Other specified disorders of bone density and structure, unspecified site: Secondary | ICD-10-CM

## 2018-12-18 DIAGNOSIS — Z5181 Encounter for therapeutic drug level monitoring: Secondary | ICD-10-CM

## 2018-12-18 DIAGNOSIS — M5441 Lumbago with sciatica, right side: Secondary | ICD-10-CM

## 2018-12-18 DIAGNOSIS — M25551 Pain in right hip: Secondary | ICD-10-CM

## 2018-12-18 DIAGNOSIS — R042 Hemoptysis: Secondary | ICD-10-CM | POA: Diagnosis not present

## 2018-12-18 DIAGNOSIS — R0683 Snoring: Secondary | ICD-10-CM | POA: Diagnosis not present

## 2018-12-18 DIAGNOSIS — E669 Obesity, unspecified: Secondary | ICD-10-CM

## 2018-12-18 DIAGNOSIS — J310 Chronic rhinitis: Secondary | ICD-10-CM

## 2018-12-18 DIAGNOSIS — Z720 Tobacco use: Secondary | ICD-10-CM

## 2018-12-18 DIAGNOSIS — R634 Abnormal weight loss: Secondary | ICD-10-CM

## 2018-12-18 DIAGNOSIS — I10 Essential (primary) hypertension: Secondary | ICD-10-CM

## 2018-12-18 DIAGNOSIS — R7303 Prediabetes: Secondary | ICD-10-CM

## 2018-12-18 DIAGNOSIS — J329 Chronic sinusitis, unspecified: Secondary | ICD-10-CM

## 2018-12-18 MED ORDER — PANTOPRAZOLE SODIUM 20 MG PO TBEC: 20.0000 mg | DELAYED_RELEASE_TABLET | ORAL | 0 refills | Status: DC

## 2018-12-18 NOTE — Patient Instructions (Signed)
Try protonix for two weeks to see if it helps with any of your morning symptoms.  Add zyrtec claritin or allegra to your flonase to see if that helps with morning symptoms or snoring.

## 2018-12-18 NOTE — Progress Notes (Signed)
Patient ID: Allison Phillips, female    DOB: 16-Dec-1950, 68 y.o.   MRN: [id number]  PCP: Delsa Grana, PA-C  Chief Complaint  Patient presents with  . Follow-up  . Obesity  . Bleeding/Bruising    in mouth/throat went to dentisit, no problems    Subjective:   Allison Phillips is a 68 y.o. female, presents to clinic with CC of the following:  HPI  Patient returns for follow-up on some blood tinges in her spit after brushing her teeth in the morning.  Her symptoms when she presented about a month ago or not very concerning.  She did start Protonix and medicine for rhinitis and was going to follow-up with her dentist because the blood tinge only occurred after brushing her teeth and no other times per day.  She states today that she did see her dentist and they did not see anything abnormal.  She continues to have no other abnormal bleeding or bruising. Follow up on post nasal drip and morning coughing.  Patient has had only little improvement in her postnasal drip and morning cough but she is not having any shortness of breath, hematemesis, chest pain, wheeze, night sweats or any weight loss.  She was very concerned about unintentional weight loss and today states that she is continued to lose weight and she is very anxious about it being malignancy or something life-threatening.  I reviewed her weights today in the chart she is the same weight she was 1 month ago and she has not lost any more weight since earlier this year.  Of note that she also did start smoking again this year and had previously quit.   Wt Readings from Last 5 Encounters:  12/18/18 208 lb (94.3 kg)  11/17/18 208 lb 11.2 oz (94.7 kg)  07/13/18 218 lb (98.9 kg)  06/05/18 218 lb (98.9 kg)  02/17/18 218 lb (98.9 kg)   1/2 ppd, recently restarted smoking March 2020 Smoked previously [age] y/o, maybe 18 years total smoking, always 1/2 ppd  She continues to complain about pain in her back just medial to her right scapula  she is complaining of is off and on for several months x-rays have been negative in the past, she has good range of motion and strength.      Patient Active Problem List   Diagnosis Date Noted  . Prediabetes 01/06/2018  . Obesity (BMI 35.0-39.9 without comorbidity) 01/06/2018  . CKD (chronic kidney disease) stage 2, GFR 60-89 ml/min 07/07/2017  . Osteopenia 04/20/2017  . Hip pain, chronic, left 03/18/2017  . Lower back pain 03/18/2017  . Sacroiliac joint pain 03/18/2017  . Screening for osteoporosis 12/30/2016  . Preventative health care 12/07/2016  . Encounter for hepatitis C screening test for low risk patient 12/06/2016  . Essential hypertension, benign 03/08/2016  . Cervical pain (neck) 12/31/2014  . Lipoma of skin and subcutaneous tissue of neck 08/07/2014  . Reflux   . Depression   . Bursitis of left hip       Current Outpatient Medications:  .  aspirin EC 81 MG tablet, Take 81 mg by mouth daily., Disp: , Rfl:  .  Cholecalciferol (VITAMIN D3) 20 MCG TABS, Take 25 mcg by mouth daily., Disp: , Rfl:  .  Cyanocobalamin (B-12) 500 MCG TABS, Take 2,500 mcg by mouth daily. , Disp: , Rfl:  .  Echinacea 125 MG CAPS, Take 400 mg as needed by mouth. , Disp: , Rfl:  .  fluticasone (FLONASE)  50 MCG/ACT nasal spray, Place 2 sprays into both nostrils daily., Disp: 16 g, Rfl: 6 .  meloxicam (MOBIC) 7.5 MG tablet, Take 1 tablet (7.5 mg total) by mouth daily., Disp: 30 tablet, Rfl: 0 .  Multiple Vitamin (MULTIVITAMIN) tablet, Take 1 tablet by mouth daily., Disp: , Rfl:  .  Omega-3 Fatty Acids (FISH OIL PO), Take 1,200 mg daily by mouth. , Disp: , Rfl:  .  Turmeric 500 MG CAPS, Take 300 mg by mouth daily. , Disp: , Rfl:    No Known Allergies   Family History  Problem Relation Age of Onset  . Hypertension Mother   . Kidney disease Mother   . Alzheimer's disease Mother   . Thyroid cancer Son   . Lung cancer Brother   . Rectal cancer Brother   . Breast cancer Neg Hx      Social  History   Socioeconomic History  . Marital status: Married    Spouse name: Nadara Mustard  . Number of children: 1  . Years of education: Not on file  . Highest education level: Bachelor's degree (e.g., BA, AB, BS)  Occupational History  . Occupation: Retired  Scientific laboratory technician  . Financial resource strain: Not hard at all  . Food insecurity    Worry: Never true    Inability: Never true  . Transportation needs    Medical: No    Non-medical: No  Tobacco Use  . Smoking status: Current Every Day Smoker    Packs/day: 0.50    Years: 15.00    Pack years: 7.50    Types: Cigarettes    Last attempt to quit: 10/24/2016    Years since quitting: 2.1  . Smokeless tobacco: Never Used  Substance and Sexual Activity  . Alcohol use: Yes    Alcohol/week: 4.0 standard drinks    Types: 4 Standard drinks or equivalent per week    Comment: on occasion  . Drug use: No  . Sexual activity: Yes    Partners: Male  Lifestyle  . Physical activity    Days per week: 0 days    Minutes per session: 0 min  . Stress: Only a little  Relationships  . Social connections    Talks on phone: More than three times a week    Gets together: Twice a week    Attends religious service: More than 4 times per year    Active member of club or organization: Yes    Attends meetings of clubs or organizations: More than 4 times per year    Relationship status: Married  . Intimate partner violence    Fear of current or ex partner: No    Emotionally abused: No    Physically abused: No    Forced sexual activity: No  Other Topics Concern  . Not on file  Social History Narrative  . Not on file    I personally reviewed active problem list, medication list, allergies, family history, social history, health maintenance, notes from last encounter, lab results, imaging with the patient/caregiver today.  Review of Systems  Constitutional: Negative.   HENT: Negative.   Eyes: Negative.   Respiratory: Negative.   Cardiovascular:  Negative.   Gastrointestinal: Negative.   Endocrine: Negative.   Genitourinary: Negative.   Musculoskeletal: Negative.   Skin: Negative.   Allergic/Immunologic: Negative.   Neurological: Negative.   Hematological: Negative.   Psychiatric/Behavioral: Negative.   All other systems reviewed and are negative.      Objective:   Vitals:  12/18/18 0824  BP: 128/72  Pulse: 68  Resp: 14  Temp: 97.8 F (36.6 C)  SpO2: 97%  Weight: 208 lb (94.3 kg)  Height: 5\' 4"  (1.626 m)    Body mass index is 35.7 kg/m.  Physical Exam Vitals signs and nursing note reviewed.  Constitutional:      General: She is not in acute distress.    Appearance: Normal appearance. She is well-developed. She is obese. She is not ill-appearing, toxic-appearing or diaphoretic.  HENT:     Head: Normocephalic and atraumatic.     Right Ear: Hearing, tympanic membrane, ear canal and external ear normal.     Left Ear: Hearing, tympanic membrane, ear canal and external ear normal.     Nose: Mucosal edema, congestion and rhinorrhea present. No nasal deformity or septal deviation.     Right Turbinates: Enlarged.     Left Turbinates: Enlarged.     Right Sinus: No maxillary sinus tenderness or frontal sinus tenderness.     Left Sinus: No maxillary sinus tenderness or frontal sinus tenderness.     Mouth/Throat:     Mouth: Mucous membranes are moist. Mucous membranes are not pale.     Pharynx: Uvula midline. Posterior oropharyngeal erythema (mild op ) present. No oropharyngeal exudate or uvula swelling.     Tonsils: No tonsillar abscesses.  Eyes:     General:        Right eye: No discharge.        Left eye: No discharge.     Conjunctiva/sclera: Conjunctivae normal.     Pupils: Pupils are equal, round, and reactive to light.  Neck:     Musculoskeletal: Normal range of motion and neck supple.     Trachea: No tracheal deviation.  Cardiovascular:     Rate and Rhythm: Normal rate and regular rhythm.     Pulses:  Normal pulses.     Heart sounds: Normal heart sounds. No murmur. No friction rub. No gallop.   Pulmonary:     Effort: Pulmonary effort is normal. No tachypnea, accessory muscle usage, prolonged expiration or respiratory distress.     Breath sounds: Normal breath sounds and air entry. No stridor, decreased air movement or transmitted upper airway sounds. No decreased breath sounds, wheezing, rhonchi or rales.  Abdominal:     General: Bowel sounds are normal. There is no distension.     Palpations: Abdomen is soft.  Musculoskeletal: Normal range of motion.     Right shoulder: Normal. She exhibits normal range of motion, no tenderness and no bony tenderness.     Right hip: Normal. She exhibits normal range of motion, normal strength, no tenderness, no bony tenderness and no crepitus.     Cervical back: Normal.     Thoracic back: Normal.     Lumbar back: She exhibits normal range of motion, no tenderness, no bony tenderness, no swelling and no edema.       Back:     Comments: Mild right rhomboid area ttp Mildly + SLR on right side, neg to left  Skin:    General: Skin is warm and dry.     Capillary Refill: Capillary refill takes less than 2 seconds.     Coloration: Skin is not pale.     Findings: No rash.  Neurological:     Mental Status: She is alert.     Motor: No weakness or abnormal muscle tone.     Coordination: Coordination normal.     Gait: Gait normal.  Psychiatric:        Attention and Perception: Attention normal.        Mood and Affect: Affect normal. Mood is anxious.        Speech: Speech normal.        Behavior: Behavior normal. Behavior is cooperative.      Results for orders placed or performed in visit on 11/17/18  POCT urinalysis dipstick  Result Value Ref Range   Color, UA yellow    Clarity, UA clr    Glucose, UA Negative Negative   Bilirubin, UA neg    Ketones, UA neg    Spec Grav, UA 1.015 1.010 - 1.025   Blood, UA neg    pH, UA 5.5 5.0 - 8.0   Protein,  UA Negative Negative   Urobilinogen, UA 0.2 0.2 or 1.0 E.U./dL   Nitrite, UA neg    Leukocytes, UA Negative Negative   Appearance clear    Odor normal         Assessment & Plan:    1. Rhinosinusitis She continues to complain of symptoms, did start Flonase but is not on an antihistamine even though I did prescribe Xyzal for her.  She was encouraged to do Flonase antihistamines and she does asked to see ENT, they may be able to assess her nose and throat and reassure her that there is no malignant source of bleeding.  Does appear to have some rhinitis and some irritation in her oropharynx likely from postnasal drip - Ambulatory referral to ENT  2. Weight loss Her weights have actually been stable I do think it is likely secondary to restarting smoking cigarettes but will recheck her labs just to be sure she has no diabetes no hyperthyroid or other abnormalities - CMP w GFR - CBC w/ Diff - A1C - TSH  3. Snoring Patient also mentions that she does snore she can follow-up with ENT about this - Ambulatory referral to ENT  4. Spitting up blood Unclear etiology has been very minor.  We did not do any labs last time she came for this so we will recheck her CBC we will continue Protonix, follow-up with ENT, dentist has evaluated her and there was no concerns or abnormalities related to her gums or dentition - pantoprazole (PROTONIX) 20 MG tablet; Take 1 tablet (20 mg total) by mouth every morning. One hour before breakfast  Dispense: 60 tablet; Refill: 0 - Ambulatory referral to ENT  5. Tobacco abuse Tobacco cessation counseling was given today patient was encouraged to quit and given options  6. Medication monitoring encounter - CMP w GFR - CBC w/ Diff - A1C - TSH  7. Essential hypertension, benign Blood pressures well controlled recheck labs - CMP w GFR  8. Osteopenia, unspecified location Patient briefly mentions having osteopenia having hip pain and wanting things rechecked  we will recheck her calcium with her basic chemistry today - CMP w GFR  9. Prediabetes Recheck labs with history of prediabetes - CMP w GFR - A1C  10. Obesity (BMI 35.0-39.9 without comorbidity) Patient did have some weight loss but weights have been stable over the past month - CMP w GFR - CBC w/ Diff - A1C - TSH  11. Right hip pain Patient states she previously had injections in her hip and/or back she is having some right hip pain that slightly uncomfortable she mentioned at the very end of her visit today, there was no tenderness to palpation no pain with internal/external rotation of her  hip, since she has had joint injections before we will refer her back to Ortho for further evaluation - Ambulatory referral to Orthopedics  12. Right-sided low back pain with right-sided sciatica, unspecified chronicity See above patient is slightly unclear if she has had some right low back pain sciatica or right hip pain currently she is got good range of motion of her back her hip, no decrease sensation or strength to her right lower extremity, some mildly positive straight leg raise on the right - Ambulatory referral to [place], PA-C 12/18/18 8:44 AM

## 2018-12-19 LAB — COMPLETE METABOLIC PANEL WITH GFR
AG Ratio: 1.6 (calc) (ref 1.0–2.5)
ALT: 9 U/L (ref 6–29)
AST: 12 U/L (ref 10–35)
Albumin: 4.3 g/dL (ref 3.6–5.1)
Alkaline phosphatase (APISO): 50 U/L (ref 37–153)
BUN: 14 mg/dL (ref 7–25)
CO2: 28 mmol/L (ref 20–32)
Calcium: 9.6 mg/dL (ref 8.6–10.4)
Chloride: 102 mmol/L (ref 98–110)
Creat: 0.77 mg/dL (ref 0.50–0.99)
GFR, Est African American: 93 mL/min/{1.73_m2} (ref >=60)
GFR, Est Non African American: 80 mL/min/{1.73_m2} (ref >=60)
Globulin: 2.7 g/dL (calc) (ref 1.9–3.7)
Glucose, Bld: 93 mg/dL (ref 65–99)
Potassium: 4.8 mmol/L (ref 3.5–5.3)
Sodium: 138 mmol/L (ref 135–146)
Total Bilirubin: 0.5 mg/dL (ref 0.2–1.2)
Total Protein: 7 g/dL (ref 6.1–8.1)

## 2018-12-19 LAB — CBC WITH DIFFERENTIAL/PLATELET
Absolute Monocytes: 462 cells/uL (ref 200–950)
Basophils Absolute: 114 cells/uL (ref 0–200)
Basophils Relative: 1.7 %
Eosinophils Absolute: 154 cells/uL (ref 15–500)
Eosinophils Relative: 2.3 %
HCT: 42.4 % (ref 35.0–45.0)
Hemoglobin: 14.1 g/dL (ref 11.7–15.5)
Lymphs Abs: 2613 cells/uL (ref 850–3900)
MCH: 28.7 pg (ref 27.0–33.0)
MCHC: 33.3 g/dL (ref 32.0–36.0)
MCV: 86.4 fL (ref 80.0–100.0)
MPV: 10.9 fL (ref 7.5–12.5)
Monocytes Relative: 6.9 %
Neutro Abs: 3357 cells/uL (ref 1500–7800)
Neutrophils Relative %: 50.1 %
Platelets: 319 10*3/uL (ref 140–400)
RBC: 4.91 10*6/uL (ref 3.80–5.10)
RDW: 13.5 % (ref 11.0–15.0)
Total Lymphocyte: 39 %
WBC: 6.7 10*3/uL (ref 3.8–10.8)

## 2018-12-19 LAB — HEMOGLOBIN A1C
Hgb A1c MFr Bld: 5.6 % of total Hgb (ref <5.7)
Mean Plasma Glucose: 114 (calc)
eAG (mmol/L): 6.3 (calc)

## 2018-12-19 LAB — TSH: TSH: 0.77 mIU/L (ref 0.40–4.50)

## 2019-01-01 ENCOUNTER — Other Ambulatory Visit: Payer: Self-pay | Admitting: Family Medicine

## 2019-01-01 DIAGNOSIS — Z1231 Encounter for screening mammogram for malignant neoplasm of breast: Secondary | ICD-10-CM

## 2019-01-08 ENCOUNTER — Other Ambulatory Visit: Payer: Self-pay

## 2019-01-08 ENCOUNTER — Emergency Department
Admission: EM | Admit: 2019-01-08 | Discharge: 2019-01-08 | Disposition: A | Discharge status: Home / Self Care | Payer: Medicare Other | Attending: Emergency Medicine | Admitting: Emergency Medicine

## 2019-01-08 ENCOUNTER — Encounter: Payer: Self-pay | Admitting: Medical Oncology

## 2019-01-08 ENCOUNTER — Emergency Department: Payer: Medicare Other

## 2019-01-08 DIAGNOSIS — Z20828 Contact with and (suspected) exposure to other viral communicable diseases: Secondary | ICD-10-CM | POA: Insufficient documentation

## 2019-01-08 DIAGNOSIS — R0981 Nasal congestion: Secondary | ICD-10-CM | POA: Diagnosis present

## 2019-01-08 DIAGNOSIS — F17210 Nicotine dependence, cigarettes, uncomplicated: Secondary | ICD-10-CM | POA: Diagnosis not present

## 2019-01-08 DIAGNOSIS — I129 Hypertensive chronic kidney disease with stage 1 through stage 4 chronic kidney disease, or unspecified chronic kidney disease: Secondary | ICD-10-CM | POA: Insufficient documentation

## 2019-01-08 DIAGNOSIS — Z7982 Long term (current) use of aspirin: Secondary | ICD-10-CM | POA: Insufficient documentation

## 2019-01-08 DIAGNOSIS — Z79899 Other long term (current) drug therapy: Secondary | ICD-10-CM | POA: Insufficient documentation

## 2019-01-08 DIAGNOSIS — N182 Chronic kidney disease, stage 2 (mild): Secondary | ICD-10-CM | POA: Insufficient documentation

## 2019-01-08 DIAGNOSIS — B349 Viral infection, unspecified: Secondary | ICD-10-CM | POA: Diagnosis not present

## 2019-01-08 LAB — SARS CORONAVIRUS 2 (TAT 6-24 HRS): SARS Coronavirus 2: NEGATIVE

## 2019-01-08 MED ORDER — ONDANSETRON 8 MG PO TBDP
8.0000 mg | ORAL_TABLET | Freq: Once | ORAL | Status: AC
  Administered 2019-01-08: 8 mg via ORAL
  Filled 2019-01-08: qty 1

## 2019-01-08 MED ORDER — KETOROLAC TROMETHAMINE 60 MG/2ML IM SOLN
30.0000 mg | Freq: Once | INTRAMUSCULAR | Status: AC
  Administered 2019-01-08: 30 mg via INTRAMUSCULAR
  Filled 2019-01-08: qty 2

## 2019-01-08 MED ORDER — KETOROLAC TROMETHAMINE 10 MG PO TABS: 10.0000 mg | ORAL_TABLET | Freq: Four times a day (QID) | ORAL | 0 refills | Status: DC | PRN

## 2019-01-08 MED ORDER — ONDANSETRON HCL 8 MG PO TABS: 8.0000 mg | ORAL_TABLET | Freq: Three times a day (TID) | ORAL | 0 refills | Status: DC | PRN

## 2019-01-08 MED ORDER — BENZONATATE 100 MG PO CAPS: 200.0000 mg | ORAL_CAPSULE | Freq: Three times a day (TID) | ORAL | 0 refills | Status: DC | PRN

## 2019-01-08 MED ORDER — TRAMADOL HCL 50 MG PO TABS
50.0000 mg | ORAL_TABLET | Freq: Once | ORAL | Status: AC
  Administered 2019-01-08: 09:00:00 50 mg via ORAL
  Filled 2019-01-08: qty 1

## 2019-01-08 MED ORDER — TRAMADOL HCL 50 MG PO TABS: 50.0000 mg | ORAL_TABLET | Freq: Four times a day (QID) | ORAL | 0 refills | Status: DC | PRN

## 2019-01-08 NOTE — ED Triage Notes (Signed)
Pt reports flu like sx's that began Saturday. Pt denies any know exposure to anyone sick.

## 2019-01-08 NOTE — ED Notes (Signed)
See triage note  Presents with low grade fever and body aches   States she developed sxs' on Saturday

## 2019-01-08 NOTE — Discharge Instructions (Addendum)
Follow discharge care instruction take medication as directed.  Advised self quarantine pending results of COVID-19 test.  Results can be found in the MyChart app.

## 2019-01-08 NOTE — ED Provider Notes (Signed)
[facility] Emergency Department Provider Note   ____________________________________________   First MD Initiated Contact with Patient 01/08/19 [phone]     (approximate)  I have reviewed the triage vital signs and the nursing notes.   HISTORY  Chief Complaint Generalized Body Aches, Headache, and flu like sx's    HPI Allison Phillips is a 68 y.o. female patient presents with flulike symptoms that began 2 days ago.  Patient denies any known exposure to COVID-19.  Patient denies recent travel.  Patient states nasal congestion, nausea, cough, fatigue, and body aches.  Patient has taken flu shot for this season.  Patient describes her pain as "achy".  Patient rates the pain as a 8/10.  No palliative measure for complaint.      Past Medical History:  Diagnosis Date  . Bursitis of left hip   . Depression   . Diverticulitis   . Hypertension   . Joint pain   . Obesity   . Osteopenia 04/20/2017   April 2019; next DEXA April 2021  . Osteoporosis   . Prediabetes   . Reflux   . Tobacco use     Patient Active Problem List   Diagnosis Date Noted  . Prediabetes 01/06/2018  . Obesity (BMI 35.0-39.9 without comorbidity) 01/06/2018  . CKD (chronic kidney disease) stage 2, GFR 60-89 ml/min 07/07/2017  . Osteopenia 04/20/2017  . Hip pain, chronic, left 03/18/2017  . Lower back pain 03/18/2017  . Sacroiliac joint pain 03/18/2017  . Screening for osteoporosis 12/30/2016  . Preventative health care 12/07/2016  . Encounter for hepatitis C screening test for low risk patient 12/06/2016  . Essential hypertension, benign 03/08/2016  . Cervical pain (neck) 12/31/2014  . Lipoma of skin and subcutaneous tissue of neck 08/07/2014  . Reflux   . Depression   . Bursitis of left hip     Past Surgical History:  Procedure Laterality Date  . ANAL FISSURE REPAIR    . APPENDECTOMY    . [place]  . COLONOSCOPY WITH PROPOFOL N/A 12/18/2014   Procedure:  COLONOSCOPY WITH PROPOFOL;  Surgeon: Christene Lye, MD;  Location: [facility] ENDOSCOPY;  Service: Endoscopy;  Laterality: N/A;  . COLONOSCOPY WITH PROPOFOL N/A 02/17/2018   Procedure: COLONOSCOPY WITH PROPOFOL;  Surgeon: Virgel Manifold, MD;  Location: [facility] ENDOSCOPY;  Service: Endoscopy;  Laterality: N/A;  . LIPOMA EXCISION  07/2015   right shoulder  . LIPOMA EXCISION  11/19/2015  . OOPHORECTOMY     for "fibroids" not sure which ovary    Prior to Admission medications   Medication Sig Start Date End Date Taking? Authorizing Provider  aspirin EC 81 MG tablet Take 81 mg by mouth daily.    [provider]  benzonatate (TESSALON PERLES) 100 MG capsule Take 2 capsules (200 mg total) by mouth 3 (three) times daily as needed. 01/08/19 01/08/20  Sable Feil, PA-C  Cholecalciferol (VITAMIN D3) 20 MCG TABS Take 25 mcg by mouth daily.    [provider]  Cyanocobalamin (B-12) 500 MCG TABS Take 2,500 mcg by mouth daily.     [provider]  Echinacea 125 MG CAPS Take 400 mg as needed by mouth.     [provider]  fluticasone (FLONASE) 50 MCG/ACT nasal spray Place 2 sprays into both nostrils daily. 11/17/18   Delsa Grana, PA-C  ketorolac (TORADOL) 10 MG tablet Take 1 tablet (10 mg total) by mouth every 6 (six) hours as needed. 01/08/19   Tamala Julian,  Hinda Lenis, PA-C  meloxicam (MOBIC) 7.5 MG tablet Take 1 tablet (7.5 mg total) by mouth daily. 12/06/18 12/06/19  Delsa Grana, PA-C  Multiple Vitamin (MULTIVITAMIN) tablet Take 1 tablet by mouth daily.    [provider]  Omega-3 Fatty Acids (FISH OIL PO) Take 1,200 mg daily by mouth.     [provider]  pantoprazole (PROTONIX) 20 MG tablet Take 1 tablet (20 mg total) by mouth every morning. One hour before breakfast 12/18/18   Delsa Grana, PA-C  traMADol (ULTRAM) 50 MG tablet Take 1 tablet (50 mg total) by mouth every 6 (six) hours as needed. 01/08/19 01/08/20  Sable Feil, PA-C  Turmeric 500  MG CAPS Take 300 mg by mouth daily.     [provider]    Allergies Patient has no known allergies.  Family History  Problem Relation Age of Onset  . Hypertension Mother   . Kidney disease Mother   . Alzheimer's disease Mother   . Thyroid cancer Son   . Lung cancer Brother   . Rectal cancer Brother   . Breast cancer Neg Hx     Social History Social History   Tobacco Use  . Smoking status: Current Every Day Smoker    Packs/day: 0.50    Years: 15.00    Pack years: 7.50    Types: Cigarettes    Last attempt to quit: 10/24/2016    Years since quitting: 2.2  . Smokeless tobacco: Never Used  Substance Use Topics  . Alcohol use: Yes    Alcohol/week: 4.0 standard drinks    Types: 4 Standard drinks or equivalent per week    Comment: on occasion  . Drug use: No    Review of Systems Constitutional: Fever/chills.  Fatigue and body aches.  Decreased appetite secondary to nausea. Eyes: No visual changes. ENT: No sore throat. Cardiovascular: Denies chest pain. Respiratory: Denies shortness of breath.  Nonproductive cough. Gastrointestinal: No abdominal pain.  Nausea from vomiting..  No diarrhea.  No constipation. Genitourinary: Negative for dysuria. Musculoskeletal: Negative for back pain. Skin: Negative for rash. Neurological: Negative for headaches, focal weakness or numbness. Psychiatric:  Depression.    ____________________________________________   PHYSICAL EXAM:  VITAL SIGNS: ED Triage Vitals  Enc Vitals Group     BP 01/08/19 0844 134/75     Pulse Rate 01/08/19 0844 85     Resp 01/08/19 0844 18     Temp 01/08/19 0844 99.4 F (37.4 C)     Temp Source 01/08/19 0844 Oral     SpO2 01/08/19 0844 97 %     Weight 01/08/19 0845 208 lb (94.3 kg)     Height 01/08/19 0845 5\' 7"  (1.702 m)     Head Circumference --      Peak Flow --      Pain Score 01/08/19 0844 8     Pain Loc --      Pain Edu? --      Excl. in [place]? --    Constitutional: Alert and oriented.  Well appearing and in no acute distress. Neck: No stridor.   No cervical spine tenderness to palpation. Hematological/Lymphatic/Immunilogical: No cervical lymphadenopathy. Cardiovascular: Normal rate, regular rhythm. Grossly normal heart sounds.  Good peripheral circulation. Respiratory: Normal respiratory effort.  No retractions. Lungs CTAB. Gastrointestinal: Soft and nontender. No distention. No abdominal bruits. No CVA tenderness. Genitourinary: Deferred Musculoskeletal: No lower extremity tenderness nor edema.  No joint effusions. Neurologic:  Normal speech and language. No gross focal neurologic deficits  are appreciated. No gait instability. Skin:  Skin is warm, dry and intact. No rash noted. Psychiatric: Mood and affect are normal. Speech and behavior are normal.  ____________________________________________   LABS (all labs ordered are listed, but only abnormal results are displayed)  Labs Reviewed  SARS CORONAVIRUS 2 (TAT 6-24 HRS)   ____________________________________________  EKG   ____________________________________________  RADIOLOGY  ED MD interpretation:    Official radiology report(s): DG Chest Portable 1 View  Result Date: 01/08/2019 CLINICAL DATA:  68 year old female with cough and chest congestion with fatigue. Smoker. No known sick contacts. EXAM: PORTABLE CHEST 1 VIEW COMPARISON:  Chest radiographs 08/28/2018 and earlier. FINDINGS: Portable AP upright view at 0919 hours. Stable lung volumes. Mediastinal contours remain normal. Visualized tracheal air column is within normal limits. Chronically increased pulmonary interstitial markings, not definitely changed since 2019 radiographs. No pneumothorax or acute pulmonary opacity. No acute osseous abnormality identified. IMPRESSION: Chronic interstitial lung changes which are probably smoking related. No superimposed acute findings are identified. Electronically Signed   By: Genevie Ann M.D.   On: 01/08/2019 09:27     ____________________________________________   PROCEDURES  Procedure(s) performed (including Critical Care):  Procedures   ____________________________________________   INITIAL IMPRESSION / ASSESSMENT AND PLAN / ED COURSE  As part of my medical decision making, I reviewed the following data within the [place]     Patient presents alert generalized body aches which began 2 days ago.  Patient also has nonproductive cough with nausea.  Physical exam is consistent with viral illness.  Discussed x-ray findings with patient.  Patient advised self quarantine pending results of COVID-19 test.  Take medication as directed.    Allison Phillips was evaluated in Emergency Department on 01/08/2019 for the symptoms described in the history of present illness. She was evaluated in the context of the global COVID-19 pandemic, which necessitated consideration that the patient might be at risk for infection with the SARS-CoV-2 virus that causes COVID-19. Institutional protocols and algorithms that pertain to the evaluation of patients at risk for COVID-19 are in a state of rapid change based on information released by regulatory bodies including the CDC and federal and state organizations. These policies and algorithms were followed during the patient's care in the ED.       ____________________________________________   FINAL CLINICAL IMPRESSION(S) / ED DIAGNOSES  Final diagnoses:  Viral illness     ED Discharge Orders         Ordered    traMADol (ULTRAM) 50 MG tablet  Every 6 hours PRN     01/08/19 0952    benzonatate (TESSALON PERLES) 100 MG capsule  3 times daily PRN     01/08/19 0952    ketorolac (TORADOL) 10 MG tablet  Every 6 hours PRN     01/08/19 [id number]           Note:  This document was prepared using Dragon voice recognition software and may include unintentional dictation errors.    Sable Feil, PA-C 01/08/19 [id number]    Lavonia Drafts,  MD 01/08/19 1309

## 2019-01-16 ENCOUNTER — Other Ambulatory Visit: Payer: Self-pay | Admitting: Student

## 2019-01-16 DIAGNOSIS — M7062 Trochanteric bursitis, left hip: Secondary | ICD-10-CM

## 2019-01-16 DIAGNOSIS — M25551 Pain in right hip: Secondary | ICD-10-CM

## 2019-01-16 DIAGNOSIS — M7061 Trochanteric bursitis, right hip: Secondary | ICD-10-CM

## 2019-01-17 ENCOUNTER — Other Ambulatory Visit: Payer: Self-pay

## 2019-01-17 ENCOUNTER — Ambulatory Visit
Admission: RE | Admit: 2019-01-17 | Discharge: 2019-01-17 | Disposition: A | Discharge status: Home / Self Care | Payer: Medicare Other | Source: Ambulatory Visit | Attending: Family Medicine | Admitting: Family Medicine

## 2019-01-17 ENCOUNTER — Encounter: Payer: Self-pay | Admitting: Family Medicine

## 2019-01-17 ENCOUNTER — Ambulatory Visit (INDEPENDENT_AMBULATORY_CARE_PROVIDER_SITE_OTHER): Payer: Medicare Other | Admitting: Family Medicine

## 2019-01-17 VITALS — BP 118/82 | HR 88 | Temp 98.0°F | Resp 14 | Wt 198.9 lb

## 2019-01-17 DIAGNOSIS — A084 Viral intestinal infection, unspecified: Secondary | ICD-10-CM

## 2019-01-17 DIAGNOSIS — R7989 Other specified abnormal findings of blood chemistry: Secondary | ICD-10-CM | POA: Diagnosis not present

## 2019-01-17 DIAGNOSIS — R0781 Pleurodynia: Secondary | ICD-10-CM | POA: Diagnosis not present

## 2019-01-17 DIAGNOSIS — R110 Nausea: Secondary | ICD-10-CM

## 2019-01-17 DIAGNOSIS — R630 Anorexia: Secondary | ICD-10-CM | POA: Diagnosis not present

## 2019-01-17 DIAGNOSIS — M62838 Other muscle spasm: Secondary | ICD-10-CM

## 2019-01-17 DIAGNOSIS — B349 Viral infection, unspecified: Secondary | ICD-10-CM

## 2019-01-17 DIAGNOSIS — K750 Abscess of liver: Secondary | ICD-10-CM | POA: Diagnosis not present

## 2019-01-17 MED ORDER — ONDANSETRON 4 MG PO TBDP: 4.0000 mg | ORAL_TABLET | Freq: Three times a day (TID) | ORAL | 0 refills | Status: DC | PRN

## 2019-01-17 MED ORDER — NAPROXEN 500 MG PO TABS: 500.0000 mg | ORAL_TABLET | Freq: Two times a day (BID) | ORAL | 1 refills | Status: DC | PRN

## 2019-01-17 MED ORDER — TIZANIDINE HCL 2 MG PO TABS: 2.0000 mg | ORAL_TABLET | Freq: Three times a day (TID) | ORAL | 1 refills | Status: DC | PRN

## 2019-01-17 MED ORDER — PANTOPRAZOLE SODIUM 40 MG PO TBEC: 40.0000 mg | DELAYED_RELEASE_TABLET | Freq: Every day | ORAL | 3 refills | Status: DC

## 2019-01-17 NOTE — Progress Notes (Signed)
Patient ID: Allison Phillips, female    DOB: January 05, 1951, 68 y.o.   MRN: [id number]  PCP: Delsa Grana, PA-C  Chief Complaint  Patient presents with   Shoulder Pain    muscle pain all over, has went to 2 diffrent ERs, negative covid test x2    Subjective:   Allison Phillips is a 68 y.o. female, presents to clinic with CC of the following:  She complains of worsening pain to her chest wall her shoulder her back in the scapular area some of the areas of pain are similar to pain she has had on and off for the past year to which I have assessed a few times since meeting her. She is also recently been to the ER several times with recent viral illness Additionally see still complains of blood when she brushes her teeth which we have addressed multiple times Also complains of congestion and nasal symptoms, she was previously referred after starting treatment.  Today she states regarding ENT referral - did not go because of insurance issue that she is unsure of what happened.  ER visit 12/21 to [facility] for N/V and viral illness sx, COVID test was neg  She was diagnosed with a viral illness She was sent home with Tessalon Perles, ketorolac, Zofran and tramadol She presented to the ER with chief complaint of generalized body aches, headache and flulike symptoms.  She also endorsed nasal congestion, nausea, cough, fatigue and body aches, she endorsed taking a flu shot, chest x-ray was positive for interstitial changes but no acute cardiopulmonary pathology no pneumonia, she was given shot of Toradol and Zofran There was no flu test done Patient states that she continued to have decreased appetite, nausea, coughing has nonproductive, and gradually had worsening pain body aches and 1 week later she presented to the ER at [facility] On 12/28 she presented to the ER with pain in her right upper chest, shoulder, throat, ear and back pain was worse with deep inspiration she also endorsed decreased  energy.  Repeat Covid test was done PCR and the EKG was done.  Covid test was again negative, EKG showed normal sinus rhythm She was again given a Toradol injection in the ER and she was discharged with a diagnosis of muscle spasm.  She was prescribed Valium 5 mg to take every 8 hours as needed for muscle spasms Patient instructions from the ER visit 1228 are as follows You can take ibuprofen 600mg  every 6 hours OR mobic (meloxicam) 7.5mg  once per day OR naproxen 500mg  twice per day OR ketorolac 10mg  every 4-6 hours. Do not take more than one of these medications at a time.  You can also add tylenol (acetaminophen) 500-650mg  every 6 hours.  I have prescribed 5 days of diazepam (valium) which is a muscle relaxant and also an anti-anxiety medicine. Do not take while driving because it can cause sleepiness. This medication can be addictive so use only when needed and only for a short time.  Apply warm compresses to the shoulder to help the tense muscles relax. If possible, see if you can schedule a message to help relax the muscles, or use a tennis ball or roller at home to help massage the muscles yourself.   Read through the attached instructions for additional care information. Do not overuse the muscle or attempt to work out until your pain improves. Normal daily activities and mild use is enough to help it heal. Stretch as tolerated after a few  days of rest, ice, and NSAIDs.   Please follow up with your primary care doctor in 1 week if your symptoms do not improve.    Today she complains of fatigue, decreased appetite, weightloss continued pain in right tupper chest to scapula which is exacerbated with breathing with laying on her right side or with any coughing.  She states she has not been eating much she has no further vomiting and she denies any diarrhea constipation she denies abdominal pain, near syncope, exertional chest pain, palpitations, shortness of breath. She has taken some of the  medications given to her but she states that it just makes her fall asleep and knocks her out and she continues to feel bad She is not sure what to take on her chart she does have ketorolac tablets, Mobic, Valium, tramadol, Zofran and Tessalon Perles.   She did stop taking her other medications for GERD and rhinitis    Patient Active Problem List   Diagnosis Date Noted   Prediabetes 01/06/2018   Obesity (BMI 35.0-39.9 without comorbidity) 01/06/2018   CKD (chronic kidney disease) stage 2, GFR 60-89 ml/min 07/07/2017   Osteopenia 04/20/2017   Hip pain, chronic, left 03/18/2017   Lower back pain 03/18/2017   Sacroiliac joint pain 03/18/2017   Screening for osteoporosis 12/30/2016   Preventative health care 12/07/2016   Encounter for hepatitis C screening test for low risk patient 12/06/2016   Essential hypertension, benign 03/08/2016   Cervical pain (neck) 12/31/2014   Lipoma of skin and subcutaneous tissue of neck 08/07/2014   Reflux    Depression    Bursitis of left hip       Current Outpatient Medications:    aspirin EC 81 MG tablet, Take 81 mg by mouth daily., Disp: , Rfl:    benzonatate (TESSALON PERLES) 100 MG capsule, Take 2 capsules (200 mg total) by mouth 3 (three) times daily as needed., Disp: 30 capsule, Rfl: 0   Cholecalciferol (VITAMIN D3) 20 MCG TABS, Take 25 mcg by mouth daily., Disp: , Rfl:    Cyanocobalamin (B-12) 500 MCG TABS, Take 2,500 mcg by mouth daily. , Disp: , Rfl:    diazepam (VALIUM) 5 MG tablet, Take by mouth., Disp: , Rfl:    Echinacea 125 MG CAPS, Take 400 mg as needed by mouth. , Disp: , Rfl:    fluticasone (FLONASE) 50 MCG/ACT nasal spray, Place 2 sprays into both nostrils daily., Disp: 16 g, Rfl: 6   Multiple Vitamin (MULTIVITAMIN) tablet, Take 1 tablet by mouth daily., Disp: , Rfl:    Omega-3 Fatty Acids (FISH OIL PO), Take 1,200 mg daily by mouth. , Disp: , Rfl:    ondansetron (ZOFRAN) 8 MG tablet, Take 1 tablet (8 mg  total) by mouth every 8 (eight) hours as needed for nausea or vomiting., Disp: 20 tablet, Rfl: 0   pantoprazole (PROTONIX) 20 MG tablet, Take 1 tablet (20 mg total) by mouth every morning. One hour before breakfast, Disp: 60 tablet, Rfl: 0   traMADol (ULTRAM) 50 MG tablet, Take 1 tablet (50 mg total) by mouth every 6 (six) hours as needed., Disp: 20 tablet, Rfl: 0   ketorolac (TORADOL) 10 MG tablet, Take 1 tablet (10 mg total) by mouth every 6 (six) hours as needed. (Patient not taking: Reported on 01/17/2019), Disp: 20 tablet, Rfl: 0   meloxicam (MOBIC) 7.5 MG tablet, Take 1 tablet (7.5 mg total) by mouth daily. (Patient not taking: Reported on 01/17/2019), Disp: 30 tablet, Rfl: 0  Turmeric 500 MG CAPS, Take 300 mg by mouth daily. , Disp: , Rfl:    No Known Allergies   Family History  Problem Relation Age of Onset   Hypertension Mother    Kidney disease Mother    Alzheimer's disease Mother    Thyroid cancer Son    Lung cancer Brother    Rectal cancer Brother    Breast cancer Neg Hx      Social History   Socioeconomic History   Marital status: Married    Spouse name: Nadara Mustard   Number of children: 1   Years of education: Not on file   Highest education level: Bachelor's degree (e.g., BA, AB, BS)  Occupational History   Occupation: Retired  Tobacco Use   Smoking status: Current Every Day Smoker    Packs/day: 0.50    Years: 15.00    Pack years: 7.50    Types: Cigarettes    Last attempt to quit: 10/24/2016    Years since quitting: 2.2   Smokeless tobacco: Never Used  Substance and Sexual Activity   Alcohol use: Yes    Alcohol/week: 4.0 standard drinks    Types: 4 Standard drinks or equivalent per week    Comment: on occasion   Drug use: No   Sexual activity: Yes    Partners: Male  Other Topics Concern   Not on file  Social History Narrative   Not on file   Social Determinants of Health   Financial Resource Strain: Low Risk    Difficulty of  Paying Living Expenses: Not hard at all  Food Insecurity: No Food Insecurity   Worried About Charity fundraiser in the Last Year: Never true   [place] in the Last Year: Never true  Transportation Needs: No Transportation Needs   Lack of Transportation (Medical): No   Lack of Transportation (Non-Medical): No  Physical Activity: Inactive   Days of Exercise per Week: 0 days   Minutes of Exercise per Session: 0 min  Stress: No Stress Concern Present   Feeling of Stress : Only a little  Social Connections: Not Isolated   Frequency of Communication with Friends and Family: More than three times a week   Frequency of Social Gatherings with Friends and Family: Twice a week   Attends Religious Services: More than 4 times per year   Active Member of [employer] or Organizations: Yes   Attends Music therapist: More than 4 times per year   Marital Status: Married  Human resources officer Violence: Not At Risk   Fear of Current or Ex-Partner: No   Emotionally Abused: No   Physically Abused: No   Sexually Abused: No    I personally ER visits, labs, x-rays, active problem list, medication list, allergies, family history, social history, health maintenance, notes from last encounter, lab results, imaging with the patient/caregiver today.   Review of Systems  Constitutional: Positive for appetite change and fatigue. Negative for activity change, chills, diaphoresis, fever and unexpected weight change.  HENT: Negative.   Eyes: Negative.   Respiratory: Positive for cough. Negative for choking, chest tightness, shortness of breath, wheezing and stridor.   Cardiovascular: Positive for chest pain (chest wall). Negative for palpitations and leg swelling.  Gastrointestinal: Negative.   Endocrine: Negative.   Genitourinary: Negative.   Musculoskeletal: Positive for myalgias.  Skin: Negative.   Allergic/Immunologic: Negative.   Neurological: Negative.   Hematological:  Negative.   Psychiatric/Behavioral: Negative.   All other systems reviewed and  are negative.      Objective:   Vitals:   01/17/19 0953  BP: 118/82  Pulse: 88  Resp: 14  Temp: 98 F (36.7 C)  SpO2: 98%  Weight: 198 lb 14.4 oz (90.2 kg)    Body mass index is 31.15 kg/m.  Physical Exam Vitals and nursing note reviewed.  Constitutional:      General: She is not in acute distress.    Appearance: Normal appearance. She is well-developed. She is not ill-appearing, toxic-appearing or diaphoretic.     Interventions: Face mask in place.     Comments: Pt appears tired, but non-toxic, NAD  HENT:     Head: Normocephalic and atraumatic.     Right Ear: External ear normal.     Left Ear: External ear normal.  Eyes:     General: Lids are normal. No scleral icterus.       Right eye: No discharge.        Left eye: No discharge.     Conjunctiva/sclera: Conjunctivae normal.  Neck:     Trachea: Phonation normal. No tracheal deviation.  Cardiovascular:     Rate and Rhythm: Normal rate and regular rhythm.     Pulses: Normal pulses.          Radial pulses are 2+ on the right side and 2+ on the left side.       Posterior tibial pulses are 2+ on the right side and 2+ on the left side.     Heart sounds: Normal heart sounds. No murmur. No friction rub. No gallop.   Pulmonary:     Effort: Pulmonary effort is normal. No respiratory distress.     Breath sounds: Normal breath sounds. No stridor. No wheezing, rhonchi or rales.  Chest:     Chest wall: Tenderness present.       Comments: ttp to upper right anterior chest wall and right posterior chest wall between scapular and spine Abdominal:     General: Bowel sounds are normal. There is no distension.     Palpations: Abdomen is soft.     Tenderness: There is no abdominal tenderness. There is no guarding or rebound.  Musculoskeletal:        General: No deformity. Normal range of motion.     Cervical back: Normal range of motion and neck  supple.     Right lower leg: No edema.     Left lower leg: No edema.  Lymphadenopathy:     Cervical: No cervical adenopathy.  Skin:    General: Skin is warm and dry.     Capillary Refill: Capillary refill takes less than 2 seconds.     Coloration: Skin is not jaundiced or pale.     Findings: No rash.  Neurological:     Mental Status: She is alert and oriented to person, place, and time.     Motor: No abnormal muscle tone.     Gait: Gait normal.  Psychiatric:        Speech: Speech normal.        Behavior: Behavior normal.      Results for orders placed or performed during the hospital encounter of 01/08/19  SARS CORONAVIRUS 2 (TAT 6-24 HRS) Nasopharyngeal Nasopharyngeal Swab   Specimen: Nasopharyngeal Swab  Result Value Ref Range   SARS Coronavirus 2 NEGATIVE NEGATIVE        Assessment & Plan:      1. Pleuritic chest pain Recheck x-rays rule out aspiration pneumonia, CAP, etc.  Chest wall ttp, but pt endorses worse.  Inspiration and cough breath sounds on exam clear, may be pleurisy or costochondritis - DG Chest 2 View; Future  2. Viral gastroenteritis She is tolerating p.o.'s, feels excessively thirsty and is only drinking excessive amounts of water but hardly anything else no vomiting or diarrhea over the past week, still has decreased appetite and intermittent nausea - DG Abd 1 View; Future - CMP w GFR - Lipase - CBC w/ Diff - UA w/reflex microscopy, LAB - Mag  3. Decreased appetite Check gas and bowel pattern, treat with Zofran and increase Protonix dose to help treat some likely gastritis with history of GERD and recent vomiting - DG Abd 1 View; Future - CMP w GFR - Lipase - CBC w/ Diff - UA w/reflex microscopy, LAB - Mag  4. Nausea Patient encouraged to continue Zofran as needed Avoid NSAIDs completely, can use Tylenol -dosing was given to her on her after visit summary - CMP w GFR - Lipase  5. Viral illness Covid test negative x2, GI sx but also  nasal symptoms, cough and constitutional symptoms including body aches sweats chills fever in the ER at her first visit and generalized fatigue malaise and decreased appetite Covid testing was negative at 2 different ERs, no other tests were done including lab work or flu. Pt looks more tired than ill, VSS, non-toxic appearing and she is tolerating PO's will do labs to check for electrolyte derangement, leukocytosis, rule out UTI, check magnesium with her vomiting and muscle spasms and complaints, also check lipase and LFTs. - CMP w GFR - Lipase - CBC w/ Diff - UA w/reflex microscopy, LAB - Mag  6. Muscle spasm Recheck electrolytes and magnesium DC Valium and try low-dose tizanidine, patient states it is more bothersome at bedtime I encouraged her to trial using it then, DC tramadol and Valium - CMP w GFR - Mag  For patient's muscle skeletal pain I did encourage her to avoid all NSAIDs for now while she is still recovering from suspected viral gastroenteritis, she was to restart taking Protonix -dose increase, increase hydration with clear fluids with sugar electrolytes, advance nutrition gradually and as tolerated. Did put through naproxen 500 mg to take twice daily if 1 to 2 weeks from now her appetite and stomach are feeling better and she needs more pain management    Delsa Grana, PA-C 01/17/19 10:05 AM

## 2019-01-17 NOTE — Patient Instructions (Addendum)
I recommend taking zofran (ondansetron) as needed for nausea symptoms  Start taking Protonix once a day in the morning to help calm down any stomach or GI system inflammation I would do this for the next 2 to 3 weeks to see if it can help improve your appetite and nausea  Make sure you are hydrating with clear fluids that have some salt and electrolytes even some sugar occasionally is good and needed for you to have a balance of what you need try to avoid only drinking large amounts of water  Going get your x-rays done after this visit I will call you with results  I will prescribe something less strong for muscle spasms which you could take at night  Use Tylenol 650 to 1000 mg up to 3 times a day for any discomfort I would stop taking ibuprofen, ketorolac, Mobic Once your stomach and appetite are better you can take naproxen as needed for pain

## 2019-01-18 ENCOUNTER — Emergency Department: Payer: Medicare Other

## 2019-01-18 ENCOUNTER — Inpatient Hospital Stay: Payer: Medicare Other

## 2019-01-18 ENCOUNTER — Encounter: Payer: Self-pay | Admitting: Family Medicine

## 2019-01-18 ENCOUNTER — Inpatient Hospital Stay
Admission: EM | Admit: 2019-01-18 | Discharge: 2019-01-23 | DRG: 443 | Disposition: A | Discharge status: Home / Self Care | Payer: Medicare Other | Attending: Family Medicine | Admitting: Family Medicine

## 2019-01-18 ENCOUNTER — Other Ambulatory Visit: Payer: Self-pay

## 2019-01-18 DIAGNOSIS — Z801 Family history of malignant neoplasm of trachea, bronchus and lung: Secondary | ICD-10-CM

## 2019-01-18 DIAGNOSIS — K5790 Diverticulosis of intestine, part unspecified, without perforation or abscess without bleeding: Secondary | ICD-10-CM | POA: Diagnosis not present

## 2019-01-18 DIAGNOSIS — Z8719 Personal history of other diseases of the digestive system: Secondary | ICD-10-CM

## 2019-01-18 DIAGNOSIS — Z20822 Contact with and (suspected) exposure to covid-19: Secondary | ICD-10-CM | POA: Diagnosis present

## 2019-01-18 DIAGNOSIS — Z23 Encounter for immunization: Secondary | ICD-10-CM

## 2019-01-18 DIAGNOSIS — Z841 Family history of disorders of kidney and ureter: Secondary | ICD-10-CM | POA: Diagnosis not present

## 2019-01-18 DIAGNOSIS — Z800 Family history of malignant neoplasm of digestive organs: Secondary | ICD-10-CM | POA: Diagnosis not present

## 2019-01-18 DIAGNOSIS — M7061 Trochanteric bursitis, right hip: Secondary | ICD-10-CM | POA: Diagnosis not present

## 2019-01-18 DIAGNOSIS — M7062 Trochanteric bursitis, left hip: Secondary | ICD-10-CM | POA: Diagnosis present

## 2019-01-18 DIAGNOSIS — K5730 Diverticulosis of large intestine without perforation or abscess without bleeding: Secondary | ICD-10-CM | POA: Diagnosis present

## 2019-01-18 DIAGNOSIS — M810 Age-related osteoporosis without current pathological fracture: Secondary | ICD-10-CM | POA: Diagnosis present

## 2019-01-18 DIAGNOSIS — Z8249 Family history of ischemic heart disease and other diseases of the circulatory system: Secondary | ICD-10-CM

## 2019-01-18 DIAGNOSIS — G8929 Other chronic pain: Secondary | ICD-10-CM | POA: Diagnosis present

## 2019-01-18 DIAGNOSIS — Z808 Family history of malignant neoplasm of other organs or systems: Secondary | ICD-10-CM

## 2019-01-18 DIAGNOSIS — K449 Diaphragmatic hernia without obstruction or gangrene: Secondary | ICD-10-CM | POA: Diagnosis present

## 2019-01-18 DIAGNOSIS — R0789 Other chest pain: Secondary | ICD-10-CM | POA: Diagnosis not present

## 2019-01-18 DIAGNOSIS — Z820 Family history of epilepsy and other diseases of the nervous system: Secondary | ICD-10-CM

## 2019-01-18 DIAGNOSIS — K5900 Constipation, unspecified: Secondary | ICD-10-CM | POA: Diagnosis not present

## 2019-01-18 DIAGNOSIS — Z9049 Acquired absence of other specified parts of digestive tract: Secondary | ICD-10-CM | POA: Diagnosis not present

## 2019-01-18 DIAGNOSIS — Z79899 Other long term (current) drug therapy: Secondary | ICD-10-CM | POA: Diagnosis not present

## 2019-01-18 DIAGNOSIS — Z95828 Presence of other vascular implants and grafts: Secondary | ICD-10-CM | POA: Diagnosis not present

## 2019-01-18 DIAGNOSIS — Z98891 History of uterine scar from previous surgery: Secondary | ICD-10-CM | POA: Diagnosis not present

## 2019-01-18 DIAGNOSIS — K219 Gastro-esophageal reflux disease without esophagitis: Secondary | ICD-10-CM | POA: Diagnosis present

## 2019-01-18 DIAGNOSIS — I361 Nonrheumatic tricuspid (valve) insufficiency: Secondary | ICD-10-CM | POA: Diagnosis not present

## 2019-01-18 DIAGNOSIS — Z86010 Personal history of colonic polyps: Secondary | ICD-10-CM

## 2019-01-18 DIAGNOSIS — F17210 Nicotine dependence, cigarettes, uncomplicated: Secondary | ICD-10-CM | POA: Diagnosis present

## 2019-01-18 DIAGNOSIS — R7989 Other specified abnormal findings of blood chemistry: Secondary | ICD-10-CM | POA: Diagnosis present

## 2019-01-18 DIAGNOSIS — Z7982 Long term (current) use of aspirin: Secondary | ICD-10-CM | POA: Diagnosis not present

## 2019-01-18 DIAGNOSIS — L0291 Cutaneous abscess, unspecified: Secondary | ICD-10-CM

## 2019-01-18 DIAGNOSIS — Z978 Presence of other specified devices: Secondary | ICD-10-CM | POA: Diagnosis not present

## 2019-01-18 DIAGNOSIS — K750 Abscess of liver: Secondary | ICD-10-CM

## 2019-01-18 HISTORY — DX: Abscess of liver: K75.0

## 2019-01-18 LAB — CBC WITH DIFFERENTIAL/PLATELET
Abs Immature Granulocytes: 0.41 10*3/uL — ABNORMAL HIGH (ref 0.00–0.07)
Absolute Monocytes: 1656 cells/uL — ABNORMAL HIGH (ref 200–950)
Basophils Absolute: 83 cells/uL (ref 0–200)
Basophils Absolute: 0.1 10*3/uL (ref 0.0–0.1)
Basophils Relative: 0.3 %
Basophils Relative: 0 %
Eosinophils Absolute: 55 cells/uL (ref 15–500)
Eosinophils Absolute: 0 10*3/uL (ref 0.0–0.5)
Eosinophils Relative: 0.2 %
Eosinophils Relative: 0 %
HCT: 34.5 % — ABNORMAL LOW (ref 35.0–45.0)
HCT: 33.3 % — ABNORMAL LOW (ref 36.0–46.0)
Hemoglobin: 11.6 g/dL — ABNORMAL LOW (ref 11.7–15.5)
Hemoglobin: 11.4 g/dL — ABNORMAL LOW (ref 12.0–15.0)
Immature Granulocytes: 2 %
Lymphocytes Relative: 8 %
Lymphs Abs: 2070 cells/uL (ref 850–3900)
Lymphs Abs: 1.9 10*3/uL (ref 0.7–4.0)
MCH: 28.6 pg (ref 27.0–33.0)
MCH: 27.3 pg (ref 26.0–34.0)
MCHC: 33.6 g/dL (ref 32.0–36.0)
MCHC: 34.2 g/dL (ref 30.0–36.0)
MCV: 85 fL (ref 80.0–100.0)
MCV: 79.9 fL — ABNORMAL LOW (ref 80.0–100.0)
MPV: 10 fL (ref 7.5–12.5)
Monocytes Absolute: 1.2 10*3/uL — ABNORMAL HIGH (ref 0.1–1.0)
Monocytes Relative: 6 %
Monocytes Relative: 5 %
Neutro Abs: 23736 cells/uL — ABNORMAL HIGH (ref 1500–7800)
Neutro Abs: 20.3 10*3/uL — ABNORMAL HIGH (ref 1.7–7.7)
Neutrophils Relative %: 86 %
Neutrophils Relative %: 85 %
Platelets: 475 10*3/uL — ABNORMAL HIGH (ref 140–400)
Platelets: 519 10*3/uL — ABNORMAL HIGH (ref 150–400)
RBC: 4.06 10*6/uL (ref 3.80–5.10)
RBC: 4.17 MIL/uL (ref 3.87–5.11)
RDW: 12.5 % (ref 11.0–15.0)
RDW: 12.9 % (ref 11.5–15.5)
Total Lymphocyte: 7.5 %
WBC: 27.6 10*3/uL — ABNORMAL HIGH (ref 3.8–10.8)
WBC: 23.9 10*3/uL — ABNORMAL HIGH (ref 4.0–10.5)
nRBC: 0 % (ref 0.0–0.2)

## 2019-01-18 LAB — COMPREHENSIVE METABOLIC PANEL
ALT: 73 U/L — ABNORMAL HIGH (ref 0–44)
AST: 48 U/L — ABNORMAL HIGH (ref 15–41)
Albumin: 3.1 g/dL — ABNORMAL LOW (ref 3.5–5.0)
Alkaline Phosphatase: 277 U/L — ABNORMAL HIGH (ref 38–126)
Anion gap: 13 (ref 5–15)
BUN: 19 mg/dL (ref 8–23)
CO2: 22 mmol/L (ref 22–32)
Calcium: 9.2 mg/dL (ref 8.9–10.3)
Chloride: 95 mmol/L — ABNORMAL LOW (ref 98–111)
Creatinine, Ser: 0.81 mg/dL (ref 0.44–1.00)
GFR calc Af Amer: >60 mL/min (ref >60)
GFR calc non Af Amer: >60 mL/min (ref >60)
Glucose, Bld: 112 mg/dL — ABNORMAL HIGH (ref 70–99)
Potassium: 4.6 mmol/L (ref 3.5–5.1)
Sodium: 130 mmol/L — ABNORMAL LOW (ref 135–145)
Total Bilirubin: 0.9 mg/dL (ref 0.3–1.2)
Total Protein: 8.3 g/dL — ABNORMAL HIGH (ref 6.5–8.1)

## 2019-01-18 LAB — CBC
HCT: 31.5 % — ABNORMAL LOW (ref 36.0–46.0)
Hemoglobin: 11.6 g/dL — ABNORMAL LOW (ref 12.0–15.0)
MCH: 27.9 pg (ref 26.0–34.0)
MCHC: 36.8 g/dL — ABNORMAL HIGH (ref 30.0–36.0)
MCV: 75.7 fL — ABNORMAL LOW (ref 80.0–100.0)
Platelets: 593 10*3/uL — ABNORMAL HIGH (ref 150–400)
RBC: 4.16 MIL/uL (ref 3.87–5.11)
RDW: 12.9 % (ref 11.5–15.5)
WBC: 23.8 10*3/uL — ABNORMAL HIGH (ref 4.0–10.5)
nRBC: 0 % (ref 0.0–0.2)

## 2019-01-18 LAB — URINALYSIS, COMPLETE (UACMP) WITH MICROSCOPIC
Bacteria, UA: NONE SEEN
Bilirubin Urine: NEGATIVE
Glucose, UA: NEGATIVE mg/dL
Ketones, ur: NEGATIVE mg/dL
Nitrite: NEGATIVE
Protein, ur: 30 mg/dL — AB
Specific Gravity, Urine: 1.015 (ref 1.005–1.030)
pH: 5 (ref 5.0–8.0)

## 2019-01-18 LAB — PROTIME-INR
INR: 1.2 (ref 0.8–1.2)
Prothrombin Time: 15.5 seconds — ABNORMAL HIGH (ref 11.4–15.2)

## 2019-01-18 LAB — COMPLETE METABOLIC PANEL WITH GFR
AG Ratio: 1.1 (calc) (ref 1.0–2.5)
ALT: 65 U/L — ABNORMAL HIGH (ref 6–29)
AST: 59 U/L — ABNORMAL HIGH (ref 10–35)
Albumin: 3.3 g/dL — ABNORMAL LOW (ref 3.6–5.1)
Alkaline phosphatase (APISO): 289 U/L — ABNORMAL HIGH (ref 37–153)
BUN: 16 mg/dL (ref 7–25)
CO2: 24 mmol/L (ref 20–32)
Calcium: 8.9 mg/dL (ref 8.6–10.4)
Chloride: 95 mmol/L — ABNORMAL LOW (ref 98–110)
Creat: 0.75 mg/dL (ref 0.50–0.99)
GFR, Est African American: 96 mL/min/{1.73_m2} (ref >=60)
GFR, Est Non African American: 82 mL/min/{1.73_m2} (ref >=60)
Globulin: 3.1 g/dL (calc) (ref 1.9–3.7)
Glucose, Bld: 100 mg/dL — ABNORMAL HIGH (ref 65–99)
Potassium: 5.2 mmol/L (ref 3.5–5.3)
Sodium: 131 mmol/L — ABNORMAL LOW (ref 135–146)
Total Bilirubin: 0.8 mg/dL (ref 0.2–1.2)
Total Protein: 6.4 g/dL (ref 6.1–8.1)

## 2019-01-18 LAB — URINALYSIS, ROUTINE W REFLEX MICROSCOPIC
Bacteria, UA: NONE SEEN /HPF
Bilirubin Urine: NEGATIVE
Glucose, UA: NEGATIVE
Hgb urine dipstick: NEGATIVE
Hyaline Cast: NONE SEEN /LPF
Ketones, ur: NEGATIVE
Leukocytes,Ua: NEGATIVE
Nitrite: NEGATIVE
Specific Gravity, Urine: 1.014 (ref 1.001–1.03)
Squamous Epithelial / HPF: NONE SEEN /HPF (ref <=5)
WBC, UA: NONE SEEN /HPF (ref 0–5)
pH: 6 (ref 5.0–8.0)

## 2019-01-18 LAB — RESPIRATORY PANEL BY RT PCR (FLU A&B, COVID)
Influenza A by PCR: NEGATIVE
Influenza B by PCR: NEGATIVE
SARS Coronavirus 2 by RT PCR: NEGATIVE

## 2019-01-18 LAB — LACTIC ACID, PLASMA: Lactic Acid, Venous: 0.6 mmol/L (ref 0.5–1.9)

## 2019-01-18 LAB — MAGNESIUM: Magnesium: 2.2 mg/dL (ref 1.5–2.5)

## 2019-01-18 LAB — LIPASE, BLOOD: Lipase: 26 U/L (ref 11–51)

## 2019-01-18 LAB — LIPASE: Lipase: 25 U/L (ref 7–60)

## 2019-01-18 MED ORDER — MIDAZOLAM HCL 2 MG/2ML IJ SOLN
INTRAMUSCULAR | Status: AC
  Filled 2019-01-18: qty 4

## 2019-01-18 MED ORDER — FENTANYL CITRATE (PF) 100 MCG/2ML IJ SOLN
INTRAMUSCULAR | Status: AC | PRN
  Administered 2019-01-18 (×3): 50 ug via INTRAVENOUS

## 2019-01-18 MED ORDER — MIDAZOLAM HCL 2 MG/2ML IJ SOLN
INTRAMUSCULAR | Status: AC | PRN
  Administered 2019-01-18 (×3): 1 mg via INTRAVENOUS

## 2019-01-18 MED ORDER — SODIUM CHLORIDE 0.9% FLUSH
3.0000 mL | Freq: Two times a day (BID) | INTRAVENOUS | Status: DC
  Administered 2019-01-18 – 2019-01-20 (×4): 3 mL via INTRAVENOUS

## 2019-01-18 MED ORDER — HYDROCODONE-ACETAMINOPHEN 5-325 MG PO TABS
1.0000 | ORAL_TABLET | ORAL | Status: DC | PRN
  Administered 2019-01-18 (×2): 2 via ORAL
  Administered 2019-01-19 (×4): 1 via ORAL
  Administered 2019-01-20: 2 via ORAL
  Administered 2019-01-23: 1 via ORAL
  Filled 2019-01-18: qty 2
  Filled 2019-01-18 (×3): qty 1
  Filled 2019-01-18: qty 2
  Filled 2019-01-18: qty 1
  Filled 2019-01-18: qty 2
  Filled 2019-01-18: qty 1

## 2019-01-18 MED ORDER — SODIUM CHLORIDE 0.9 % IV BOLUS
500.0000 mL | Freq: Once | INTRAVENOUS | Status: AC
  Administered 2019-01-18: 500 mL via INTRAVENOUS

## 2019-01-18 MED ORDER — SODIUM CHLORIDE 0.9 % IV SOLN: Freq: Once | INTRAVENOUS | Status: AC

## 2019-01-18 MED ORDER — PIPERACILLIN-TAZOBACTAM 3.375 G IVPB
3.3750 g | Freq: Three times a day (TID) | INTRAVENOUS | Status: DC
  Administered 2019-01-18 – 2019-01-22 (×11): 3.375 g via INTRAVENOUS
  Filled 2019-01-18 (×11): qty 50

## 2019-01-18 MED ORDER — SODIUM CHLORIDE 0.9% FLUSH
3.0000 mL | Freq: Two times a day (BID) | INTRAVENOUS | Status: DC
  Administered 2019-01-18 – 2019-01-20 (×3): 3 mL via INTRAVENOUS

## 2019-01-18 MED ORDER — ONDANSETRON HCL 4 MG/2ML IJ SOLN
4.0000 mg | Freq: Four times a day (QID) | INTRAMUSCULAR | Status: DC | PRN
  Administered 2019-01-21: 4 mg via INTRAVENOUS
  Filled 2019-01-18: qty 2

## 2019-01-18 MED ORDER — SODIUM CHLORIDE 0.9% FLUSH
3.0000 mL | Freq: Once | INTRAVENOUS | Status: AC
  Administered 2019-01-18: 15:00:00 3 mL via INTRAVENOUS

## 2019-01-18 MED ORDER — PNEUMOCOCCAL VAC POLYVALENT 25 MCG/0.5ML IJ INJ
0.5000 mL | INJECTION | INTRAMUSCULAR | Status: AC
  Administered 2019-01-19: 0.5 mL via INTRAMUSCULAR
  Filled 2019-01-18: qty 0.5

## 2019-01-18 MED ORDER — IOHEXOL 300 MG/ML  SOLN
100.0000 mL | Freq: Once | INTRAMUSCULAR | Status: AC | PRN
  Administered 2019-01-18: 100 mL via INTRAVENOUS
  Filled 2019-01-18: qty 100

## 2019-01-18 MED ORDER — ONDANSETRON HCL 4 MG PO TABS: 4.0000 mg | ORAL_TABLET | Freq: Four times a day (QID) | ORAL | Status: DC | PRN

## 2019-01-18 MED ORDER — METRONIDAZOLE IN NACL 5-0.79 MG/ML-% IV SOLN
500.0000 mg | Freq: Once | INTRAVENOUS | Status: AC
  Administered 2019-01-18: 14:00:00 500 mg via INTRAVENOUS
  Filled 2019-01-18: qty 100

## 2019-01-18 MED ORDER — MORPHINE SULFATE (PF) 2 MG/ML IV SOLN: 2.0000 mg | INTRAVENOUS | Status: DC | PRN

## 2019-01-18 MED ORDER — SODIUM CHLORIDE 0.9% FLUSH: 3.0000 mL | INTRAVENOUS | Status: DC | PRN

## 2019-01-18 MED ORDER — FENTANYL CITRATE (PF) 100 MCG/2ML IJ SOLN
INTRAMUSCULAR | Status: AC
  Filled 2019-01-18: qty 4

## 2019-01-18 MED ORDER — FLUTICASONE PROPIONATE 50 MCG/ACT NA SUSP
2.0000 | Freq: Every day | NASAL | Status: DC
  Administered 2019-01-18 – 2019-01-21 (×4): 2 via NASAL
  Filled 2019-01-18: qty 16

## 2019-01-18 MED ORDER — ACETAMINOPHEN 325 MG PO TABS: 650.0000 mg | ORAL_TABLET | Freq: Four times a day (QID) | ORAL | Status: DC | PRN

## 2019-01-18 MED ORDER — SODIUM CHLORIDE 0.9% FLUSH
5.0000 mL | Freq: Three times a day (TID) | INTRAVENOUS | Status: DC
  Administered 2019-01-18 – 2019-01-20 (×5): 5 mL
  Administered 2019-01-20: 10 mL
  Administered 2019-01-20 – 2019-01-23 (×8): 5 mL

## 2019-01-18 MED ORDER — METRONIDAZOLE IN NACL 5-0.79 MG/ML-% IV SOLN
500.0000 mg | Freq: Three times a day (TID) | INTRAVENOUS | Status: DC
  Administered 2019-01-18 – 2019-01-22 (×11): 500 mg via INTRAVENOUS
  Filled 2019-01-18 (×13): qty 100

## 2019-01-18 MED ORDER — ACETAMINOPHEN 650 MG RE SUPP: 650.0000 mg | Freq: Four times a day (QID) | RECTAL | Status: DC | PRN

## 2019-01-18 MED ORDER — SODIUM CHLORIDE 0.9 % IV SOLN
250.0000 mL | INTRAVENOUS | Status: DC | PRN
  Administered 2019-01-18 – 2019-01-21 (×4): 250 mL via INTRAVENOUS

## 2019-01-18 MED ORDER — PIPERACILLIN-TAZOBACTAM 3.375 G IVPB 30 MIN
3.3750 g | Freq: Once | INTRAVENOUS | Status: AC
  Administered 2019-01-18: 3.375 g via INTRAVENOUS
  Filled 2019-01-18: qty 50

## 2019-01-18 NOTE — H&P (Signed)
History and Physical  HIDI LANSER [id number] DOB: 1950-12-09 DOA: 01/18/2019  PCP: Delsa Grana, PA-C   Chief Complaint: Vomiting  HPI:  68 year old woman with really any significant past medical history presented with intermittent nausea and vomiting, chills and sweats for nearly 2 weeks.  Further evaluation revealed multiple hepatic abscesses and she was admitted for further evaluation.  Symptoms began 12/19 with intermittent vomiting, chills, sweats.  Episodes at night where she had to change pajamas several times.  No abdominal pain.  Did have right shoulder pain.  She went to be evaluated in the emergency department several times, was given Protonix, Zofran and other medications, the only thing that helped a little bit was Zofran which helped with the vomiting.  Thought to have gastroenteritis.  Course seemed to wax and wane without other specific associated factors.  Today she was actually feeling well but was sent to the emergency department for abnormal lab work.  She is generally healthy, takes only vitamins, does smoke a little bit and drink a little bit but denies any drug use, no travel.  Chart review: . No previous hospitalizations  ED Course: Started on antibiotics, sent for percutaneous drain placement  Review of Systems:  Negative for fever but positive for chills and sweats.  Negative for visual changes, sore throat, rash.  Positive for muscle aches.  Negative for chest pain, shortness of breath, dysuria, bleeding, abdominal pain.  PMH . Hip bursitis . Remainder reviewed in Epic  [place] . Cesarean section, appendectomy . Remainder reviewed in Epic  Family history includes: . Brother with lung cancer . Remainder reviewed in [place] History . Smokes less than half a pack a day . Occasional alcohol . No drugs  Allergies . None  Meds include: Marland Kitchen Multiple vitamins are only regular medication. Angus Seller reviewed in Epic  Current  Facility-Administered Medications:  .  0.9 %  sodium chloride infusion, 250 mL, Intravenous, PRN, Samuella Cota, MD .  acetaminophen (TYLENOL) tablet 650 mg, 650 mg, Oral, Q6H PRN **OR** acetaminophen (TYLENOL) suppository 650 mg, 650 mg, Rectal, Q6H PRN, Samuella Cota, MD .  fentaNYL (SUBLIMAZE) 100 MCG/2ML injection, , , ,  .  fluticasone (FLONASE) 50 MCG/ACT nasal spray 2 spray, 2 spray, Each Nare, Daily, Samuella Cota, MD .  HYDROcodone-acetaminophen (NORCO/VICODIN) 5-325 MG per tablet 1-2 tablet, 1-2 tablet, Oral, Q4H PRN, Samuella Cota, MD, 2 tablet at 01/18/19 1844 .  metroNIDAZOLE (FLAGYL) IVPB 500 mg, 500 mg, Intravenous, Q8H, Samuella Cota, MD .  midazolam (VERSED) 2 MG/2ML injection, , , ,  .  morphine 2 MG/ML injection 2 mg, 2 mg, Intravenous, Q3H PRN, Samuella Cota, MD .  ondansetron (ZOFRAN) tablet 4 mg, 4 mg, Oral, Q6H PRN **OR** ondansetron (ZOFRAN) injection 4 mg, 4 mg, Intravenous, Q6H PRN, Samuella Cota, MD .  piperacillin-tazobactam (ZOSYN) IVPB 3.375 g, 3.375 g, Intravenous, Q8H, Samuella Cota, MD .  Derrill Memo ON 01/19/2019] pneumococcal 23 valent vaccine (PNEUMOVAX-23) injection 0.5 mL, 0.5 mL, Intramuscular, Tomorrow-1000, Samuella Cota, MD .  sodium chloride flush (NS) 0.9 % injection 3 mL, 3 mL, Intravenous, Q12H, Samuella Cota, MD .  sodium chloride flush (NS) 0.9 % injection 3 mL, 3 mL, Intravenous, Q12H, Samuella Cota, MD .  sodium chloride flush (NS) 0.9 % injection 3 mL, 3 mL, Intravenous, PRN, Samuella Cota, MD .  sodium chloride flush (NS) 0.9 % injection 5 mL, 5 mL, Intracatheter, Q8H, Samuella Cota, MD  Physicial Exam   Vitals:  . Afebrile, 98.3, 17, 130/67, 100% on room air  Constitutional:   . Appears calm, uncomfortable, nontoxic Eyes:  Marland Kitchen Appear grossly unremarkable ENMT:  . grossly normal hearing  . Lips appear normal Respiratory:  . CTA bilaterally, no w/r/r.  . Respiratory effort  normal.  Cardiovascular:  . RRR, no m/r/g . No LE extremity edema   Abdomen:  . Soft, no lower abdominal tenderness.  Pain around drain sites. Musculoskeletal:  . Digits/nails BUE: no clubbing, cyanosis, petechiae, infection . RUE, LUE, RLE, LLE   o strength and tone grossly normal, no atrophy, no abnormal movements o No tenderness, masses Skin:  . No rashes, lesions, ulcers . palpation of skin: no induration or nodules Neurologic:  . Grossly nonfocal Psychiatric:  . Mental status o Mood, affect appropriate . judgment and insight appear intact   I have personally reviewed following labs and imaging studies  Labs:  Marland Kitchen AST 40, ALT 73, alkaline phosphatase 277 . WBC 23.9, remainder CBC unremarkable . Lactic acid within normal limits   Significant Hospital Events   . 12/31 admitted for hepatic abscesses   Consults:  . Interventional radiology . Infectious disease   Procedures:  . 12/31 CT-guided percutaneous hepatic drain placements  Significant Diagnostic Tests:  . Covid, influenza negative . 12/31 CT abdomen pelvis multiple liver abscesses, bowel thickening likely secondary to chronic diverticular disease . 12/31 chest x-ray no acute disease   Micro Data:  . 12/31 blood cultures   Antimicrobials:  . Zosyn 12/31 . Flagyl 12/31  ASSESSMENT/PLAN  Multiple hepatic abscesses with cellulitis, without sepsis --Etiology unclear, no risk factors identified --Appreciate infectious disease consultation.  Continue Zosyn, Flagyl --Follow-up culture data, E histolytica serology --We will need antibiotics at least 4 weeks, PICC line once blood cultures are known to be negative --GI consult to assess for source --Pain medication for pain at drain sites  DVT prophylaxis: SCDs Code Status: Full Family Communication: none Consults called: ID    Time spent: 62 minutes  Murray Hodgkins, MD  [facility] Direct contact: see [email or web address]  7PM-7AM contact night  coverage as below   1. Check the care team in [facility] and look for a) attending/consulting [facility] provider listed and b) the [facility] team listed 2. Log into [email or web address] and use [facility] universal password to access. If you do not have the password, please contact the hospital operator. 3. Locate the [facility] provider you are looking for under [facility] and page to a number that you can be directly reached. 4. If you still have difficulty reaching the provider, please page the [facility] (Director on Call) for the Hospitalists listed on amion for assistance.  Severity of Illness: The appropriate patient status for this patient is INPATIENT. Inpatient status is judged to be reasonable and necessary in order to provide the required intensity of service to ensure the patient's safety. The patient's presenting symptoms, physical exam findings, and initial radiographic and laboratory data in the context of their chronic comorbidities is felt to place them at high risk for further clinical deterioration. Furthermore, it is not anticipated that the patient will be medically stable for discharge from the hospital within 2 midnights of admission. The following factors support the patient status of inpatient.   " The patient's presenting symptoms include vomiting " The worrisome physical exam findings include benign. " The initial radiographic and laboratory data are worrisome because of multiple hepatic abscesses.  * I certify that at the point of  admission it is my clinical judgment that the patient will require inpatient hospital care spanning beyond 2 midnights from the point of admission due to high intensity of service, high risk for further deterioration and high frequency of surveillance required.*   01/18/2019, 7:00 PM   Active Problems:   Liver abscess

## 2019-01-18 NOTE — ED Notes (Signed)
Pt ambulatory to toilet with steady gait noted.  

## 2019-01-18 NOTE — ED Notes (Signed)
Pt states she hasn't eaten anything today. States hasn't taken aspirin in 2 weeks. Denies any other blood thinner use.

## 2019-01-18 NOTE — ED Notes (Signed)
Updated pt on delays. Pt remains ambulatory and oriented.

## 2019-01-18 NOTE — ED Notes (Signed)
Lactic on ice sent to lab at this time.

## 2019-01-18 NOTE — ED Provider Notes (Signed)
[facility] Emergency Department Provider Note    First MD Initiated Contact with Patient 01/18/19 1224     (approximate)  I have reviewed the triage vital signs and the nursing notes.   HISTORY  Chief Complaint Abnormal Lab    HPI Allison Phillips is a 68 y.o. female the below listed past medical history presents to the ER for progressively worsening nausea vomiting as well as epigastric abdominal discomfort with radiation to her shoulders.  Denies any back pain.  No shortness of breath at this time.  Is not had any fevers.  Has been seen multiple times in urgent care in ERs and diagnosed with viral enteritis but is failing to improve.  States that she has having night sweats.  Not having any measured temperature.  Denies any dysuria.  Does still have her gallbladder.  Does have a history of diverticulosis and diverticulitis but this is somewhat different.  States she is having decreased bowel movements.    Past Medical History:  Diagnosis Date  . Bursitis of left hip   . Depression   . Diverticulitis   . Hypertension   . Joint pain   . Obesity   . Osteopenia 04/20/2017   April 2019; next DEXA April 2021  . Osteoporosis   . Prediabetes   . Reflux   . Tobacco use    Family History  Problem Relation Age of Onset  . Hypertension Mother   . Kidney disease Mother   . Alzheimer's disease Mother   . Thyroid cancer Son   . Lung cancer Brother   . Rectal cancer Brother   . Breast cancer Neg Hx    Past Surgical History:  Procedure Laterality Date  . ANAL FISSURE REPAIR    . APPENDECTOMY    . [place]  . COLONOSCOPY WITH PROPOFOL N/A 12/18/2014   Procedure: COLONOSCOPY WITH PROPOFOL;  Surgeon: Christene Lye, MD;  Location: [facility] ENDOSCOPY;  Service: Endoscopy;  Laterality: N/A;  . COLONOSCOPY WITH PROPOFOL N/A 02/17/2018   Procedure: COLONOSCOPY WITH PROPOFOL;  Surgeon: Virgel Manifold, MD;  Location: [facility] ENDOSCOPY;   Service: Endoscopy;  Laterality: N/A;  . LIPOMA EXCISION  07/2015   right shoulder  . LIPOMA EXCISION  11/19/2015  . OOPHORECTOMY     for "fibroids" not sure which ovary   Patient Active Problem List   Diagnosis Date Noted  . Liver abscess 01/18/2019  . Prediabetes 01/06/2018  . Obesity (BMI 35.0-39.9 without comorbidity) 01/06/2018  . CKD (chronic kidney disease) stage 2, GFR 60-89 ml/min 07/07/2017  . Osteopenia 04/20/2017  . Hip pain, chronic, left 03/18/2017  . Lower back pain 03/18/2017  . Sacroiliac joint pain 03/18/2017  . Screening for osteoporosis 12/30/2016  . Preventative health care 12/07/2016  . Encounter for hepatitis C screening test for low risk patient 12/06/2016  . Essential hypertension, benign 03/08/2016  . Cervical pain (neck) 12/31/2014  . Lipoma of skin and subcutaneous tissue of neck 08/07/2014  . Reflux   . Depression   . Bursitis of left hip       Prior to Admission medications   Medication Sig Start Date End Date Taking? Authorizing Provider  aspirin EC 81 MG tablet Take 81 mg by mouth daily.    [provider]  benzonatate (TESSALON PERLES) 100 MG capsule Take 2 capsules (200 mg total) by mouth 3 (three) times daily as needed. 01/08/19 01/08/20  Sable Feil, PA-C  Cholecalciferol (VITAMIN D3) 20 MCG  TABS Take 25 mcg by mouth daily.    [provider]  Cyanocobalamin (B-12) 500 MCG TABS Take 2,500 mcg by mouth daily.     [provider]  Echinacea 125 MG CAPS Take 400 mg as needed by mouth.     [provider]  fluticasone (FLONASE) 50 MCG/ACT nasal spray Place 2 sprays into both nostrils daily. 11/17/18   Delsa Grana, PA-C  Multiple Vitamin (MULTIVITAMIN) tablet Take 1 tablet by mouth daily.    [provider]  naproxen (NAPROSYN) 500 MG tablet Take 1 tablet (500 mg total) by mouth 2 (two) times daily as needed for moderate pain. 01/17/19   Delsa Grana, PA-C  Omega-3 Fatty Acids (FISH OIL PO) Take  1,200 mg daily by mouth.     [provider]  ondansetron (ZOFRAN ODT) 4 MG disintegrating tablet Take 1 tablet (4 mg total) by mouth every 8 (eight) hours as needed for nausea or vomiting. 01/17/19   Delsa Grana, PA-C  ondansetron (ZOFRAN) 8 MG tablet Take 1 tablet (8 mg total) by mouth every 8 (eight) hours as needed for nausea or vomiting. 01/08/19   Sable Feil, PA-C  pantoprazole (PROTONIX) 40 MG tablet Take 1 tablet (40 mg total) by mouth daily before breakfast. 01/17/19   Delsa Grana, PA-C  tiZANidine (ZANAFLEX) 2 MG tablet Take 1-2 tablets (2-4 mg total) by mouth every 8 (eight) hours as needed for muscle spasms. 01/17/19   Delsa Grana, PA-C  Turmeric 500 MG CAPS Take 300 mg by mouth daily.     [provider]    Allergies Patient has no known allergies.    Social History Social History   Tobacco Use  . Smoking status: Current Every Day Smoker    Packs/day: 0.50    Years: 15.00    Pack years: 7.50    Types: Cigarettes    Last attempt to quit: 10/24/2016    Years since quitting: 2.2  . Smokeless tobacco: Never Used  Substance Use Topics  . Alcohol use: Yes    Alcohol/week: 4.0 standard drinks    Types: 4 Standard drinks or equivalent per week    Comment: on occasion  . Drug use: No    Review of Systems Patient denies headaches, rhinorrhea, blurry vision, numbness, shortness of breath, chest pain, edema, cough, abdominal pain, nausea, vomiting, diarrhea, dysuria, fevers, rashes or hallucinations unless otherwise stated above in HPI. ____________________________________________   PHYSICAL EXAM:  VITAL SIGNS: Vitals:   01/18/19 1043 01/18/19 1313  BP: (!) 143/87   Pulse: 97   Resp: 18   Temp:  99.3 F (37.4 C)  SpO2: 99%     Constitutional: Alert and oriented.  Eyes: Conjunctivae are normal.  Head: Atraumatic. Nose: No congestion/rhinnorhea. Mouth/Throat: Mucous membranes are moist.   Neck: No stridor. Painless ROM.  Cardiovascular:  Normal rate, regular rhythm. Grossly normal heart sounds.  Good peripheral circulation. Respiratory: Normal respiratory effort.  No retractions. Lungs CTAB. Gastrointestinal: Soft with mild epigastric and ruq ttp. No distention. No abdominal bruits. No CVA tenderness. Genitourinary:  Musculoskeletal: No lower extremity tenderness nor edema.  No joint effusions. Neurologic:  Normal speech and language. No gross focal neurologic deficits are appreciated. No facial droop Skin:  Skin is warm, dry and intact. No rash noted. Psychiatric: Mood and affect are normal. Speech and behavior are normal.  ____________________________________________   LABS (all labs ordered are listed, but only abnormal results are displayed)  Results for orders placed or performed during the  hospital encounter of 01/18/19 (from the past 24 hour(s))  Lipase, blood     Status: None   Collection Time: 01/18/19 10:54 AM  Result Value Ref Range   Lipase 26 11 - 51 U/L  Comprehensive metabolic panel     Status: Abnormal   Collection Time: 01/18/19 10:54 AM  Result Value Ref Range   Sodium 130 (L) 135 - 145 mmol/L   Potassium 4.6 3.5 - 5.1 mmol/L   Chloride 95 (L) 98 - 111 mmol/L   CO2 22 22 - 32 mmol/L   Glucose, Bld 112 (H) 70 - 99 mg/dL   BUN 19 8 - 23 mg/dL   Creatinine, Ser 0.81 0.44 - 1.00 mg/dL   Calcium 9.2 8.9 - 10.3 mg/dL   Total Protein 8.3 (H) 6.5 - 8.1 g/dL   Albumin 3.1 (L) 3.5 - 5.0 g/dL   AST 48 (H) 15 - 41 U/L   ALT 73 (H) 0 - 44 U/L   Alkaline Phosphatase 277 (H) 38 - 126 U/L   Total Bilirubin 0.9 0.3 - 1.2 mg/dL   GFR calc non Af Amer >60 >60 mL/min   GFR calc Af Amer >60 >60 mL/min   Anion gap 13 5 - 15  CBC     Status: Abnormal   Collection Time: 01/18/19 10:54 AM  Result Value Ref Range   WBC 23.8 (H) 4.0 - 10.5 K/uL   RBC 4.16 3.87 - 5.11 MIL/uL   Hemoglobin 11.6 (L) 12.0 - 15.0 g/dL   HCT 31.5 (L) 36.0 - 46.0 %   MCV 75.7 (L) 80.0 - 100.0 fL   MCH 27.9 26.0 - 34.0 pg   MCHC 36.8  (H) 30.0 - 36.0 g/dL   RDW 12.9 11.5 - 15.5 %   Platelets 593 (H) 150 - 400 K/uL   nRBC 0.0 0.0 - 0.2 %  Urinalysis, Complete w Microscopic     Status: Abnormal   Collection Time: 01/18/19 10:54 AM  Result Value Ref Range   Color, Urine YELLOW (A) YELLOW   APPearance HAZY (A) CLEAR   Specific Gravity, Urine 1.015 1.005 - 1.030   pH 5.0 5.0 - 8.0   Glucose, UA NEGATIVE NEGATIVE mg/dL   Hgb urine dipstick SMALL (A) NEGATIVE   Bilirubin Urine NEGATIVE NEGATIVE   Ketones, ur NEGATIVE NEGATIVE mg/dL   Protein, ur 30 (A) NEGATIVE mg/dL   Nitrite NEGATIVE NEGATIVE   Leukocytes,Ua TRACE (A) NEGATIVE   RBC / HPF 0-5 0 - 5 RBC/hpf   WBC, UA 0-5 0 - 5 WBC/hpf   Bacteria, UA NONE SEEN NONE SEEN   Squamous Epithelial / LPF 0-5 0 - 5  Protime-INR     Status: Abnormal   Collection Time: 01/18/19 12:58 PM  Result Value Ref Range   Prothrombin Time 15.5 (H) 11.4 - 15.2 seconds   INR 1.2 0.8 - 1.2  CBC with Differential/Platelet     Status: Abnormal   Collection Time: 01/18/19 12:58 PM  Result Value Ref Range   WBC 23.9 (H) 4.0 - 10.5 K/uL   RBC 4.17 3.87 - 5.11 MIL/uL   Hemoglobin 11.4 (L) 12.0 - 15.0 g/dL   HCT 33.3 (L) 36.0 - 46.0 %   MCV 79.9 (L) 80.0 - 100.0 fL   MCH 27.3 26.0 - 34.0 pg   MCHC 34.2 30.0 - 36.0 g/dL   RDW 12.9 11.5 - 15.5 %   Platelets 519 (H) 150 - 400 K/uL   nRBC 0.0 0.0 - 0.2 %  Neutrophils Relative % 85 %   Neutro Abs 20.3 (H) 1.7 - 7.7 K/uL   Lymphocytes Relative 8 %   Lymphs Abs 1.9 0.7 - 4.0 K/uL   Monocytes Relative 5 %   Monocytes Absolute 1.2 (H) 0.1 - 1.0 K/uL   Eosinophils Relative 0 %   Eosinophils Absolute 0.0 0.0 - 0.5 K/uL   Basophils Relative 0 %   Basophils Absolute 0.1 0.0 - 0.1 K/uL   Immature Granulocytes 2 %   Abs Immature Granulocytes 0.41 (H) 0.00 - 0.07 K/uL   ____________________________________________  EKG ____________________________________________  RADIOLOGY  I personally reviewed all radiographic images ordered to  evaluate for the above acute complaints and reviewed radiology reports and findings.  These findings were personally discussed with the patient.  Please see medical record for radiology report.  ____________________________________________   PROCEDURES  Procedure(s) performed:  Procedures    Critical Care performed: yes ____________________________________________   INITIAL IMPRESSION / ASSESSMENT AND PLAN / ED COURSE  Pertinent labs & imaging results that were available during my care of the patient were reviewed by me and considered in my medical decision making (see chart for details).   DDX: enteritis, gatritias, sbo, goo, pancreatitis, pna, malignancy, sepsis, cholelithiasis, cholecystitis  Allison Phillips is a 68 y.o. who presents to the ED with symptoms as described above.  Does have markedly elevated leukocytosis low-grade temperature does have some epigastric pain.  CT imaging will be ordered for by differential will order IV fluids.  Clinical Course as of Jan 17 1425  Thu Jan 18, 2019  1330 CT imaging shows evidence of multiple large hepatic abscesses.  Will give broad-spectrum antibiotics.  Will discuss with IR surgery and hospitalist to coordinate care plan.   [PR]  [id number] Case discussed with Dr. Vernard Gambles of IR.  Would be appropriate for IR management at this facility.  Will discuss with hospitalist for admission.   [PR]    Clinical Course User Index [PR] Merlyn Lot, MD    The patient was evaluated in Emergency Department today for the symptoms described in the history of present illness. He/she was evaluated in the context of the global COVID-19 pandemic, which necessitated consideration that the patient might be at risk for infection with the SARS-CoV-2 virus that causes COVID-19. Institutional protocols and algorithms that pertain to the evaluation of patients at risk for COVID-19 are in a state of rapid change based on information released by regulatory bodies  including the CDC and federal and state organizations. These policies and algorithms were followed during the patient's care in the ED.  As part of my medical decision making, I reviewed the following data within the [place] notes reviewed and incorporated, Labs reviewed, notes from prior ED visits and [place] Controlled Substance Database   ____________________________________________   FINAL CLINICAL IMPRESSION(S) / ED DIAGNOSES  Final diagnoses:  Hepatic abscess      NEW MEDICATIONS STARTED DURING THIS VISIT:  New Prescriptions   No medications on file     Note:  This document was prepared using Dragon voice recognition software and may include unintentional dictation errors.    Merlyn Lot, MD 01/18/19 1426

## 2019-01-18 NOTE — Consult Note (Signed)
NAME: Allison Phillips  DOB: 1950-04-08  MRN: [id number]  Date/Time: 01/18/2019 3:10 PM  REQUESTING PROVIDER: Sarajane Jews Subjective:  REASON FOR CONSULT: liver abscess ? Allison Phillips is a 68 y.o. female with a history of trochanteric bursitis for which she got on 12/29 b/l steroid injection is admitted with 2 week historyof nausea, rt sided chest pain, night sweats, low grade fever Pt says on 12/19 she was returning after visiting her son in [place] when she started having nausea , it was followed by some chills and she then had rt sided chest pain. She came to the ED on 12/21 and was thought to have a viral illness- COVID was negative. She was asked to take NSAID. She then started having night sweats, poor appetite, constipation and whenever she burped she had pain over the rt shoulder and rt side of the jaw. She went to [facility] ED on 12/28 and was diagnosed as muscle spasm. She saw ortho for b/l hip pain ( chronic) and got b/l bursa cortisone injection. She then went to her PCP on 01/17/19 and  labs done and she was given protonix, zofran . She says the protonix helped a lot. as the WBC was high she was sent to ED. She had CT chest and that showed multiple liver abscesses. She received a dose of zosyn and flagyl and IR has placed 2 drains in the liver Pt denies travel She has never been out of the country No preceding diarrhea No raw sea food No Gi bleed She had Colonoscopy Jan 2020 and a few sessile polyps removed and colonic diverticula seen No antibiotics in  Along time Lives with her husband who had 2 bouts of cdiff this year following knee replacement. No pets .Does not take any meds other than vitamins  Past Medical History:  Diagnosis Date  . Bursitis of left hip   . Depression   . Diverticulitis   . Hypertension   . Joint pain   . Obesity   . Osteopenia 04/20/2017   April 2019; next DEXA April 2021  . Osteoporosis   . Prediabetes   . Reflux   . Tobacco use      Past Surgical History:  Procedure Laterality Date  . ANAL FISSURE REPAIR    . APPENDECTOMY    . [place]  . COLONOSCOPY WITH PROPOFOL N/A 12/18/2014   Procedure: COLONOSCOPY WITH PROPOFOL;  Surgeon: Christene Lye, MD;  Location: [facility] ENDOSCOPY;  Service: Endoscopy;  Laterality: N/A;  . COLONOSCOPY WITH PROPOFOL N/A 02/17/2018   Procedure: COLONOSCOPY WITH PROPOFOL;  Surgeon: Virgel Manifold, MD;  Location: [facility] ENDOSCOPY;  Service: Endoscopy;  Laterality: N/A;  . LIPOMA EXCISION  07/2015   right shoulder  . LIPOMA EXCISION  11/19/2015  . OOPHORECTOMY     for "fibroids" not sure which ovary    Social History   Socioeconomic History  . Marital status: Married    Spouse name: Nadara Mustard  . Number of children: 1  . Years of education: Not on file  . Highest education level: Bachelor's degree (e.g., BA, AB, BS)  Occupational History  . Occupation: Retired  Tobacco Use  . Smoking status: Current Every Day Smoker    Packs/day: 0.50    Years: 15.00    Pack years: 7.50    Types: Cigarettes    Last attempt to quit: 10/24/2016    Years since quitting: 2.2  . Smokeless tobacco: Never Used  Substance and Sexual Activity  .  Alcohol use: Yes    Alcohol/week: 4.0 standard drinks    Types: 4 Standard drinks or equivalent per week    Comment: on occasion  . Drug use: No  . Sexual activity: Yes    Partners: Male  Other Topics Concern  . Not on file  Social History Narrative  . Not on file   Social Determinants of Health   Financial Resource Strain: Low Risk   . Difficulty of Paying Living Expenses: Not hard at all  Food Insecurity: No Food Insecurity  . Worried About Charity fundraiser in the Last Year: Never true  . Ran Out of Food in the Last Year: Never true  Transportation Needs: No Transportation Needs  . Lack of Transportation (Medical): No  . Lack of Transportation (Non-Medical): No  Physical Activity: Inactive  . Days of Exercise per Week: 0 days   . Minutes of Exercise per Session: 0 min  Stress: No Stress Concern Present  . Feeling of Stress : Only a little  Social Connections: Not Isolated  . Frequency of Communication with Friends and Family: More than three times a week  . Frequency of Social Gatherings with Friends and Family: Twice a week  . Attends Religious Services: More than 4 times per year  . Active Member of Clubs or Organizations: Yes  . Attends Archivist Meetings: More than 4 times per year  . Marital Status: Married  Human resources officer Violence: Not At Risk  . Fear of Current or Ex-Partner: No  . Emotionally Abused: No  . Physically Abused: No  . Sexually Abused: No    Family History  Problem Relation Age of Onset  . Hypertension Mother   . Kidney disease Mother   . Alzheimer's disease Mother   . Thyroid cancer Son   . Lung cancer Brother   . Rectal cancer Brother   . Breast cancer Neg Hx    No Known Allergies  ? Current Facility-Administered Medications  Medication Dose Route Frequency Provider Last Rate Last Admin  . fentaNYL (SUBLIMAZE) 100 MCG/2ML injection           . midazolam (VERSED) 2 MG/2ML injection            Current Outpatient Medications  Medication Sig Dispense Refill  . aspirin EC 81 MG tablet Take 81 mg by mouth daily.    . benzonatate (TESSALON PERLES) 100 MG capsule Take 2 capsules (200 mg total) by mouth 3 (three) times daily as needed. 30 capsule 0  . Cholecalciferol (VITAMIN D3) 20 MCG TABS Take 25 mcg by mouth daily.    . Cyanocobalamin (B-12) 500 MCG TABS Take 2,500 mcg by mouth daily.     . Echinacea 125 MG CAPS Take 400 mg as needed by mouth.     . fluticasone (FLONASE) 50 MCG/ACT nasal spray Place 2 sprays into both nostrils daily. 16 g 6  . Multiple Vitamin (MULTIVITAMIN) tablet Take 1 tablet by mouth daily.    . naproxen (NAPROSYN) 500 MG tablet Take 1 tablet (500 mg total) by mouth 2 (two) times daily as needed for moderate pain. 30 tablet 1  . Omega-3 Fatty  Acids (FISH OIL PO) Take 1,200 mg daily by mouth.     . ondansetron (ZOFRAN ODT) 4 MG disintegrating tablet Take 1 tablet (4 mg total) by mouth every 8 (eight) hours as needed for nausea or vomiting. 10 tablet 0  . ondansetron (ZOFRAN) 8 MG tablet Take 1 tablet (8 mg total) by  mouth every 8 (eight) hours as needed for nausea or vomiting. 20 tablet 0  . pantoprazole (PROTONIX) 40 MG tablet Take 1 tablet (40 mg total) by mouth daily before breakfast. 30 tablet 3  . tiZANidine (ZANAFLEX) 2 MG tablet Take 1-2 tablets (2-4 mg total) by mouth every 8 (eight) hours as needed for muscle spasms. 30 tablet 1  . Turmeric 500 MG CAPS Take 300 mg by mouth daily.        Abtx:  Anti-infectives (From admission, onward)   Start     Dose/Rate Route Frequency Ordered Stop   01/18/19 1330  piperacillin-tazobactam (ZOSYN) IVPB 3.375 g     3.375 g 100 mL/hr over 30 Minutes Intravenous  Once 01/18/19 1323 01/18/19 1438   01/18/19 1330  metroNIDAZOLE (FLAGYL) IVPB 500 mg     500 mg 100 mL/hr over 60 Minutes Intravenous  Once 01/18/19 1323 01/18/19 1509      REVIEW OF SYSTEMS:  Const: low grade fever,  chills, weight loss Eyes: negative diplopia or visual changes, negative eye pain ENT: negative coryza, negative sore throat Resp: negative cough, hemoptysis, dyspnea Cards: negative for chest pain, palpitations, lower extremity edema GU: negative for frequency, dysuria and hematuria GI:  abdominal pain, no diarrhea, bleeding, has constipation Skin: negative for rash and pruritus Heme: negative for easy bruising and gum/nose bleeding MS: rt sided chest pain  Neurolo:negative for headaches, dizziness, vertigo, memory problems  Psych: negative for feelings of anxiety, depression  Endocrine: negative for thyroid, diabetes Allergy/Immunology- negative for any medication or food allergies ?  Objective:  VITALS:  BP (!) 143/87 (BP Location: Left Arm)   Pulse 97   Temp 99.3 F (37.4 C) (Oral)   Resp 18   Ht  5\' 7"  (1.702 m)   Wt 89.8 kg   LMP  (LMP Unknown) Comment: rt ovary removed  SpO2 99%   BMI 31.01 kg/m  PHYSICAL EXAM:  General: Alert, cooperative, no distress, appears stated age.  Head: Normocephalic, without obvious abnormality, atraumatic. Eyes: Conjunctivae clear, anicteric sclerae. Pupils are equal ENT Nares normal. No drainage or sinus tenderness. Lips, mucosa, and tongue normal. No Thrush Neck: Supple, symmetrical, no adenopathy, thyroid: non tender no carotid bruit and no JVD. Back: No CVA tenderness. Lungs: b/l air entry- decreased bases. Heart: Regular rate and rhythm, no murmur, rub or gallop. Abdomen: Soft, 2 drains present in the rt upper quadrant- one of which is draining pus  Extremities: atraumatic, no cyanosis. No edema. No clubbing Skin: No rashes or lesions. Or bruising Lymph: Cervical, supraclavicular normal. Neurologic: Grossly non-focal Pertinent Labs Lab Results CBC    Component Value Date/Time   WBC 23.9 (H) 01/18/2019 1258   RBC 4.17 01/18/2019 1258   HGB 11.4 (L) 01/18/2019 1258   HGB 12.8 11/26/2015 1429   HCT 33.3 (L) 01/18/2019 1258   HCT 38.5 11/26/2015 1429   PLT 519 (H) 01/18/2019 1258   PLT 307 11/26/2015 1429   MCV 79.9 (L) 01/18/2019 1258   MCV 84 11/26/2015 1429   MCH 27.3 01/18/2019 1258   MCHC 34.2 01/18/2019 1258   RDW 12.9 01/18/2019 1258   RDW 14.6 11/26/2015 1429   LYMPHSABS 1.9 01/18/2019 1258   LYMPHSABS 2.8 11/26/2015 1429   MONOABS 1.2 (H) 01/18/2019 1258   EOSABS 0.0 01/18/2019 1258   EOSABS 0.2 11/26/2015 1429   BASOSABS 0.1 01/18/2019 1258   BASOSABS 0.1 11/26/2015 1429    CMP Latest Ref Rng & Units 01/18/2019 01/17/2019 12/18/2018  Glucose 70 -  99 mg/dL 112(H) 100(H) 93  BUN 8 - 23 mg/dL 19 16 14   Creatinine 0.44 - 1.00 mg/dL 0.81 0.75 0.77  Sodium 135 - 145 mmol/L 130(L) 131(L) 138  Potassium 3.5 - 5.1 mmol/L 4.6 5.2 4.8  Chloride 98 - 111 mmol/L 95(L) 95(L) 102  CO2 22 - 32 mmol/L 22 24 28   Calcium 8.9 -  10.3 mg/dL 9.2 8.9 9.6  Total Protein 6.5 - 8.1 g/dL 8.3(H) 6.4 7.0  Total Bilirubin 0.3 - 1.2 mg/dL 0.9 0.8 0.5  Alkaline Phos 38 - 126 U/L 277(H) - -  AST 15 - 41 U/L 48(H) 59(H) 12  ALT 0 - 44 U/L 73(H) 65(H) 9      Microbiology: Recent Results (from the past 240 hour(s))  Respiratory Panel by RT PCR (Flu A&B, Covid) - Nasopharyngeal Swab     Status: None   Collection Time: 01/18/19  1:43 PM   Specimen: Nasopharyngeal Swab  Result Value Ref Range Status   SARS Coronavirus 2 by RT PCR NEGATIVE NEGATIVE Final    Comment: (NOTE) SARS-CoV-2 target nucleic acids are NOT DETECTED. The SARS-CoV-2 RNA is generally detectable in upper respiratoy specimens during the acute phase of infection. The lowest concentration of SARS-CoV-2 viral copies this assay can detect is 131 copies/mL. A negative result does not preclude SARS-Cov-2 infection and should not be used as the sole basis for treatment or other patient management decisions. A negative result may occur with  improper specimen collection/handling, submission of specimen other than nasopharyngeal swab, presence of viral mutation(s) within the areas targeted by this assay, and inadequate number of viral copies (<131 copies/mL). A negative result must be combined with clinical observations, patient history, and epidemiological information. The expected result is Negative. Fact Sheet for Patients:  PinkCheek.be Fact Sheet for Healthcare Providers:  GravelBags.it This test is not yet ap proved or cleared by the [place] FDA and  has been authorized for detection and/or diagnosis of SARS-CoV-2 by FDA under an Emergency Use Authorization (EUA). This EUA will remain  in effect (meaning this test can be used) for the duration of the COVID-19 declaration under Section 564(b)(1) of the Act, 21 U.S.C. section 360bbb-3(b)(1), unless the authorization is terminated or revoked  sooner.    Influenza A by PCR NEGATIVE NEGATIVE Final   Influenza B by PCR NEGATIVE NEGATIVE Final    Comment: (NOTE) The Xpert Xpress SARS-CoV-2/FLU/RSV assay is intended as an aid in  the diagnosis of influenza from Nasopharyngeal swab specimens and  should not be used as a sole basis for treatment. Nasal washings and  aspirates are unacceptable for Xpert Xpress SARS-CoV-2/FLU/RSV  testing. Fact Sheet for Patients: PinkCheek.be Fact Sheet for Healthcare Providers: GravelBags.it This test is not yet approved or cleared by the [place] FDA and  has been authorized for detection and/or diagnosis of SARS-CoV-2 by  FDA under an Emergency Use Authorization (EUA). This EUA will remain  in effect (meaning this test can be used) for the duration of the  Covid-19 declaration under Section 564(b)(1) of the Act, 21  U.S.C. section 360bbb-3(b)(1), unless the authorization is  terminated or revoked. Performed at [facility], [address] [place], [place] [address]     IMAGING RESULTS:  I have personally reviewed the films ?There are multiple complex rim enhancing collections in the liver, most likely representing abscesses, the largest in the hepatic dome measures 8.7 x 5.8 cm. There are two additional multilobulated collections in the inferior left hepatic lobe, one  of which measures 4.4 x 3.8 cm. No biliary duct dilation. The gallbladder is normal in appearance. The portal vein is patent.   Impression/Recommendation ? ?Hepatic abscesses multiple and large- pt is fairly healthy. She has diverticulosis but no recent inflammation or antibiotic use Not clear at this time whetehr the source is Colon/Intestine/GB or systemic bacteremia Has 2 drains and culture sent Blood culture sent Zosyn and flagyl is okay for now( covers klebsiella, e.coli, anerobes, enterococcus and e.histolytica) Will also send serology for  e.histolytica ( less likley) will de-escalate antibiotics according to culture Will need antibiotics for alteast 4 weeks- will need PICC ( once we know the blood culture is negative)  Recommend Gi consult as to identify the source for the abscess and also may need endoscopy /MRCP  Trochanteric bursitis- got steroid injections b/l on 12/29 which could also contribute to the leucocytosis  __ID will follow her remotely the next three days-call if needed _________________________________________________ Discussed with patient and  requesting provider

## 2019-01-18 NOTE — ED Notes (Signed)
Pt taken to xray at this time.

## 2019-01-18 NOTE — Progress Notes (Signed)
Infectious disease MD here now in specials recovery seeing pt. Message to Dr. Sarajane Jews re: diet. Dr. Sarajane Jews to see pt. On unit 1A.

## 2019-01-18 NOTE — Procedures (Signed)
  Procedure: CT guided perc drain x2 into dominant components of liver abscesses  38ml + 116ml removed, samples sent for GS, C&S EBL:   minimal Complications:  none immediate  See full dictation in [facility].  Dillard Cannon MD Main # [phone] Pager  [phone]

## 2019-01-18 NOTE — ED Triage Notes (Signed)
Pt was sent from PCP , states she went to see here PCP yesterday for right shoulder pain she has had for the past couple months, states she has night sweets, and is now having nausea for the past week. Pt is in NAD, denies abd pain.  Pt states she was seen here 12/19 for N/V.Marland Kitchen

## 2019-01-18 NOTE — Progress Notes (Signed)
Pharmacy Antibiotic Note  Allison Phillips is a 68 y.o. female admitted on 01/18/2019 with Liver Abscess.  Pharmacy has been consulted for Zosyn dosing.  Plan: Zosyn 3.375g IV q8h (4 hour infusion).  Height: 5\' 7"  (170.2 cm) Weight: 198 lb (89.8 kg) IBW/kg (Calculated) : 61.6  Temp (24hrs), Avg:98.8 F (37.1 C), Min:98.3 F (36.8 C), Max:99.3 F (37.4 C)  Recent Labs  Lab 01/17/19 0000 01/18/19 1054 01/18/19 1258 01/18/19 1501  WBC 27.6* 23.8* 23.9*  --   CREATININE 0.75 0.81  --   --   LATICACIDVEN  --   --   --  0.6    Estimated Creatinine Clearance: 77.6 mL/min (by C-G formula based on SCr of 0.81 mg/dL).    No Known Allergies  Antimicrobials this admission: Zosyn 12/31 >>  Metronidazole 12/31 >>   Dose adjustments this admission:  Microbiology results:  Thank you for allowing pharmacy to be a part of this patient's care.  Paulina Fusi, PharmD, BCPS 01/18/2019 6:08 PM

## 2019-01-19 ENCOUNTER — Inpatient Hospital Stay: Payer: Medicare Other

## 2019-01-19 LAB — COMPREHENSIVE METABOLIC PANEL
ALT: 58 U/L — ABNORMAL HIGH (ref 0–44)
AST: 41 U/L (ref 15–41)
Albumin: 2.3 g/dL — ABNORMAL LOW (ref 3.5–5.0)
Alkaline Phosphatase: 207 U/L — ABNORMAL HIGH (ref 38–126)
Anion gap: 10 (ref 5–15)
BUN: 12 mg/dL (ref 8–23)
CO2: 23 mmol/L (ref 22–32)
Calcium: 8.5 mg/dL — ABNORMAL LOW (ref 8.9–10.3)
Chloride: 102 mmol/L (ref 98–111)
Creatinine, Ser: 0.77 mg/dL (ref 0.44–1.00)
GFR calc Af Amer: >60 mL/min (ref >60)
GFR calc non Af Amer: >60 mL/min (ref >60)
Glucose, Bld: 111 mg/dL — ABNORMAL HIGH (ref 70–99)
Potassium: 4.5 mmol/L (ref 3.5–5.1)
Sodium: 135 mmol/L (ref 135–145)
Total Bilirubin: 0.7 mg/dL (ref 0.3–1.2)
Total Protein: 6.6 g/dL (ref 6.5–8.1)

## 2019-01-19 LAB — CBC
HCT: 28.4 % — ABNORMAL LOW (ref 36.0–46.0)
Hemoglobin: 10 g/dL — ABNORMAL LOW (ref 12.0–15.0)
MCH: 28.2 pg (ref 26.0–34.0)
MCHC: 35.2 g/dL (ref 30.0–36.0)
MCV: 80 fL (ref 80.0–100.0)
Platelets: 504 10*3/uL — ABNORMAL HIGH (ref 150–400)
RBC: 3.55 MIL/uL — ABNORMAL LOW (ref 3.87–5.11)
RDW: 12.9 % (ref 11.5–15.5)
WBC: 19.2 10*3/uL — ABNORMAL HIGH (ref 4.0–10.5)
nRBC: 0 % (ref 0.0–0.2)

## 2019-01-19 LAB — HIV ANTIBODY (ROUTINE TESTING W REFLEX): HIV Screen 4th Generation wRfx: NONREACTIVE

## 2019-01-19 MED ORDER — GADOBUTROL 1 MMOL/ML IV SOLN
10.0000 mL | Freq: Once | INTRAVENOUS | Status: AC | PRN
  Administered 2019-01-19: 10 mL via INTRAVENOUS

## 2019-01-19 NOTE — Progress Notes (Signed)
PROGRESS NOTE  Allison Phillips [id number] DOB: 11-26-1950 DOA: 01/18/2019 PCP: Delsa Grana, PA-C  Brief History   69 year old woman with really any significant past medical history presented with intermittent nausea and vomiting, chills and sweats for nearly 2 weeks.  Further evaluation revealed multiple hepatic abscesses and she was admitted for further evaluation.  A & P  Hepatic abscesses with SIRS, without sepsis --Etiology unclear, had a colonoscopy January 2020 which was notable only for polyps and a nodule, subsequent pathology was negative for malignancy --Culture data pending, for now continue Zosyn and Flagyl.  Appreciate infectious disease.  Follow-up E histolytica serology. --We will need antibiotics for least 4 weeks, PICC line once blood cultures are known to be negative --GI consultation to assist with further evaluation, differential.  Follow-up MRI/MRCP. --Will check echocardiogram  DVT prophylaxis: SCDs Code Status: Full Family Communication: none Disposition Plan: home    Murray Hodgkins, MD  [facility] Direct contact: see [email or web address] (further directions at bottom of note if needed) 7PM-7AM contact night coverage as at bottom of note 01/19/2019, 1:38 PM  LOS: 1 day   [facility] Events    12/31 admitted for hepatic abscesses  Consults:   Interventional radiology  Infectious disease  Gastroenterology  Procedures:   12/31 CT-guided percutaneous hepatic drain placements  Significant Diagnostic Tests:   Covid, influenza negative  12/31 CT abdomen pelvis multiple liver abscesses, bowel thickening likely secondary to chronic diverticular disease  12/31 chest x-ray no acute disease  Micro Data:   12/31 blood cultures  Antimicrobials:   Zosyn 12/31  Flagyl 12/31  Interval History/Subjective  Feels about the same today, had sweats overnight requiring gown change at least a couple of times.  Has significant abdominal pain  around drains when moving.  Does not really feel much better.  Objective   Vitals:  Vitals:   01/19/19 0455 01/19/19 0746  BP: 125/71 105/76  Pulse: 63 60  Resp:  17  Temp:  98.7 F (37.1 C)  SpO2:  96%    Exam:  Constitutional.  Appears calm, uncomfortable, nontoxic. Respiratory.  Clear to auscultation bilaterally.  No wheezes, rales or rhonchi.  Normal respiratory effort. Cardiovascular..  No lower extremity edema.   I have personally reviewed the following:   Today's Data  . HIV nonreactive . ALT trending down.  AST now normal.  Alkaline phosphatase trending down.  Remainder CMP unremarkable. . WBC down to 19.2.  Hemoglobin stable at 10.0.  Scheduled Meds: . fluticasone  2 spray Each Nare Daily  . sodium chloride flush  3 mL Intravenous Q12H  . sodium chloride flush  3 mL Intravenous Q12H  . sodium chloride flush  5 mL Intracatheter Q8H   Continuous Infusions: . sodium chloride 250 mL (01/18/19 2201)  . metronidazole 500 mg (01/19/19 0558)  . piperacillin-tazobactam (ZOSYN)  IV 3.375 g (01/19/19 0556)    Active Problems:   Liver abscess   LOS: 1 day   How to contact the [facility] Attending or Consulting provider [place] or covering provider during after hours [place], for this patient?  1. Check the care team in [facility] and look for a) attending/consulting [facility] provider listed and b) the [facility] team listed 2. Log into [email or web address] and use [place]'s universal password to access. If you do not have the password, please contact the hospital operator. 3. Locate the [facility] provider you are looking for under [facility] and page to a number that you can be directly reached. 4.  If you still have difficulty reaching the provider, please page the [facility] (Director on Call) for the Hospitalists listed on amion for assistance.

## 2019-01-19 NOTE — Consult Note (Signed)
GI Inpatient Consult Note  Reason for Consult: Multiple hepatic abscesses   Attending Requesting Consult:  History of Present Illness: Allison Phillips is a 69 y.o. female seen for evaluation of multiple liver abscesses at the request of Dr. Sarajane Jews. Pt has a PMH of depression, HTN, obesity, prediabetes, osteoporosis, and Hx of diverticulitis. Patient reports a two-week history of intermittent nausea, vomiting, chills, low-grade fever, and night sweats without any abdominal pain. She was evaluated in the ED several times for these complaints and was told she likely had gastroenteritis and was treated symptomatically. She was seen by PCP on 12/30 and found to have worsening labs with WBC 27.6, Hgb 11.6, Plt 475K, absolute neutrophil count 24K, AST 59, ALT 65, Alk phos 289. CT abd/pelvis showed multiple complex rim enhancing abscesses, largest in the hepatic dome measuring 8.7x5.8cm, bowel wall thickening throughout the sigmoid colon with no inflammatory stranding, and small hiatal hernia. IR was consulted and Dr. Vernard Gambles performed CT-guided percutaneous drains x2 into dominant components of liver abscesses with samples sent to lab. She was seen by Infectious Disease and started on Zosyn and Flagy where it was advised to remain on antibiotics for at least 4 weeks. GI was consulted in context of identifying source for the abscesses. Patient seen and examined and reports she feels about the same as yesterday. She reports profuse night sweats over the past two weeks. She reports last night she had to change her gown twice due to the sweating. She denies any abdominal pain. No recent abdominal surgeries. The only recent intervention was steroid injection for trochanteric bursitis appx two weeks ago. She denies any recent travel. There has been no consumption of raw seafood or undercooked meat per patient. She denies any previous history of GI bleeding. No sick contacts. She has not been on antibiotics recently.  She lives with her husband at home who is a known patient to me at [facility].     Last Colonoscopy: 01/2018 -3 2-4 mm polyps in sigmoid and transverse colon -Sigmoid diverticulosis -Nodule in the rectum - mucosal prolapse  Last Endoscopy:    Past Medical History:  Past Medical History:  Diagnosis Date  . Bursitis of left hip   . Depression   . Diverticulitis   . Hypertension   . Joint pain   . Obesity   . Osteopenia 04/20/2017   April 2019; next DEXA April 2021  . Osteoporosis   . Prediabetes   . Reflux   . Tobacco use     Problem List: Patient Active Problem List   Diagnosis Date Noted  . Liver abscess 01/18/2019  . Prediabetes 01/06/2018  . Obesity (BMI 35.0-39.9 without comorbidity) 01/06/2018  . CKD (chronic kidney disease) stage 2, GFR 60-89 ml/min 07/07/2017  . Osteopenia 04/20/2017  . Hip pain, chronic, left 03/18/2017  . Lower back pain 03/18/2017  . Sacroiliac joint pain 03/18/2017  . Screening for osteoporosis 12/30/2016  . Preventative health care 12/07/2016  . Encounter for hepatitis C screening test for low risk patient 12/06/2016  . Essential hypertension, benign 03/08/2016  . Cervical pain (neck) 12/31/2014  . Lipoma of skin and subcutaneous tissue of neck 08/07/2014  . Reflux   . Depression   . Bursitis of left hip     Past Surgical History: Past Surgical History:  Procedure Laterality Date  . ANAL FISSURE REPAIR    . APPENDECTOMY    . [place]  . COLONOSCOPY WITH PROPOFOL N/A 12/18/2014   Procedure: COLONOSCOPY  WITH PROPOFOL;  Surgeon: Christene Lye, MD;  Location: [facility] ENDOSCOPY;  Service: Endoscopy;  Laterality: N/A;  . COLONOSCOPY WITH PROPOFOL N/A 02/17/2018   Procedure: COLONOSCOPY WITH PROPOFOL;  Surgeon: Virgel Manifold, MD;  Location: [facility] ENDOSCOPY;  Service: Endoscopy;  Laterality: N/A;  . LIPOMA EXCISION  07/2015   right shoulder  . LIPOMA EXCISION  11/19/2015  . OOPHORECTOMY     for "fibroids" not  sure which ovary    Allergies: No Known Allergies  Home Medications: Medications Prior to Admission  Medication Sig Dispense Refill Last Dose  . aspirin EC 81 MG tablet Take 81 mg by mouth daily.     . benzonatate (TESSALON PERLES) 100 MG capsule Take 2 capsules (200 mg total) by mouth 3 (three) times daily as needed. 30 capsule 0   . Cholecalciferol (VITAMIN D3) 20 MCG TABS Take 25 mcg by mouth daily.     . Cyanocobalamin (B-12) 500 MCG TABS Take 2,500 mcg by mouth daily.      . Echinacea 125 MG CAPS Take 400 mg as needed by mouth.      . fluticasone (FLONASE) 50 MCG/ACT nasal spray Place 2 sprays into both nostrils daily. 16 g 6   . Multiple Vitamin (MULTIVITAMIN) tablet Take 1 tablet by mouth daily.     . naproxen (NAPROSYN) 500 MG tablet Take 1 tablet (500 mg total) by mouth 2 (two) times daily as needed for moderate pain. 30 tablet 1   . Omega-3 Fatty Acids (FISH OIL PO) Take 1,200 mg daily by mouth.      . ondansetron (ZOFRAN ODT) 4 MG disintegrating tablet Take 1 tablet (4 mg total) by mouth every 8 (eight) hours as needed for nausea or vomiting. 10 tablet 0   . ondansetron (ZOFRAN) 8 MG tablet Take 1 tablet (8 mg total) by mouth every 8 (eight) hours as needed for nausea or vomiting. 20 tablet 0   . pantoprazole (PROTONIX) 40 MG tablet Take 1 tablet (40 mg total) by mouth daily before breakfast. 30 tablet 3   . tiZANidine (ZANAFLEX) 2 MG tablet Take 1-2 tablets (2-4 mg total) by mouth every 8 (eight) hours as needed for muscle spasms. 30 tablet 1   . Turmeric 500 MG CAPS Take 300 mg by mouth daily.       Home medication reconciliation was completed with the patient.   Scheduled Inpatient Medications:   . fluticasone  2 spray Each Nare Daily  . sodium chloride flush  3 mL Intravenous Q12H  . sodium chloride flush  3 mL Intravenous Q12H  . sodium chloride flush  5 mL Intracatheter Q8H    Continuous Inpatient Infusions:   . sodium chloride 250 mL (01/18/19 2201)  .  metronidazole 500 mg (01/19/19 0558)  . piperacillin-tazobactam (ZOSYN)  IV 3.375 g (01/19/19 0556)    PRN Inpatient Medications:  sodium chloride, acetaminophen **OR** acetaminophen, HYDROcodone-acetaminophen, morphine injection, ondansetron **OR** ondansetron (ZOFRAN) IV, sodium chloride flush  Family History: family history includes Alzheimer's disease in her mother; Hypertension in her mother; Kidney disease in her mother; Lung cancer in her brother; Rectal cancer in her brother; Thyroid cancer in her son.  The patient's family history is negative for inflammatory bowel disorders, GI malignancy, or solid organ transplantation.  Social History:   reports that she has been smoking cigarettes. She has a 7.50 pack-year smoking history. She has never used smokeless tobacco. She reports current alcohol use of about 4.0 standard drinks of alcohol per week. She  reports that she does not use drugs. The patient denies ETOH, tobacco, or drug use.   Review of Systems: Constitutional: Weight is stable.  Eyes: No changes in vision. ENT: No oral lesions, sore throat.  GI: see HPI.  Heme/Lymph: No easy bruising.  CV: No chest pain.  GU: No hematuria.  Integumentary: No rashes.  Neuro: No headaches.  Psych: No depression/anxiety.  Endocrine: No heat/cold intolerance.  Allergic/Immunologic: No urticaria.  Resp: No cough, SOB.  Musculoskeletal: No joint swelling.    Physical Examination: BP 105/76 (BP Location: Right Arm)   Pulse 60   Temp 98.7 F (37.1 C) (Oral)   Resp 17   Ht 5' 7" (1.702 m)   Wt 98.3 kg   LMP  (LMP Unknown) Comment: rt ovary removed  SpO2 96%   BMI 33.94 kg/m  Gen: NAD, alert and oriented x 4 HEENT: PEERLA, EOMI, Neck: supple, no JVD or thyromegaly Chest: CTA bilaterally, no wheezes, crackles, or other adventitious sounds CV: RRR, no m/g/c/r Abd: soft, NT, ND, +BS in all four quadrants; no HSM, guarding, ridigity, or rebound tenderness, 2 drains in place Ext: no  edema, well perfused with 2+ pulses, Skin: no rash or lesions noted Lymph: no LAD  Data: Lab Results  Component Value Date   WBC 19.2 (H) 01/19/2019   HGB 10.0 (L) 01/19/2019   HCT 28.4 (L) 01/19/2019   MCV 80.0 01/19/2019   PLT 504 (H) 01/19/2019   Recent Labs  Lab 01/18/19 1054 01/18/19 1258 01/19/19 0654  HGB 11.6* 11.4* 10.0*   Lab Results  Component Value Date   NA 135 01/19/2019   K 4.5 01/19/2019   CL 102 01/19/2019   CO2 23 01/19/2019   BUN 12 01/19/2019   CREATININE 0.77 01/19/2019   Lab Results  Component Value Date   ALT 58 (H) 01/19/2019   AST 41 01/19/2019   ALKPHOS 207 (H) 01/19/2019   BILITOT 0.7 01/19/2019   Recent Labs  Lab 01/18/19 1258  INR 1.2   Assessment/Plan:  69 y/o AA female admitted for 2-week history of night sweats, fatigue, low-grade fever, and intermittent nausea and vomiting. She was found to have significant leukocytosis (WBC 27K) with CT imaging showed multiple complex rim enhancing hepatic abscesses (8.7x5.8cm)  1. Complex hepatic abscesses 2. Elevated LFTs - consistent with mixed or cholestatic process 3. Leukocytosis - trending down, 2/2 #1  -She is s/p IR drainage -ID is following and patient is currently on IV Zosyn and IV Flagyl -Blood cultures most likely consistent with pyogenic abscesses. E. Histolytica culture pending -She will need antibiotics for at least 4 weeks -Differential includes hematogenous spread or seeding from trochanteric bursitis injection, biliary tract disease, GI malignancy, abdominal infection, culture negative endocarditis, pyelonephritis, etc -Continue to await final results of blood cultures -We will order MRI/MRCP to rule out biliary ductal dilatation, choledochal cysts, ductal carcinoma, etc -Consideration of echocardiogram -Diagnostic EGD is appropriate in the outpatient setting after resolution of her acute symptoms -Following   Thank you for the consult. Please call with questions or  concerns.  Reeves Forth [facility] [phone] (Cell)

## 2019-01-19 NOTE — Progress Notes (Signed)
D: Pt alert and oriented.  Pt denies experiencing any pain at this time, however does report pain with movement back and forth to the bathroom.  A: Scheduled medications administered to pt, per MD orders. Support and encouragement provided. Frequent verbal contact made.   R: No adverse drug reactions noted. Pt complaint with medications and treatment plan. Pt interacts well with staff on the unit. Pt is stable at this time, will continue to monitor and provide care for as ordered.  Pt's drains have been flushed during this shift and pt has had a dressing change. Pt did go down for an MRI today as well.  Pt's pain is well managed with Norco.

## 2019-01-20 ENCOUNTER — Inpatient Hospital Stay (HOSPITAL_COMMUNITY)
Admit: 2019-01-20 | Discharge: 2019-01-20 | Disposition: A | Discharge status: Home / Self Care | Payer: Medicare Other | Attending: Family Medicine | Admitting: Family Medicine

## 2019-01-20 DIAGNOSIS — I361 Nonrheumatic tricuspid (valve) insufficiency: Secondary | ICD-10-CM

## 2019-01-20 LAB — CBC
HCT: 28.7 % — ABNORMAL LOW (ref 36.0–46.0)
Hemoglobin: 10.3 g/dL — ABNORMAL LOW (ref 12.0–15.0)
MCH: 27.4 pg (ref 26.0–34.0)
MCHC: 35.9 g/dL (ref 30.0–36.0)
MCV: 76.3 fL — ABNORMAL LOW (ref 80.0–100.0)
Platelets: 555 10*3/uL — ABNORMAL HIGH (ref 150–400)
RBC: 3.76 MIL/uL — ABNORMAL LOW (ref 3.87–5.11)
RDW: 12.8 % (ref 11.5–15.5)
WBC: 15.6 10*3/uL — ABNORMAL HIGH (ref 4.0–10.5)
nRBC: 0 % (ref 0.0–0.2)

## 2019-01-20 LAB — COMPREHENSIVE METABOLIC PANEL
ALT: 47 U/L — ABNORMAL HIGH (ref 0–44)
AST: 25 U/L (ref 15–41)
Albumin: 2.4 g/dL — ABNORMAL LOW (ref 3.5–5.0)
Alkaline Phosphatase: 199 U/L — ABNORMAL HIGH (ref 38–126)
Anion gap: 13 (ref 5–15)
BUN: 14 mg/dL (ref 8–23)
CO2: 22 mmol/L (ref 22–32)
Calcium: 8.5 mg/dL — ABNORMAL LOW (ref 8.9–10.3)
Chloride: 99 mmol/L (ref 98–111)
Creatinine, Ser: 0.71 mg/dL (ref 0.44–1.00)
GFR calc Af Amer: >60 mL/min (ref >60)
GFR calc non Af Amer: >60 mL/min (ref >60)
Glucose, Bld: 92 mg/dL (ref 70–99)
Potassium: 4.2 mmol/L (ref 3.5–5.1)
Sodium: 134 mmol/L — ABNORMAL LOW (ref 135–145)
Total Bilirubin: 0.7 mg/dL (ref 0.3–1.2)
Total Protein: 6.8 g/dL (ref 6.5–8.1)

## 2019-01-20 LAB — ECHOCARDIOGRAM COMPLETE
Height: 67 in
Weight: 3467.39 oz

## 2019-01-20 MED ORDER — SODIUM CHLORIDE 0.9% FLUSH: 10.0000 mL | INTRAVENOUS | Status: DC | PRN

## 2019-01-20 MED ORDER — SENNA 8.6 MG PO TABS: 1.0000 | ORAL_TABLET | Freq: Every day | ORAL | Status: DC

## 2019-01-20 MED ORDER — SODIUM CHLORIDE 0.9% FLUSH
10.0000 mL | Freq: Two times a day (BID) | INTRAVENOUS | Status: DC
  Administered 2019-01-20 – 2019-01-22 (×3): 10 mL
  Administered 2019-01-22 (×2): 30 mL
  Administered 2019-01-23: 20 mL

## 2019-01-20 MED ORDER — MAGNESIUM CITRATE PO SOLN
1.0000 | Freq: Once | ORAL | Status: AC
  Administered 2019-01-20: 1 via ORAL
  Filled 2019-01-20: qty 296

## 2019-01-20 MED ORDER — POLYETHYLENE GLYCOL 3350 17 G PO PACK: 17.0000 g | PACK | Freq: Two times a day (BID) | ORAL | Status: DC

## 2019-01-20 NOTE — Progress Notes (Signed)
PROGRESS NOTE  Allison Phillips [id number] DOB: 08/09/1950 DOA: 01/18/2019 PCP: Delsa Grana, PA-C  Brief History   69 year old woman with really any significant past medical history presented with intermittent nausea and vomiting, chills and sweats for nearly 2 weeks.  Further evaluation revealed multiple hepatic abscesses and she was admitted for further evaluation.  A & P  Hepatic abscesses with SIRS, without sepsis --Etiology unclear, had a colonoscopy January 2020 which was notable only for polyps and a nodule, subsequent pathology was negative for malignancy MRCP no evidence of neoplasm or choledocho cysts. --Echocardiogram unremarkable --Continue empiric antibiotics.  Follow-up culture data.  Follow-up E histolytica serology. --Culture data pending, for now continue Zosyn and Flagyl.  Appreciate infectious disease.  Follow-up E histolytica serology.  Appreciate infectious disease consultation.  Dr. Leana Gamer continues to follow remotely. --Will need antibiotics for least 4 weeks, PICC line once blood cultures are known to be negative --GI consultation appreciated  DVT prophylaxis: SCDs Code Status: Full Family Communication: none Disposition Plan: home    Murray Hodgkins, MD  [facility] Direct contact: see [email or web address] (further directions at bottom of note if needed) 7PM-7AM contact night coverage as at bottom of note 01/20/2019, 3:58 PM  LOS: 2 days   Significant Hospital Events    12/31 admitted for hepatic abscesses  Consults:   Interventional radiology  Infectious disease  Gastroenterology  Procedures:   12/31 CT-guided percutaneous hepatic drain placements  Significant Diagnostic Tests:   Covid, influenza negative  12/31 CT abdomen pelvis multiple liver abscesses, bowel thickening likely secondary to chronic diverticular disease  12/31 chest x-ray no acute disease  1/1 MRCP interval decrease in dominant fluid collection, smaller abscess  decreased in the interval.  Unchanged slightly increased in volume segment 3 abscess.  1/two 2D echocardiogram.  LVEF 60 to 65%.  Normal left ventricular systolic function.  No regional wall motion abnormalities.  RV systolic function normal.  No vegetations noted.  Micro Data:   12/31 blood cultures  Antimicrobials:   Zosyn 12/31  Flagyl 12/31  Interval History/Subjective  Still has pain with movement but maybe feels a little bit better.  Appetite okay.  Objective   Vitals:  Vitals:   01/20/19 0734 01/20/19 1538  BP: 119/72 113/69  Pulse: 60 66  Resp:    Temp: 98.5 F (36.9 C) 97.9 F (36.6 C)  SpO2: 97% 93%    Exam:  Constitutional.  Appears calm, uncomfortable, ill, not toxic. Cardiovascular.  Regular rate and rhythm.  No murmur, rub or gallop. Respiratory.  Clear to auscultation bilaterally.  No wheezes, rales or rhonchi.  Normal respiratory effort. Abdomen.  Soft. Psychiatric.  Grossly normal mood and affect.  Speech fluent and appropriate.  I have personally reviewed the following:   Today's Data  . BMP unremarkable.  Alkaline phosphatase, ALT trending down.  Total bilirubin within normal limits. . WBC continues to trend down of 15.6.  Hemoglobin stable at 10.3.  Scheduled Meds: . fluticasone  2 spray Each Nare Daily  . polyethylene glycol  17 g Oral BID  . senna  1 tablet Oral QHS  . sodium chloride flush  3 mL Intravenous Q12H  . sodium chloride flush  5 mL Intracatheter Q8H   Continuous Infusions: . sodium chloride 250 mL (01/20/19 1544)  . metronidazole 500 mg (01/20/19 1546)  . piperacillin-tazobactam (ZOSYN)  IV Stopped (01/20/19 1032)    Active Problems:   Liver abscess   LOS: 2 days   How to contact the [facility] Attending  or Consulting provider [place] or covering provider during after hours [place], for this patient?  1. Check the care team in [facility] and look for a) attending/consulting TRH provider listed and b) the [facility] team listed 2. Log into  [email or web address] and use [place]'s universal password to access. If you do not have the password, please contact the hospital operator. 3. Locate the [facility] provider you are looking for under [facility] and page to a number that you can be directly reached. 4. If you still have difficulty reaching the provider, please page the [facility] (Director on Call) for the Hospitalists listed on amion for assistance.

## 2019-01-20 NOTE — Progress Notes (Signed)
GI Inpatient Follow-up Note  Subjective:  Patient seen in follow-up for hepatic abscesses. No acute events overnight. She reports her nausea is improved from yesterday. She still endorses night sweats and did have to change her gown two times. Her main complaint today is constipation. She denies any baseline constipation, but reports it has been about 6 days since her last BM. She is passing flatus. She denies nausea, vomiting, abdominal pain, abdominal distention, hematochezia, or melena. MRI/MRCP yesterday was negative. She had echo performed this morning with results not yet available. WBC down to 15.6.   Scheduled Inpatient Medications:  . fluticasone  2 spray Each Nare Daily  . polyethylene glycol  17 g Oral BID  . senna  1 tablet Oral QHS  . sodium chloride flush  3 mL Intravenous Q12H  . sodium chloride flush  5 mL Intracatheter Q8H    Continuous Inpatient Infusions:   . sodium chloride Stopped (01/19/19 2228)  . metronidazole Stopped (01/20/19 [id number])  . piperacillin-tazobactam (ZOSYN)  IV Stopped (01/20/19 1032)    PRN Inpatient Medications:  sodium chloride, acetaminophen **OR** acetaminophen, HYDROcodone-acetaminophen, morphine injection, ondansetron **OR** ondansetron (ZOFRAN) IV, sodium chloride flush  Review of Systems: Constitutional: Weight is stable.  Eyes: No changes in vision. ENT: No oral lesions, sore throat.  GI: see HPI.  Heme/Lymph: No easy bruising.  CV: No chest pain.  GU: No hematuria.  Integumentary: No rashes.  Neuro: No headaches.  Psych: No depression/anxiety.  Endocrine: No heat/cold intolerance.  Allergic/Immunologic: No urticaria.  Resp: No cough, SOB.  Musculoskeletal: No joint swelling.    Physical Examination: BP 119/72 (BP Location: Right Arm)   Pulse 60   Temp 98.5 F (36.9 C) (Oral)   Resp 16   Ht 5\' 7"  (1.702 m)   Wt 98.3 kg   LMP  (LMP Unknown) Comment: rt ovary removed  SpO2 97%   BMI 33.94 kg/m  Gen: NAD, alert and oriented  x 4 HEENT: PEERLA, EOMI, Neck: supple, no JVD or thyromegaly Chest: CTA bilaterally, no wheezes, crackles, or other adventitious sounds CV: RRR, no m/g/c/r Abd: soft, NT, ND, +BS in all four quadrants; no HSM, guarding, ridigity, or rebound tenderness Ext: no edema, well perfused with 2+ pulses, Skin: no rash or lesions noted Lymph: no LAD  Data: Lab Results  Component Value Date   WBC 15.6 (H) 01/20/2019   HGB 10.3 (L) 01/20/2019   HCT 28.7 (L) 01/20/2019   MCV 76.3 (L) 01/20/2019   PLT 555 (H) 01/20/2019   Recent Labs  Lab 01/18/19 1258 01/19/19 0654 01/20/19 0458  HGB 11.4* 10.0* 10.3*   Lab Results  Component Value Date   NA 134 (L) 01/20/2019   K 4.2 01/20/2019   CL 99 01/20/2019   CO2 22 01/20/2019   BUN 14 01/20/2019   CREATININE 0.71 01/20/2019   Lab Results  Component Value Date   ALT 47 (H) 01/20/2019   AST 25 01/20/2019   ALKPHOS 199 (H) 01/20/2019   BILITOT 0.7 01/20/2019   Recent Labs  Lab 01/18/19 1258  INR 1.2   MRI/MRCP 01/19/2019: IMPRESSION: 1. Interval decrease in size of dominant fluid collection along the dome of liver status post percutaneous pigtail drainage catheter placement. 2. Smaller abscess within segment 4b is mildly decreased in the interval. Unchanged to slightly increased in volume of segment 3 abscess. 3. Small hiatal hernia.   Assessment/Plan:  69 y/o AA female admitted for 2-week history of night sweats, fatigue, low-grade fever, and intermittent nausea and  vomiting. She was found to have significant leukocytosis (WBC 27K) with CT imaging showed multiple complex rim enhancing hepatic abscesses (8.7x5.8cm)  1. Complex hepatic abscesses 2. Elevated LFTs - consistent with mixed or cholestatic process 3. Leukocytosis - trending down, 2/2 #1 4. Constipation - will give magnesium citrate this AM, enema this afternoon if not effective   -Aerobic/anaerobic gram stain with abundant WBC present, predominantly PMN, few  gram positive cocci. No culture growth <24h.  -MRCP reviewed with patient and negative for any obvious biliary source to explain hepatic abscesses. No evidence of choledochal cysts, neoplasm, etc. -Continue IV antibiotics  -Echocardiogram pending to rule out cardiac source -Diagnostic EGD in outpatient setting (enteritis, Crohn's, etc) -Appreciate ID recs, follow-up cultures -Following along with you    Please call with questions or concerns.    Octavia Bruckner, PA-C [facility] [phone] (Cell)

## 2019-01-21 LAB — CBC WITH DIFFERENTIAL/PLATELET
Abs Immature Granulocytes: 0.19 K/uL — ABNORMAL HIGH (ref 0.00–0.07)
Basophils Absolute: 0.1 K/uL (ref 0.0–0.1)
Basophils Relative: 1 %
Eosinophils Absolute: 0.1 K/uL (ref 0.0–0.5)
Eosinophils Relative: 1 %
HCT: 31.4 % — ABNORMAL LOW (ref 36.0–46.0)
Hemoglobin: 11.5 g/dL — ABNORMAL LOW (ref 12.0–15.0)
Immature Granulocytes: 1 %
Lymphocytes Relative: 11 %
Lymphs Abs: 1.7 K/uL (ref 0.7–4.0)
MCH: 28 pg (ref 26.0–34.0)
MCHC: 36.6 g/dL — ABNORMAL HIGH (ref 30.0–36.0)
MCV: 76.4 fL — ABNORMAL LOW (ref 80.0–100.0)
Monocytes Absolute: 0.6 K/uL (ref 0.1–1.0)
Monocytes Relative: 4 %
Neutro Abs: 12.1 K/uL — ABNORMAL HIGH (ref 1.7–7.7)
Neutrophils Relative %: 82 %
Platelets: 609 K/uL — ABNORMAL HIGH (ref 150–400)
RBC: 4.11 MIL/uL (ref 3.87–5.11)
RDW: 13 % (ref 11.5–15.5)
WBC: 14.7 K/uL — ABNORMAL HIGH (ref 4.0–10.5)
nRBC: 0 % (ref 0.0–0.2)

## 2019-01-21 LAB — AEROBIC/ANAEROBIC CULTURE W GRAM STAIN (SURGICAL/DEEP WOUND)

## 2019-01-21 MED ORDER — POLYETHYLENE GLYCOL 3350 17 G PO PACK: 17.0000 g | PACK | Freq: Every evening | ORAL | Status: DC | PRN

## 2019-01-21 MED ORDER — SENNA 8.6 MG PO TABS: 1.0000 | ORAL_TABLET | Freq: Every evening | ORAL | Status: DC | PRN

## 2019-01-21 NOTE — Progress Notes (Signed)
PROGRESS NOTE  Allison Phillips [id number] DOB: 04/20/1950 DOA: 01/18/2019 PCP: Delsa Grana, PA-C  Brief History   69 year old woman with really any significant past medical history presented with intermittent nausea and vomiting, chills and sweats for nearly 2 weeks.  Further evaluation revealed multiple hepatic abscesses and she was admitted for further evaluation.  A & P  Hepatic abscesses with SIRS, without sepsis - CT guided perc drain x2 into dominant components of liver abscesses on 01/18/2019 --Etiology unclear, had a colonoscopy January 2020 which was notable only for polyps and a nodule, subsequent pathology was negative for malignancy MRCP no evidence of neoplasm or choledocho cysts. --Echocardiogram unremarkable --Continue empiric antibiotics.  Follow-up culture data.  Follow-up E histolytica serology. --Culture data pending, for now continue Zosyn and Flagyl.  Appreciate infectious disease.  Follow-up E histolytica serology.  Appreciate infectious disease consultation.  Dr. Leana Gamer continues to follow remotely. --Will need antibiotics for least 4 weeks, PICC line once blood cultures are known to be negative --GI consultation appreciated  DVT prophylaxis: SCDs Code Status: Full Family Communication: none Disposition Plan: home    Thornell Mule, MD  [facility] Direct contact: see [email or web address] (further directions at bottom of note if needed) 7PM-7AM contact night coverage as at bottom of note 01/21/2019, 2:20 PM  LOS: 3 days   Significant Hospital Events    12/31 admitted for hepatic abscesses  Consults:   Interventional radiology  Infectious disease  Gastroenterology  Procedures:   12/31 CT-guided percutaneous hepatic drain placements  Significant Diagnostic Tests:   Covid, influenza negative  12/31 CT abdomen pelvis multiple liver abscesses, bowel thickening likely secondary to chronic diverticular disease  12/31 chest x-ray no acute  disease  1/1 MRCP interval decrease in dominant fluid collection, smaller abscess decreased in the interval.  Unchanged slightly increased in volume segment 3 abscess.  1/two 2D echocardiogram.  LVEF 60 to 65%.  Normal left ventricular systolic function.  No regional wall motion abnormalities.  RV systolic function normal.  No vegetations noted.  Micro Data:   12/31 blood cultures  Antimicrobials:   Zosyn 12/31  Flagyl 12/31  Interval History/Subjective  Patient is status post drainage placement.  Allison Phillips is feeling much better was sitting on chair.  Pain is minimal.  Denies any fever.  Objective   Vitals:  Vitals:   01/21/19 0004 01/21/19 0750  BP: 114/65 117/70  Pulse: 70 (!) 58  Resp: 18 16  Temp: 99.2 F (37.3 C) 98.6 F (37 C)  SpO2: 97% 98%    Exam:  Constitutional.  Appears calm, uncomfortable, ill, not toxic. Cardiovascular.  Regular rate and rhythm.  No murmur, rub or gallop. Respiratory.  Clear to auscultation bilaterally.  No wheezes, rales or rhonchi.  Normal respiratory effort. Abdomen.  Soft.  Percutaneous tube insertion site trend pain, dressings on.  Drainage bag has minimal drainage after changing this morning Psychiatric.  Grossly normal mood and affect.  Speech fluent and appropriate.  I have personally reviewed the following:   Today's Data  . BMP unremarkable.  Alkaline phosphatase, ALT trending down.  Total bilirubin within normal limits. . WBC continues to trend down of 15.6.  Hemoglobin stable at 10.3.  Scheduled Meds: . fluticasone  2 spray Each Nare Daily  . sodium chloride flush  10-40 mL Intracatheter Q12H  . sodium chloride flush  5 mL Intracatheter Q8H   Continuous Infusions: . sodium chloride 250 mL (01/21/19 1103)  . metronidazole 500 mg (01/21/19 0620)  . piperacillin-tazobactam (ZOSYN)  IV 3.375 g (01/21/19 1104)    Active Problems:   Liver abscess   LOS: 3 days   How to contact the [facility] Attending or Consulting provider  [place] or covering provider during after hours [place], for this patient?  1. Check the care team in [facility] and look for a) attending/consulting TRH provider listed and b) the [facility] team listed 2. Log into [email or web address] and use [place]'s universal password to access. If you do not have the password, please contact the hospital operator. 3. Locate the [facility] provider you are looking for under [facility] and page to a number that you can be directly reached. 4. If you still have difficulty reaching the provider, please page the [facility] (Director on Call) for the Hospitalists listed on amion for assistance.

## 2019-01-21 NOTE — Progress Notes (Signed)
GI Inpatient Follow-up Note  Subjective:  Patient seen in follow-up for hepatic abscesses, etiology unclear at this time. No acute events overnight. Echo performed yesterday was unremarkable for cardiac source. She reports she is feeling better this morning. Last night was the first night she did not have to change clothes due to night sweats. She denies nausea, vomiting, abdominal pain, fevers, or chills. No blood was drawn this morning. She reports the magnesium citrate worked well to produce BMs. She reports she has had two loose BMs this morning which were non-bloody.   Scheduled Inpatient Medications:  . fluticasone  2 spray Each Nare Daily  . polyethylene glycol  17 g Oral BID  . senna  1 tablet Oral QHS  . sodium chloride flush  10-40 mL Intracatheter Q12H  . sodium chloride flush  3 mL Intravenous Q12H  . sodium chloride flush  5 mL Intracatheter Q8H    Continuous Inpatient Infusions:   . sodium chloride Stopped (01/20/19 1714)  . metronidazole 500 mg (01/21/19 0620)  . piperacillin-tazobactam (ZOSYN)  IV 3.375 g (01/21/19 0359)    PRN Inpatient Medications:  sodium chloride, acetaminophen **OR** acetaminophen, HYDROcodone-acetaminophen, morphine injection, ondansetron **OR** ondansetron (ZOFRAN) IV, sodium chloride flush, sodium chloride flush  Review of Systems: Constitutional: Weight is stable.  Eyes: No changes in vision. ENT: No oral lesions, sore throat.  GI: see HPI.  Heme/Lymph: No easy bruising.  CV: No chest pain.  GU: No hematuria.  Integumentary: No rashes.  Neuro: No headaches.  Psych: No depression/anxiety.  Endocrine: No heat/cold intolerance.  Allergic/Immunologic: No urticaria.  Resp: No cough, SOB.  Musculoskeletal: No joint swelling.    Physical Examination: BP 117/70 (BP Location: Right Arm)   Pulse (!) 58   Temp 98.6 F (37 C) (Oral)   Resp 16   Ht 5\' 7"  (1.702 m)   Wt 98.3 kg   LMP  (LMP Unknown) Comment: rt ovary removed  SpO2 98%    BMI 33.94 kg/m  Gen: NAD, alert and oriented x 4 HEENT: PEERLA, EOMI, Neck: supple, no JVD or thyromegaly Chest: CTA bilaterally, no wheezes, crackles, or other adventitious sounds CV: RRR, no m/g/c/r Abd: soft, NT, ND, +BS in all four quadrants; no HSM, guarding, ridigity, or rebound tenderness Ext: no edema, well perfused with 2+ pulses, Skin: no rash or lesions noted Lymph: no LAD  Data: Lab Results  Component Value Date   WBC 15.6 (H) 01/20/2019   HGB 10.3 (L) 01/20/2019   HCT 28.7 (L) 01/20/2019   MCV 76.3 (L) 01/20/2019   PLT 555 (H) 01/20/2019   Recent Labs  Lab 01/18/19 1258 01/19/19 0654 01/20/19 0458  HGB 11.4* 10.0* 10.3*   Lab Results  Component Value Date   NA 134 (L) 01/20/2019   K 4.2 01/20/2019   CL 99 01/20/2019   CO2 22 01/20/2019   BUN 14 01/20/2019   CREATININE 0.71 01/20/2019   Lab Results  Component Value Date   ALT 47 (H) 01/20/2019   AST 25 01/20/2019   ALKPHOS 199 (H) 01/20/2019   BILITOT 0.7 01/20/2019   Recent Labs  Lab 01/18/19 1258  INR 1.2   Assessment/Plan:  69 y/o AA female admitted for 2-week history of night sweats, fatigue, low-grade fever, and intermittent nausea and vomiting. She was found to have significant leukocytosis (WBC 27K) with CT imaging showed multiple complex rim enhancing hepatic abscesses (8.7x5.8cm)  1. Complex hepatic abscesses 2. Elevated LFTs - consistent with mixed or cholestatic process 3. Leukocytosis -  trending down, 2/2 #1. Not checked today.  4. Constipation - improved. Bowel regimen prn.   Recommendations:  1. Aerobic/anaerobic gram stain with abundant WBC present, predominantly PMN, few gram positive cocci. No culture growth 3 days.  2. Echocardiogram reviewed and negative for cardiac source 3. Appreciate ID recs, follow-up final results of cultures 4. She will need PICC placement and long-term antibiotics for 4-6 weeks 5. Diagnostic EGD in outpatient setting  Please call with questions  or concerns.    Octavia Bruckner, PA-C [facility] [phone] (Cell)

## 2019-01-22 ENCOUNTER — Telehealth: Payer: Self-pay

## 2019-01-22 ENCOUNTER — Inpatient Hospital Stay: Payer: Self-pay

## 2019-01-22 DIAGNOSIS — Z95828 Presence of other vascular implants and grafts: Secondary | ICD-10-CM

## 2019-01-22 LAB — CBC WITH DIFFERENTIAL/PLATELET
Abs Immature Granulocytes: 0.15 10*3/uL — ABNORMAL HIGH (ref 0.00–0.07)
Basophils Absolute: 0.1 10*3/uL (ref 0.0–0.1)
Basophils Relative: 1 %
Eosinophils Absolute: 0.1 10*3/uL (ref 0.0–0.5)
Eosinophils Relative: 1 %
HCT: 30.6 % — ABNORMAL LOW (ref 36.0–46.0)
Hemoglobin: 11.3 g/dL — ABNORMAL LOW (ref 12.0–15.0)
Immature Granulocytes: 1 %
Lymphocytes Relative: 14 %
Lymphs Abs: 2 10*3/uL (ref 0.7–4.0)
MCH: 27.9 pg (ref 26.0–34.0)
MCHC: 36.9 g/dL — ABNORMAL HIGH (ref 30.0–36.0)
MCV: 75.6 fL — ABNORMAL LOW (ref 80.0–100.0)
Monocytes Absolute: 0.8 10*3/uL (ref 0.1–1.0)
Monocytes Relative: 5 %
Neutro Abs: 11.6 10*3/uL — ABNORMAL HIGH (ref 1.7–7.7)
Neutrophils Relative %: 78 %
Platelets: 638 10*3/uL — ABNORMAL HIGH (ref 150–400)
RBC: 4.05 MIL/uL (ref 3.87–5.11)
RDW: 12.8 % (ref 11.5–15.5)
WBC: 14.7 10*3/uL — ABNORMAL HIGH (ref 4.0–10.5)
nRBC: 0 % (ref 0.0–0.2)

## 2019-01-22 LAB — AEROBIC/ANAEROBIC CULTURE W GRAM STAIN (SURGICAL/DEEP WOUND)

## 2019-01-22 MED ORDER — SODIUM CHLORIDE 0.9 % IV SOLN
3.0000 g | Freq: Four times a day (QID) | INTRAVENOUS | Status: DC
  Administered 2019-01-22 – 2019-01-23 (×3): 3 g via INTRAVENOUS
  Filled 2019-01-22: qty 8
  Filled 2019-01-22 (×2): qty 3
  Filled 2019-01-22 (×2): qty 8
  Filled 2019-01-22: qty 3
  Filled 2019-01-22 (×3): qty 8

## 2019-01-22 MED ORDER — RISAQUAD PO CAPS: 1.0000 | ORAL_CAPSULE | Freq: Every day | ORAL | Status: DC

## 2019-01-22 NOTE — Telephone Encounter (Signed)
Copied from Anna [phone]. Topic: General - Other >> Jan 22, 2019 10:44 AM Antonieta Iba C wrote: Reason for CRM: pt called in to request a call back. Pt says that she has been in the hospital and have some questions for her provider.  [phone]

## 2019-01-22 NOTE — Treatment Plan (Signed)
OPAT  Diagnosis: Liver abscess  Baseline Creatinine < 1 Culture Result: Fusobacterium nucleatum  No Known Allergies  OPAT Orders Discharge antibiotics: Unasyn 3 grams Iv every 6 hours Until 02/15/19   [facility] Care Per Protocol:including placement of biopatch  Labs weekly while on IV antibiotics: _X_ CBC with differential  _X_ CMP   X Please leave PIC in place until doctor has seen patient or been notified  Fax weekly labs to [facility]  [phone]  Clinic Follow Up Appt:2 weeks - Call [phone] to make appt

## 2019-01-22 NOTE — Progress Notes (Signed)
PHARMACY CONSULT NOTE FOR:  OUTPATIENT  PARENTERAL ANTIBIOTIC THERAPY (OPAT)  Indication: Liver abscesses Regimen: Unasyn 3 gm IV q6h End date: 02/15/2019  IV antibiotic discharge orders are pended. To discharging provider:  please sign these orders via discharge navigator,  Select New Orders & click on the button choice - Manage This Unsigned Work.     Thank you for allowing pharmacy to be a part of this patient's care.  Allison Phillips A 01/22/2019, 2:22 PM

## 2019-01-22 NOTE — Care Management Important Message (Signed)
Important Message  Patient Details  Name: Allison Phillips MRN: [id number] Date of Birth: 1950/11/04   Medicare Important Message Given:  Yes     Juliann Pulse A Francis Yardley 01/22/2019, 11:41 AM

## 2019-01-22 NOTE — Progress Notes (Signed)
PROGRESS NOTE  Allison Phillips [id number] DOB: 02/16/1950 DOA: 01/18/2019 PCP: Delsa Grana, PA-C  Brief History   69 year old woman with really any significant past medical history presented with intermittent nausea and vomiting, chills and sweats for nearly 2 weeks.  Further evaluation revealed multiple hepatic abscesses and she was admitted for further evaluation.  A & P  Hepatic abscesses with SIRS, without sepsis - CT guided perc drain x2 into dominant components of liver abscesses on 01/18/2019 --Etiology unclear, had a colonoscopy January 2020 which was notable only for polyps and a nodule, subsequent pathology was negative for malignancy MRCP no evidence of neoplasm or choledocho cysts. --Echocardiogram unremarkable --Continue empiric antibiotics.  Follow-up culture data.    --Culture data pending, for now continue Zosyn and Flagyl.  Appreciate infectious disease.   Follow-up E histolytica serology --> Pending   Appreciate infectious disease consultation.  Dr. Leana Gamer continues to follow remotely. Will follow further recs  --Will need antibiotics for least 4 weeks, PICC line once blood cultures are known to be negative;  --GI consultation appreciated  DVT prophylaxis: SCDs Code Status: Full Family Communication: none Disposition Plan: home    Thornell Mule, MD  [facility] Direct contact: see [email or web address] (further directions at bottom of note if needed) 7PM-7AM contact night coverage as at bottom of note 01/22/2019, 5:20 PM  LOS: 4 days   Significant Hospital Events    12/31 admitted for hepatic abscesses  Consults:   Interventional radiology  Infectious disease  Gastroenterology  Procedures:   12/31 CT-guided percutaneous hepatic drain placements  Significant Diagnostic Tests:   Covid, influenza negative  12/31 CT abdomen pelvis multiple liver abscesses, bowel thickening likely secondary to chronic diverticular disease  12/31 chest x-ray no  acute disease  1/1 MRCP interval decrease in dominant fluid collection, smaller abscess decreased in the interval.  Unchanged slightly increased in volume segment 3 abscess.  1/two 2D echocardiogram.  LVEF 60 to 65%.  Normal left ventricular systolic function.  No regional wall motion abnormalities.  RV systolic function normal.  No vegetations noted.  Micro Data:   12/31 blood cultures  Antimicrobials:   Zosyn 12/31  Flagyl 12/31  Interval History/Subjective  Patient is status post drainage placement.  Wondering when she may go home.   Objective   Vitals:  Vitals:   01/22/19 0741 01/22/19 1520  BP: 126/71 118/80  Pulse: (!) 57 70  Resp:    Temp: 98.2 F (36.8 C) 98.9 F (37.2 C)  SpO2: 95% 97%    Exam:  Constitutional.  Appears calm, uncomfortable, ill, not toxic. Cardiovascular.  Regular rate and rhythm.  No murmur, rub or gallop. Respiratory.  Clear to auscultation bilaterally.  No wheezes, rales or rhonchi.  Normal respiratory effort. Abdomen.  Soft.  Percutaneous tube insertion site trend pain, dressings on.  Drainage bag has minimal drainage after changing this morning Psychiatric.  Grossly normal mood and affect.  Speech fluent and appropriate.  I have personally reviewed the following:   Today's Data  . BMP unremarkable.  Alkaline phosphatase, ALT trending down.  Total bilirubin within normal limits. . WBC continues to trend down of 15.6.  Hemoglobin stable at 10.3.  Scheduled Meds: . fluticasone  2 spray Each Nare Daily  . sodium chloride flush  10-40 mL Intracatheter Q12H  . sodium chloride flush  5 mL Intracatheter Q8H   Continuous Infusions: . sodium chloride 10 mL/hr at 01/22/19 0307  . ampicillin-sulbactam (UNASYN) IV      Active Problems:  Hepatic abscess   LOS: 4 days   How to contact the [employer] Attending or Consulting provider [place] or covering provider during after hours [place], for this patient?  1. Check the care team in [facility] and  look for a) attending/consulting TRH provider listed and b) the [facility] team listed 2. Log into [email or web address] and use [place]'s universal password to access. If you do not have the password, please contact the hospital operator. 3. Locate the [facility] provider you are looking for under [facility] and page to a number that you can be directly reached. 4. If you still have difficulty reaching the provider, please page the [facility] (Director on Call) for the Hospitalists listed on amion for assistance.

## 2019-01-22 NOTE — Telephone Encounter (Signed)
Can you call patient and re assure

## 2019-01-22 NOTE — TOC Progression Note (Signed)
Transition of Care [facility]) - Progression Note    Patient Details  Name: Allison Phillips MRN: [id number] Date of Birth: 01-25-1950  Transition [facility]) CM/SW Contact  Su Hilt, RN Phone Number: 01/22/2019, 2:46 PM  Clinical Narrative:     Dani Gobble with [facility] and requested them to accept the referral for the patient for [facility] services, left a secure VM and requested a call back       Expected Discharge Plan and Services                                                 Social Determinants of Health (SDOH) Interventions    Readmission Risk Interventions No flowsheet data found.

## 2019-01-22 NOTE — Progress Notes (Signed)
Pharmacy Antibiotic Note  Allison Phillips is a 69 y.o. female admitted on 01/18/2019 with Liver Abscess.  Pharmacy has been consulted for Zosyn dosing.  Plan: Zosyn 3.375g IV q8h (4 hour infusion).   Per ID plan abx x 4 weeks   Height: 5\' 7"  (170.2 cm) Weight: 216 lb 11.4 oz (98.3 kg) IBW/kg (Calculated) : 61.6  Temp (24hrs), Avg:98.8 F (37.1 C), Min:98.2 F (36.8 C), Max:99.5 F (37.5 C)  Recent Labs  Lab 01/17/19 0000 01/18/19 1054 01/18/19 1258 01/18/19 1501 01/19/19 0654 01/20/19 0458 01/21/19 0939 01/22/19 0408  WBC 27.6* 23.8* 23.9*  --  19.2* 15.6* 14.7* 14.7*  CREATININE 0.75 0.81  --   --  0.77 0.71  --   --   LATICACIDVEN  --   --   --  0.6  --   --   --   --     Estimated Creatinine Clearance: 82.2 mL/min (by C-G formula based on SCr of 0.71 mg/dL).    No Known Allergies  Antimicrobials this admission: Zosyn 12/31 >>  Metronidazole 12/31 >>    Microbiology results:  Thank you for allowing pharmacy to be a part of this patient's care.  Chinita Greenland PharmD Clinical Pharmacist 01/22/2019

## 2019-01-22 NOTE — Progress Notes (Signed)
ID Pt feeling better appetite better No fever Some soreness abdomen and pain back Constipation resolved  Patient Vitals for the past 24 hrs:  BP Temp Temp src Pulse Resp SpO2  01/22/19 0741 126/71 98.2 F (36.8 C) Oral (!) 57 -- 95 %  01/22/19 0014 110/63 98.6 F (37 C) Oral 67 16 97 %  01/21/19 1508 111/67 99.5 F (37.5 C) Oral 69 15 96 %  alert, no distress Chest b/l air entry- decreased bases [id number] Abd- rt upper quadrant drains  CBC Latest Ref Rng & Units 01/22/2019 01/21/2019 01/20/2019  WBC 4.0 - 10.5 K/uL 14.7(H) 14.7(H) 15.6(H)  Hemoglobin 12.0 - 15.0 g/dL 11.3(L) 11.5(L) 10.3(L)  Hematocrit 36.0 - 46.0 % 30.6(L) 31.4(L) 28.7(L)  Platelets 150 - 400 K/uL 638(H) 609(H) 555(H)    CMP Latest Ref Rng & Units 01/20/2019 01/19/2019 01/18/2019  Glucose 70 - 99 mg/dL 92 111(H) 112(H)  BUN 8 - 23 mg/dL 14 12 19   Creatinine 0.44 - 1.00 mg/dL 0.71 0.77 0.81  Sodium 135 - 145 mmol/L 134(L) 135 130(L)  Potassium 3.5 - 5.1 mmol/L 4.2 4.5 4.6  Chloride 98 - 111 mmol/L 99 102 95(L)  CO2 22 - 32 mmol/L 22 23 22   Calcium 8.9 - 10.3 mg/dL 8.5(L) 8.5(L) 9.2  Total Protein 6.5 - 8.1 g/dL 6.8 6.6 8.3(H)  Total Bilirubin 0.3 - 1.2 mg/dL 0.7 0.7 0.9  Alkaline Phos 38 - 126 U/L 199(H) 207(H) 277(H)  AST 15 - 41 U/L 25 41 48(H)  ALT 0 - 44 U/L 47(H) 58(H) 73(H)    Micro Liver abscess - culture- fusobacterium nucleatum  There is bowel wall thickening throughout the sigmoid colon which is likely secondary to chronic diverticular disease given no associated inflammatory stranding.   Liver abscesses- multiple- anerobic bacteria fusobacterium cultured- Intestinal organism. Unclear source pathology. Could be from the chronic diverticular disease. ( no acute inflammation) Seen by GI- MRI/MRCP no gall bladder pathology  Zosyn and flagyl will be switched to unasyn Will need for 4 weeks - As theere is ongoing drainage the drains cannot be removed now. Will need imaging as OP and if the abscesses  have resolved then it can be removed will have to follow up as Op with GI and will follow up with me as Op Pt currently has a midline but this will not last for the entire course and hence will have to be removed and a PICC placed  Discussed the management with patient and her husband and with Dr.Alam

## 2019-01-23 LAB — CULTURE, BLOOD (ROUTINE X 2)
Culture: NO GROWTH
Culture: NO GROWTH
Special Requests: ADEQUATE

## 2019-01-23 LAB — CBC WITH DIFFERENTIAL/PLATELET
Abs Immature Granulocytes: 0.12 10*3/uL — ABNORMAL HIGH (ref 0.00–0.07)
Basophils Absolute: 0.1 10*3/uL (ref 0.0–0.1)
Basophils Relative: 0 %
Eosinophils Absolute: 0.1 10*3/uL (ref 0.0–0.5)
Eosinophils Relative: 1 %
HCT: 31.6 % — ABNORMAL LOW (ref 36.0–46.0)
Hemoglobin: 10.9 g/dL — ABNORMAL LOW (ref 12.0–15.0)
Immature Granulocytes: 1 %
Lymphocytes Relative: 17 %
Lymphs Abs: 2.2 10*3/uL (ref 0.7–4.0)
MCH: 27.9 pg (ref 26.0–34.0)
MCHC: 34.5 g/dL (ref 30.0–36.0)
MCV: 80.8 fL (ref 80.0–100.0)
Monocytes Absolute: 0.8 10*3/uL (ref 0.1–1.0)
Monocytes Relative: 7 %
Neutro Abs: 9.2 10*3/uL — ABNORMAL HIGH (ref 1.7–7.7)
Neutrophils Relative %: 74 %
Platelets: 620 10*3/uL — ABNORMAL HIGH (ref 150–400)
RBC: 3.91 MIL/uL (ref 3.87–5.11)
RDW: 12.9 % (ref 11.5–15.5)
WBC: 12.5 10*3/uL — ABNORMAL HIGH (ref 4.0–10.5)
nRBC: 0 % (ref 0.0–0.2)

## 2019-01-23 MED ORDER — POLYETHYLENE GLYCOL 3350 17 G PO PACK: 17.0000 g | PACK | Freq: Every evening | ORAL | 0 refills | Status: DC | PRN

## 2019-01-23 MED ORDER — SODIUM CHLORIDE 0.9% FLUSH
10.0000 mL | Freq: Two times a day (BID) | INTRAVENOUS | Status: DC
  Administered 2019-01-23: 10 mL

## 2019-01-23 MED ORDER — CHLORHEXIDINE GLUCONATE CLOTH 2 % EX PADS
6.0000 | MEDICATED_PAD | Freq: Every day | CUTANEOUS | Status: DC
  Administered 2019-01-23: 6 via TOPICAL

## 2019-01-23 MED ORDER — SODIUM CHLORIDE 0.9% FLUSH: 10.0000 mL | INTRAVENOUS | Status: DC | PRN

## 2019-01-23 MED ORDER — HYDROCODONE-ACETAMINOPHEN 5-325 MG PO TABS: 1.0000 | ORAL_TABLET | Freq: Four times a day (QID) | ORAL | 0 refills | Status: DC | PRN

## 2019-01-23 MED ORDER — SODIUM CHLORIDE 0.9 % IV SOLN: 3.0000 g | Freq: Four times a day (QID) | INTRAVENOUS | 0 refills | Status: DC

## 2019-01-23 MED ORDER — ONDANSETRON 4 MG PO TBDP: 4.0000 mg | ORAL_TABLET | Freq: Three times a day (TID) | ORAL | 0 refills | Status: DC | PRN

## 2019-01-23 MED ORDER — SENNA 8.6 MG PO TABS: 1.0000 | ORAL_TABLET | Freq: Every evening | ORAL | 0 refills | Status: DC | PRN

## 2019-01-23 NOTE — TOC Transition Note (Signed)
Transition of Care [facility]) - CM/SW Discharge Note   Patient Details  Name: INGE WALDROUP MRN: [id number] Date of Birth: 1950/04/05  Transition of Care [facility]) CM/SW Contact:  Su Hilt, RN Phone Number: 01/23/2019, 10:52 AM   Clinical Narrative:    Met with the patient to Discuss DC needs and plan, She will DC home today with her husband, Her Husband and her Corbin will be coming to the Hospital to have IV ABX Infusion Teaching provided by Pam with [facility]. Between 12-1 today, The patient has a RW and a BSC at home, I let her know that insurance does not covered a toilet riser and I offered her a 3 in 1 however she declined.  I provided the patient with the Outpatient referral for to do Outpatient PT, she stated that she will choose a rehab outpatient facility and make an appointment when ready.         Patient Goals and CMS Choice        Discharge Placement                       Discharge Plan and Services                                     Social Determinants of Health (SDOH) Interventions     Readmission Risk Interventions No flowsheet data found.

## 2019-01-23 NOTE — Progress Notes (Signed)
Peripherally Inserted Central Catheter/Midline Placement  The IV Nurse has discussed with the patient and/or persons authorized to consent for the patient, the purpose of this procedure and the potential benefits and risks involved with this procedure.  The benefits include less needle sticks, lab draws from the catheter, and the patient may be discharged home with the catheter. Risks include, but not limited to, infection, bleeding, blood clot (thrombus formation), and puncture of an artery; nerve damage and irregular heartbeat and possibility to perform a PICC exchange if needed/ordered by physician.  Alternatives to this procedure were also discussed.  Bard Power PICC patient education guide, fact sheet on infection prevention and patient information card has been provided to patient /or left at bedside.    PICC/Midline Placement Documentation  PICC Single Lumen 01/23/19 PICC Right Brachial 40 cm 1 cm (Active)  Indication for Insertion or Continuance of Line Home intravenous therapies (PICC only) 01/23/19 0920  Exposed Catheter (cm) 1 cm 01/23/19 0920  Site Assessment Clean;Dry;Intact 01/23/19 0920  Line Status Flushed;Saline locked;Blood return noted 01/23/19 0920  Dressing Type Transparent;Securing device 01/23/19 0920  Dressing Status Clean;Dry;Intact;Antimicrobial disc in place 01/23/19 0920  Dressing Intervention New dressing 01/23/19 0920  Dressing Change Due 01/30/19 01/23/19 0920       Enos Fling 01/23/2019, 9:37 AM

## 2019-01-23 NOTE — Discharge Summary (Signed)
Physician Discharge Summary  Patient ID: Allison Phillips MRN: [id number] DOB/AGE: 68-25-1952 68 y.o.  Admit date: 01/18/2019 Discharge date: 01/23/2019  Admission Diagnoses:  Discharge Diagnoses:  Active Problems:   Hepatic abscess   Discharged Condition: stable  Hospital Course:  Brief History   69 year old woman with really any significant past medical history presented with intermittent nausea and vomiting, chills and sweats for nearly 2 weeks. Further evaluation revealed multiple hepatic abscesses and she was admitted for further evaluation.  Hepatic abscesses with SIRS, without sepsis - CT guided perc drain x2 into dominant components of liver abscesses on 01/18/2019 -Still draining daily --Etiology unclear, had a colonoscopy January 2020 which was notable only for polyps and a nodule, subsequent pathology was negative for malignancy MRCP no evidence of neoplasm or choledocho cysts. --Echocardiogram unremarkable --Continue empiric antibiotics.    Patient is to receive Unasyn until February 15, 2019 per infectious disease.  Home health care nurse will draw blood per ID recommendation and ID will follow up.  --Culture data pending from liver source  - Follow-up E histolytica serology --> Pending  --  PICC line stent on 01/22/2019 once blood cultures were negative --GI consultation  -Unit clerk to schedule an appointment with Dr. Madolyn Frieze, a GI specialist in 1 week for patient to follow-up care on drainage -Also home health care nursing will provide drainage care and clean patient and family  Consults: ID and GI IR  Significant Diagnostic Studies: angiography: Radiology, IR and blood work  Treatments: As per hospital course and discharge med list  Discharge Exam: Blood pressure 108/68, pulse 66, temperature 98.4 F (36.9 C), temperature source Oral, resp. rate 16, height 5\' 7"  (1.702 m), weight 98.3 kg, SpO2 97 %.  Constitutional.  Appears calm, uncomfortable, ill, not  toxic. Cardiovascular.  Regular rate and rhythm.  No murmur, rub or gallop. Respiratory.  Clear to auscultation bilaterally.  No wheezes, rales or rhonchi.  Normal respiratory effort. Abdomen.  Soft.  Percutaneous tube insertion site dressings on.  Drainage bag has minimal drainage after changing this morning.  Mild tender.  Insertion site dry and clean Psychiatric.  Grossly normal mood and affect.  Speech fluent and appropriate.  Disposition: Discharge disposition: 06-Home-Health Care Svc     Patient is a stable enough to be discharged home with home health care nursing.  Per case manager patient's insurance was not approved for home health PT and OT.  Ordered for outpatient PT OT.  Warning signs and symptoms explained to patient when she must seek immediate medical attention.  Patient expressed understanding. -Patient is to follow-up with GI clinic, Dr. Madolyn Frieze as per her wish in 1 week.  Unit clerk to provide appointment date and time.  Also advised to follow-up with PCP in 2 weeks.  Infectious disease, Dr. Levester Fresh will follow patient's blood work and make adjustment if needed.  Discharge Instructions    Call MD for:  redness, tenderness, or signs of infection (pain, swelling, redness, odor or green/yellow discharge around incision site)   Complete by: As directed    Call MD for:  temperature >100.4   Complete by: As directed    Diet - low sodium heart healthy   Complete by: As directed    Increase activity slowly   Complete by: As directed      Allergies as of 01/23/2019   No Known Allergies     Medication List    TAKE these medications   Ampicillin-Sulbactam 3 g in sodium  chloride 0.9 % 100 mL Inject 3 g into the vein every 6 (six) hours. q6hrs until 02/15/19   aspirin EC 81 MG tablet Take 81 mg by mouth daily.   B-12 500 MCG Tabs Take 2,500 mcg by mouth daily.   FISH OIL PO Take 1,200 mg daily by mouth.   fluticasone 50 MCG/ACT nasal spray Commonly known as:  FLONASE Place 2 sprays into both nostrils daily.   HYDROcodone-acetaminophen 5-325 MG tablet Commonly known as: NORCO/VICODIN Take 1-2 tablets by mouth every 6 (six) hours as needed for moderate pain or severe pain.   multivitamin tablet Take 1 tablet by mouth daily.   ondansetron 4 MG disintegrating tablet Commonly known as: Zofran ODT Take 1 tablet (4 mg total) by mouth every 8 (eight) hours as needed for nausea or vomiting.   polyethylene glycol 17 g packet Commonly known as: MIRALAX / GLYCOLAX Take 17 g by mouth at bedtime as needed for moderate constipation.   senna 8.6 MG Tabs tablet Commonly known as: SENOKOT Take 1 tablet (8.6 mg total) by mouth at bedtime as needed for mild constipation.   Vitamin D3 20 MCG (800 UNIT) Tabs Take 25 mcg by mouth daily.            Durable Medical Equipment  (From admission, onward)         Start     Ordered   01/23/19 1215  For home use only DME Walker rolling  Once    Question:  Patient needs a walker to treat with the following condition  Answer:  Weakness   01/23/19 1214         Follow-up Information    Efrain Sella, MD On 01/31/2019.   Specialty: Gastroenterology Why:  f/u and drainage care @2pm   Contact information: [place] [address] [phone]        Delsa Grana, PA-C On 02/01/2019.   Specialty: Family Medicine Why: Hosp. follow up @11 :20am  Contact information: [address] [place] [address] [phone]           Signed: Thornell Mule 01/23/2019, 1:59 PM

## 2019-01-24 ENCOUNTER — Telehealth: Payer: Self-pay

## 2019-01-24 NOTE — Telephone Encounter (Signed)
Transition Care Management Follow-up Telephone Call  Date of discharge and from where: 01/23/19 [facility]  How have you been since you were released from the hospital? Pt states she is doing okay, c/o pain from drain tubes to be 4/10  Any questions or concerns? Yes  pt concerned about PICC line care and drain tubes. Home health RN from from [facility] to come once weekly and will be drawing labs next week.    Items Reviewed:  Did the pt receive and understand the discharge instructions provided? Yes   Medications obtained and verified? Yes   Any new allergies since your discharge? No   Dietary orders reviewed? Yes  Do you have support at home? Yes  - husband and sister  Functional Questionnaire: (I = Independent and D = Dependent) ADLs: I  Bathing/Dressing- I  Meal Prep- D  Eating- I  Maintaining continence- I  Transferring/Ambulation- I  Managing Meds- I  Follow up appointments reviewed:   PCP Hospital f/u appt confirmed? Yes  Scheduled to see Delsa Grana PAC on 02/01/19 @ 11:20.  [facility] f/u appt confirmed? Yes  Scheduled to see GI on 01/31/19 @ 2:00.  Are transportation arrangements needed? No   If their condition worsens, is the pt aware to call PCP or go to the Emergency Dept.? Yes  Was the patient provided with contact information for the PCP's office or ED? Yes  Was to pt encouraged to call back with questions or concerns? Yes

## 2019-01-24 NOTE — Telephone Encounter (Signed)
Thanks [place] Miners for contacting her in doing the transition of care f/up Thanks for the note!

## 2019-01-25 ENCOUNTER — Ambulatory Visit: Payer: Medicare Other

## 2019-01-25 ENCOUNTER — Telehealth: Payer: Self-pay

## 2019-01-25 ENCOUNTER — Other Ambulatory Visit: Payer: Medicare Other

## 2019-01-25 LAB — MISC LABCORP TEST (SEND OUT): Labcorp test code: 6874

## 2019-01-25 NOTE — Telephone Encounter (Signed)
Scheduled appt for 02/08/2019. Patient following GI on 13th and states IV Abx are working well with no issues. She has help from family members.

## 2019-01-29 ENCOUNTER — Other Ambulatory Visit: Payer: Self-pay | Admitting: Family Medicine

## 2019-02-01 ENCOUNTER — Telehealth: Payer: Self-pay

## 2019-02-01 ENCOUNTER — Encounter: Payer: Self-pay | Admitting: Family Medicine

## 2019-02-01 ENCOUNTER — Ambulatory Visit (INDEPENDENT_AMBULATORY_CARE_PROVIDER_SITE_OTHER): Payer: Medicare Other | Admitting: Family Medicine

## 2019-02-01 ENCOUNTER — Other Ambulatory Visit: Payer: Self-pay

## 2019-02-01 VITALS — BP 118/70 | HR 70 | Temp 96.8°F | Resp 14 | Ht 64.0 in | Wt 196.5 lb

## 2019-02-01 DIAGNOSIS — Z09 Encounter for follow-up examination after completed treatment for conditions other than malignant neoplasm: Secondary | ICD-10-CM | POA: Diagnosis not present

## 2019-02-01 DIAGNOSIS — K750 Abscess of liver: Secondary | ICD-10-CM

## 2019-02-01 NOTE — Telephone Encounter (Signed)
I called [facility] @ 1127a and they will be faxing lab results to [phone].

## 2019-02-01 NOTE — Telephone Encounter (Signed)
This was sent to our office by accident.  Copied from [place] [id number]. Topic: General - Inquiry >> Jan 31, 2019  3:38 PM Virl Axe D wrote: Reason for CRM: Pt stated she had labs done on 01/29/19 with [facility]. Pt stated they will not send her lab results to [facility] unless she or someone from the practice calls to request them. Pt would like for Leisa to request her results. Please advise. [phone]

## 2019-02-01 NOTE — Progress Notes (Signed)
Name: Allison Phillips   MRN: [id number]    DOB: 10/28/1950   Date:02/01/2019       Progress Note  Chief Complaint  Patient presents with  . Hospitalization Follow-up     Subjective:   Allison Phillips is a 69 y.o. female, presents to clinic for routine follow up on the conditions listed above.  Here for hospital follow up/transition of care.  Admit date: 01/18/2019 Discharge date: 01/23/2019 Transition of care was initiated previously by Clemetine Marker on 01/24/2019 and med changes, diagnosis, specialist follow ups and pts symptoms and condition were all reviewed.  Pt was admitted for hepatic abscesses with SIRS, without sepsis New medications started per hospitalization include PICC line, IV abx 4x a day, pain meds, nausea meds, stool softeners She has [place] nursing care F/up with GI and with ID arranged Labs due today - none  Pt feels much better, appetite better, no N, V, no fever sweats chills Main sx is feeling really tired and fatigued still, working on her strength, overall she appears much better Questions about multivitamin women's over 50 daily multi  Has perc drain x 2  Has PICC line for home abx 4 x a day ampicillin-sulbactam, doing at home with help of her sister [facility] is also sending her supplies and doing labs every Monday- they are ordered by Dr. Margot Ables   Did f/up with GI [facility] - [facility],  Going back for endoscopy  Ravishankar f/up 1/21 -   Site of her drains and PICC are doing well, she has some itching from the tape but skin is not having any redness, swelling, or soreness.   Patient Active Problem List   Diagnosis Date Noted  . Hepatic abscess 01/18/2019  . Prediabetes 01/06/2018  . Obesity (BMI 35.0-39.9 without comorbidity) 01/06/2018  . CKD (chronic kidney disease) stage 2, GFR 60-89 ml/min 07/07/2017  . Osteopenia 04/20/2017  . Hip pain, chronic, left 03/18/2017  . Lower back pain 03/18/2017  . Sacroiliac joint pain  03/18/2017  . Screening for osteoporosis 12/30/2016  . Preventative health care 12/07/2016  . Encounter for hepatitis C screening test for low risk patient 12/06/2016  . Essential hypertension, benign 03/08/2016  . Cervical pain (neck) 12/31/2014  . Lipoma of skin and subcutaneous tissue of neck 08/07/2014  . Reflux   . Depression   . Bursitis of left hip     Past Surgical History:  Procedure Laterality Date  . ANAL FISSURE REPAIR    . APPENDECTOMY    . [place]  . COLONOSCOPY WITH PROPOFOL N/A 12/18/2014   Procedure: COLONOSCOPY WITH PROPOFOL;  Surgeon: Christene Lye, MD;  Location: [facility] ENDOSCOPY;  Service: Endoscopy;  Laterality: N/A;  . COLONOSCOPY WITH PROPOFOL N/A 02/17/2018   Procedure: COLONOSCOPY WITH PROPOFOL;  Surgeon: Virgel Manifold, MD;  Location: [facility] ENDOSCOPY;  Service: Endoscopy;  Laterality: N/A;  . LIPOMA EXCISION  07/2015   right shoulder  . LIPOMA EXCISION  11/19/2015  . OOPHORECTOMY     for "fibroids" not sure which ovary    Family History  Problem Relation Age of Onset  . Hypertension Mother   . Kidney disease Mother   . Alzheimer's disease Mother   . Thyroid cancer Son   . Lung cancer Brother   . Rectal cancer Brother   . Breast cancer Neg Hx     Social History   Socioeconomic History  . Marital status: Married    Spouse name: Nadara Mustard  .  Number of children: 1  . Years of education: Not on file  . Highest education level: Bachelor's degree (e.g., BA, AB, BS)  Occupational History  . Occupation: Retired  Tobacco Use  . Smoking status: Current Every Day Smoker    Packs/day: 0.50    Years: 15.00    Pack years: 7.50    Types: Cigarettes    Last attempt to quit: 10/24/2016    Years since quitting: 2.2  . Smokeless tobacco: Never Used  Substance and Sexual Activity  . Alcohol use: Yes    Alcohol/week: 4.0 standard drinks    Types: 4 Standard drinks or equivalent per week    Comment: on occasion  . Drug use: No    . Sexual activity: Yes    Partners: Male  Other Topics Concern  . Not on file  Social History Narrative  . Not on file   Social Determinants of Health   Financial Resource Strain: Low Risk   . Difficulty of Paying Living Expenses: Not hard at all  Food Insecurity: No Food Insecurity  . Worried About Charity fundraiser in the Last Year: Never true  . Ran Out of Food in the Last Year: Never true  Transportation Needs: No Transportation Needs  . Lack of Transportation (Medical): No  . Lack of Transportation (Non-Medical): No  Physical Activity: Inactive  . Days of Exercise per Week: 0 days  . Minutes of Exercise per Session: 0 min  Stress: No Stress Concern Present  . Feeling of Stress : Only a little  Social Connections: Not Isolated  . Frequency of Communication with Friends and Family: More than three times a week  . Frequency of Social Gatherings with Friends and Family: Twice a week  . Attends Religious Services: More than 4 times per year  . Active Member of Clubs or Organizations: Yes  . Attends Archivist Meetings: More than 4 times per year  . Marital Status: Married  Human resources officer Violence: Not At Risk  . Fear of Current or Ex-Partner: No  . Emotionally Abused: No  . Physically Abused: No  . Sexually Abused: No     Current Outpatient Medications:  .  Ampicillin-Sulbactam 3 g in sodium chloride 0.9 % 100 mL, Inject 3 g into the vein every 6 (six) hours. q6hrs until 02/15/19, Disp: 28 each, Rfl: 0 .  fluticasone (FLONASE) 50 MCG/ACT nasal spray, Place 2 sprays into both nostrils daily., Disp: 16 g, Rfl: 6 .  HYDROcodone-acetaminophen (NORCO/VICODIN) 5-325 MG tablet, Take 1-2 tablets by mouth every 6 (six) hours as needed for moderate pain or severe pain., Disp: 20 tablet, Rfl: 0 .  ondansetron (ZOFRAN ODT) 4 MG disintegrating tablet, Take 1 tablet (4 mg total) by mouth every 8 (eight) hours as needed for nausea or vomiting., Disp: 20 tablet, Rfl: 0 .   polyethylene glycol (MIRALAX / GLYCOLAX) 17 g packet, Take 17 g by mouth at bedtime as needed for moderate constipation., Disp: 14 each, Rfl: 0 .  aspirin EC 81 MG tablet, Take 81 mg by mouth daily., Disp: , Rfl:  .  Cholecalciferol (VITAMIN D3) 20 MCG TABS, Take 25 mcg by mouth daily., Disp: , Rfl:  .  Cyanocobalamin (B-12) 500 MCG TABS, Take 2,500 mcg by mouth daily. , Disp: , Rfl:  .  Multiple Vitamin (MULTIVITAMIN) tablet, Take 1 tablet by mouth daily., Disp: , Rfl:  .  Omega-3 Fatty Acids (FISH OIL PO), Take 1,200 mg daily by mouth. , Disp: ,  Rfl:  .  senna (SENOKOT) 8.6 MG TABS tablet, Take 1 tablet (8.6 mg total) by mouth at bedtime as needed for mild constipation. (Patient not taking: Reported on 02/01/2019), Disp: 120 tablet, Rfl: 0  No Known Allergies  Chart Review Today: I personally reviewed active problem list, medication list, allergies, family history, social history, health maintenance, notes from last encounter, lab results, imaging with the patient/caregiver today. Reviewed meds, discharge summary, f/up plans and appt  Review of Systems  Constitutional: Negative.   HENT: Negative.   Eyes: Negative.   Respiratory: Negative.   Cardiovascular: Negative.   Gastrointestinal: Negative.   Endocrine: Negative.   Genitourinary: Negative.   Musculoskeletal: Negative.   Skin: Negative.   Allergic/Immunologic: Negative.   Neurological: Negative.   Hematological: Negative.   Psychiatric/Behavioral: Negative.   All other systems reviewed and are negative.    Objective:    Vitals:   02/01/19 1113  BP: 118/70  Pulse: 70  Resp: 14  Temp: (!) 96.8 F (36 C)  TempSrc: Temporal  SpO2: 98%  Weight: 196 lb 8 oz (89.1 kg)  Height: 5\' 4"  (1.626 m)    Body mass index is 33.73 kg/m.  Physical Exam Vitals and nursing note reviewed.  Constitutional:      General: She is not in acute distress.    Appearance: Normal appearance. She is well-developed. She is not  ill-appearing, toxic-appearing or diaphoretic.     Interventions: Face mask in place.  HENT:     Head: Normocephalic and atraumatic.     Right Ear: External ear normal.     Left Ear: External ear normal.  Eyes:     General: Lids are normal. No scleral icterus.       Right eye: No discharge.        Left eye: No discharge.     Conjunctiva/sclera: Conjunctivae normal.  Neck:     Trachea: Phonation normal. No tracheal deviation.  Cardiovascular:     Rate and Rhythm: Normal rate and regular rhythm.     Pulses: Normal pulses.          Radial pulses are 2+ on the right side and 2+ on the left side.       Posterior tibial pulses are 2+ on the right side and 2+ on the left side.     Heart sounds: Normal heart sounds. No murmur. No friction rub. No gallop.   Pulmonary:     Effort: Pulmonary effort is normal. No respiratory distress.     Breath sounds: Normal breath sounds. No stridor. No wheezing, rhonchi or rales.  Chest:     Chest wall: No tenderness.  Abdominal:     General: Bowel sounds are normal. There is no distension.     Palpations: Abdomen is soft.     Tenderness: There is no abdominal tenderness. There is no guarding or rebound.  Musculoskeletal:        General: No deformity. Normal range of motion.     Cervical back: Normal range of motion and neck supple.     Right lower leg: No edema.     Left lower leg: No edema.  Lymphadenopathy:     Cervical: No cervical adenopathy.  Skin:    General: Skin is warm and dry.     Capillary Refill: Capillary refill takes less than 2 seconds.     Coloration: Skin is not jaundiced or pale.     Findings: No rash.     Comments: picc to RUE -  dressing intact, skin surrounding C, D, I  Two drains to abdomen, papertape securing 4x4 gauze, skin surrounding tubes, sutures intact, C, D, I   Neurological:     Mental Status: She is alert and oriented to person, place, and time.     Motor: No abnormal muscle tone.     Gait: Gait normal.    Psychiatric:        Speech: Speech normal.        Behavior: Behavior normal.      PHQ2/9: Depression screen [facility] 2/9 02/01/2019 01/17/2019 12/18/2018 11/17/2018 08/28/2018  Decreased Interest 0 1 0 0 1  Down, Depressed, Hopeless 1 1 0 0 1  PHQ - 2 Score 1 2 0 0 2  Altered sleeping 0 1 0 0 0  Tired, decreased energy 3 1 0 0 0  Change in appetite 0 2 0 0 0  Feeling bad or failure about yourself  0 1 0 0 0  Trouble concentrating 0 0 0 0 0  Moving slowly or fidgety/restless 0 0 0 0 0  Suicidal thoughts 0 0 0 0 0  PHQ-9 Score 4 7 0 0 2  Difficult doing work/chores Somewhat difficult Somewhat difficult Not difficult at all Not difficult at all Not difficult at all  Some recent data might be hidden    phq 9 is positive, improving, secondary to physical sx with illness, reviewed today Fall Risk: Fall Risk  02/01/2019 01/17/2019 12/18/2018 11/17/2018 08/28/2018  Falls in the past year? 0 0 0 0 1  Number falls in past yr: 0 0 0 0 1  Injury with Fall? 0 0 0 0 1  Follow up - - - - -    Functional Status Survey: Is the patient deaf or have difficulty hearing?: No Does the patient have difficulty seeing, even when wearing glasses/contacts?: No Does the patient have difficulty concentrating, remembering, or making decisions?: No Does the patient have difficulty walking or climbing stairs?: No Does the patient have difficulty dressing or bathing?: No Does the patient have difficulty doing errands alone such as visiting a doctor's office or shopping?: No   Assessment & Plan:     ICD-10-CM   1. Hepatic abscess  K75.0    Per ID and GI, pt doing well, is regaining strength, tolerating abx without SE or concerns, weight, VS and appearance good.  2. Encounter for examination following treatment at hospital  Z09    reviewed as per above   Doing paperwork - FMLA for her sister to care for her at home and help with IV abx, appts and care for the next several weeks.  3 month f/up  Delsa Grana,  PA-C 02/01/19 11:56 AM

## 2019-02-04 ENCOUNTER — Encounter: Payer: Self-pay | Admitting: Family Medicine

## 2019-02-05 ENCOUNTER — Other Ambulatory Visit: Payer: Self-pay | Admitting: Gastroenterology

## 2019-02-05 ENCOUNTER — Telehealth: Payer: Self-pay

## 2019-02-05 DIAGNOSIS — K750 Abscess of liver: Secondary | ICD-10-CM

## 2019-02-05 NOTE — Telephone Encounter (Signed)
Patient called this morning complaining of pain in area of Liver Drain. She stated her clip was open when she awoke. I advised her to contact GI and I reached out to PA [facility]ley at [facility]. He has agreed to contact her.

## 2019-02-06 ENCOUNTER — Other Ambulatory Visit: Payer: Self-pay

## 2019-02-06 ENCOUNTER — Ambulatory Visit
Admission: RE | Admit: 2019-02-06 | Discharge: 2019-02-06 | Disposition: A | Discharge status: Home / Self Care | Payer: Medicare Other | Source: Ambulatory Visit | Attending: Gastroenterology | Admitting: Gastroenterology

## 2019-02-06 ENCOUNTER — Other Ambulatory Visit
Admission: RE | Admit: 2019-02-06 | Discharge: 2019-02-06 | Disposition: A | Discharge status: Home / Self Care | Payer: Medicare Other | Source: Ambulatory Visit | Attending: Infectious Diseases | Admitting: Infectious Diseases

## 2019-02-06 DIAGNOSIS — K750 Abscess of liver: Secondary | ICD-10-CM | POA: Insufficient documentation

## 2019-02-06 LAB — COMPREHENSIVE METABOLIC PANEL
ALT: 14 U/L (ref 0–44)
AST: 18 U/L (ref 15–41)
Albumin: 3 g/dL — ABNORMAL LOW (ref 3.5–5.0)
Alkaline Phosphatase: 74 U/L (ref 38–126)
Anion gap: 11 (ref 5–15)
BUN: 13 mg/dL (ref 8–23)
CO2: 26 mmol/L (ref 22–32)
Calcium: 8.8 mg/dL — ABNORMAL LOW (ref 8.9–10.3)
Chloride: 102 mmol/L (ref 98–111)
Creatinine, Ser: 0.67 mg/dL (ref 0.44–1.00)
GFR calc Af Amer: >60 mL/min (ref >60)
GFR calc non Af Amer: >60 mL/min (ref >60)
Glucose, Bld: 111 mg/dL — ABNORMAL HIGH (ref 70–99)
Potassium: 4 mmol/L (ref 3.5–5.1)
Sodium: 139 mmol/L (ref 135–145)
Total Bilirubin: 0.3 mg/dL (ref 0.3–1.2)
Total Protein: 6.1 g/dL — ABNORMAL LOW (ref 6.5–8.1)

## 2019-02-06 MED ORDER — IOHEXOL 300 MG/ML  SOLN
100.0000 mL | Freq: Once | INTRAMUSCULAR | Status: AC | PRN
  Administered 2019-02-06: 08:00:00 100 mL via INTRAVENOUS

## 2019-02-07 ENCOUNTER — Ambulatory Visit
Admission: RE | Admit: 2019-02-07 | Discharge: 2019-02-07 | Disposition: A | Discharge status: Home / Self Care | Payer: Medicare Other | Source: Ambulatory Visit | Attending: Interventional Radiology | Admitting: Interventional Radiology

## 2019-02-07 ENCOUNTER — Encounter: Payer: Self-pay | Admitting: Family Medicine

## 2019-02-07 ENCOUNTER — Other Ambulatory Visit: Payer: Self-pay

## 2019-02-07 ENCOUNTER — Other Ambulatory Visit: Payer: Self-pay | Admitting: Interventional Radiology

## 2019-02-07 DIAGNOSIS — Z87898 Personal history of other specified conditions: Secondary | ICD-10-CM

## 2019-02-07 NOTE — Progress Notes (Signed)
Patient ID: Allison Phillips, female   DOB: 23-May-1950, 69 y.o.   MRN: [id number]      Chief Complaint: Patient was seen in consultation today for F/U HEPATIC ABSCESS DRAINS at the request of Nicola Heinemann  Referring Physician(s): Gerarda Gunther PA  Supervising Physician: Arne Cleveland  Patient Status: [place]  History of Present Illness: Allison Phillips is a 69 y.o. female  presented with intermittent nausea and vomiting, chills and sweats for nearly 2 weeks.  Further evaluation revealed multiple hepatic abscesses . CT guided abscess drain x2 on 01/18/2019. Output has tapered off. CT shows resolution of collections. She is feeling well. Continues on her abx regimen. Presents for drain removal.   Past Medical History:  Diagnosis Date  . Bursitis of left hip   . Depression   . Diverticulitis   . Hypertension   . Joint pain   . Obesity   . Osteopenia 04/20/2017   April 2019; next DEXA April 2021  . Osteoporosis   . Prediabetes   . Reflux   . Tobacco use     Past Surgical History:  Procedure Laterality Date  . ANAL FISSURE REPAIR    . APPENDECTOMY    . [place]  . COLONOSCOPY WITH PROPOFOL N/A 12/18/2014   Procedure: COLONOSCOPY WITH PROPOFOL;  Surgeon: Christene Lye, MD;  Location: [facility] ENDOSCOPY;  Service: Endoscopy;  Laterality: N/A;  . COLONOSCOPY WITH PROPOFOL N/A 02/17/2018   Procedure: COLONOSCOPY WITH PROPOFOL;  Surgeon: Virgel Manifold, MD;  Location: [facility] ENDOSCOPY;  Service: Endoscopy;  Laterality: N/A;  . LIPOMA EXCISION  07/2015   right shoulder  . LIPOMA EXCISION  11/19/2015  . OOPHORECTOMY     for "fibroids" not sure which ovary    Allergies: Patient has no known allergies.  Medications: Prior to Admission medications   Medication Sig Start Date End Date Taking? Authorizing Provider  Ampicillin-Sulbactam 3 g in sodium chloride 0.9 % 100 mL Inject 3 g into the vein every 6 (six) hours. q6hrs until 02/15/19 01/23/19  Yes  Thornell Mule, MD  aspirin EC 81 MG tablet Take 81 mg by mouth daily.   Yes [provider]  Cholecalciferol (VITAMIN D3) 20 MCG TABS Take 25 mcg by mouth daily.   Yes [provider]  Cyanocobalamin (B-12) 500 MCG TABS Take 2,500 mcg by mouth daily.    Yes [provider]  fluticasone (FLONASE) 50 MCG/ACT nasal spray Place 2 sprays into both nostrils daily. 11/17/18  Yes Delsa Grana, PA-C  HYDROcodone-acetaminophen (NORCO/VICODIN) 5-325 MG tablet Take 1-2 tablets by mouth every 6 (six) hours as needed for moderate pain or severe pain. 01/23/19  Yes Thornell Mule, MD  Multiple Vitamin (MULTIVITAMIN) tablet Take 1 tablet by mouth daily.   Yes [provider]  Omega-3 Fatty Acids (FISH OIL PO) Take 1,200 mg daily by mouth.    Yes [provider]  ondansetron (ZOFRAN ODT) 4 MG disintegrating tablet Take 1 tablet (4 mg total) by mouth every 8 (eight) hours as needed for nausea or vomiting. 01/23/19  Yes Thornell Mule, MD  polyethylene glycol (MIRALAX / GLYCOLAX) 17 g packet Take 17 g by mouth at bedtime as needed for moderate constipation. 01/23/19  Yes Thornell Mule, MD  senna (SENOKOT) 8.6 MG TABS tablet Take 1 tablet (8.6 mg total) by mouth at bedtime as needed for mild constipation. 01/23/19  Yes Thornell Mule, MD     Family History  Problem Relation Age of Onset  .  Hypertension Mother   . Kidney disease Mother   . Alzheimer's disease Mother   . Thyroid cancer Son   . Lung cancer Brother   . Rectal cancer Brother   . Breast cancer Neg Hx     Social History   Socioeconomic History  . Marital status: Married    Spouse name: Nadara Mustard  . Number of children: 1  . Years of education: Not on file  . Highest education level: Bachelor's degree (e.g., BA, AB, BS)  Occupational History  . Occupation: Retired  Tobacco Use  . Smoking status: Current Every Day Smoker    Packs/day: 0.50    Years: 15.00    Pack years: 7.50    Types: Cigarettes    Last  attempt to quit: 10/24/2016    Years since quitting: 2.2  . Smokeless tobacco: Never Used  Substance and Sexual Activity  . Alcohol use: Yes    Alcohol/week: 4.0 standard drinks    Types: 4 Standard drinks or equivalent per week    Comment: on occasion  . Drug use: No  . Sexual activity: Yes    Partners: Male  Other Topics Concern  . Not on file  Social History Narrative  . Not on file   Social Determinants of Health   Financial Resource Strain: Low Risk   . Difficulty of Paying Living Expenses: Not hard at all  Food Insecurity: No Food Insecurity  . Worried About Charity fundraiser in the Last Year: Never true  . Ran Out of Food in the Last Year: Never true  Transportation Needs: No Transportation Needs  . Lack of Transportation (Medical): No  . Lack of Transportation (Non-Medical): No  Physical Activity: Inactive  . Days of Exercise per Week: 0 days  . Minutes of Exercise per Session: 0 min  Stress: No Stress Concern Present  . Feeling of Stress : Only a little  Social Connections: Not Isolated  . Frequency of Communication with Friends and Family: More than three times a week  . Frequency of Social Gatherings with Friends and Family: Twice a week  . Attends Religious Services: More than 4 times per year  . Active Member of Clubs or Organizations: Yes  . Attends Archivist Meetings: More than 4 times per year  . Marital Status: Married    ECOG Status: 0 - Asymptomatic  Review of Systems: A 12 point ROS discussed and pertinent positives are indicated in the HPI above.  All other systems are negative.  Review of Systems  Vital Signs: BP (!) 155/74   Pulse 88   Resp 16   LMP  (LMP Unknown) Comment: rt ovary removed  SpO2 98%   Physical Exam Constitutional: Oriented to person, place, and time. Well-developed and well-nourished. No distress.   HENT:  Head: Normocephalic and atraumatic.  Eyes: Conjunctivae and EOM are normal. Right eye exhibits no  discharge. Left eye exhibits no discharge. No scleral icterus.  Neck: No JVD present.  Pulmonary/Chest: Effort normal. No stridor. No respiratory distress.  Abdomen: soft, non distended. Drain sites x2 c/d/i no leaking around drains or skin breakdown. Neurological:  alert and oriented to person, place, and time.  Skin: Skin is warm and dry.  not diaphoretic.  Psychiatric:   normal mood and affect.   behavior is normal. Judgment and thought content normal.  Imaging: DG Chest 2 View  Result Date: 01/18/2019 CLINICAL DATA:  Night sweats. Nausea for the past week. EXAM: CHEST - 2 VIEW COMPARISON:  Chest radiograph 01/17/2019 FINDINGS: The heart size and mediastinal contours are within normal limits. The lungs are clear. No pneumothorax or pleural effusion. The visualized skeletal structures are unremarkable. IMPRESSION: No active cardiopulmonary disease. Electronically Signed   By: Audie Pinto M.D.   On: 01/18/2019 12:54   DG Chest 2 View  Result Date: 01/17/2019 CLINICAL DATA:  Chest pain for 3 weeks. EXAM: CHEST - 2 VIEW COMPARISON:  01/08/2019 FINDINGS: The cardiac silhouette, mediastinal and hilar contours are normal and stable. The lungs are clear. No pleural effusion. The bony thorax is intact. IMPRESSION: No acute cardiopulmonary findings. Electronically Signed   By: Marijo Sanes M.D.   On: 01/17/2019 17:04   DG Abd 1 View  Result Date: 01/17/2019 CLINICAL DATA:  Nausea and vomiting for 1 week. EXAM: ABDOMEN - 1 VIEW COMPARISON:  None. FINDINGS: Scattered air and stool in the colon. No distended small bowel loops to suggest obstruction. The soft tissue shadows of the abdomen are maintained. No worrisome calcifications. The bony structures are unremarkable. IMPRESSION: Unremarkable abdominal radiographs. Electronically Signed   By: Marijo Sanes M.D.   On: 01/17/2019 17:05   CT ABDOMEN PELVIS W CONTRAST  Result Date: 02/06/2019 CLINICAL DATA:  Follow-up hepatic abscess and  percutaneous drains. EXAM: CT ABDOMEN AND PELVIS WITH CONTRAST TECHNIQUE: Multidetector CT imaging of the abdomen and pelvis was performed using the standard protocol following bolus administration of intravenous contrast. CONTRAST:  153mL OMNIPAQUE IOHEXOL 300 MG/ML  SOLN COMPARISON:  CT 01/18/2019 and MRI 01/19/2019 FINDINGS: Lower chest: Lung bases are clear except for mild atelectasis. No pleural effusions. Hepatobiliary: There are 2 percutaneous drains within the liver. The hepatic abscess near the hepatic dome has markedly decreased in size and nearly resolved with the percutaneous drain. Residual low-density material in the hepatic dome abscess is not much larger than the drain pigtail and measures 1.6 x 2.9 cm and previously measured 8.7 x 5.8 cm. The abscess in segment 4 B has basically resolved with the percutaneous drain. The abscess in the inferior left hepatic lobe (segment 3) has decreased in size measuring 3.1 x 2.1 x 2.9 cm and previously measured at least 3.3 x 3.7 x 3.8 cm. Previously, there were small satellite abscess collections around the segment 3 abscess that have essentially resolved. In addition, the fluid component within the segment 3 abscess has markedly decreased. There is a stable hepatic cyst in segment 2 measuring up to 5.0 cm. No new hepatic abscesses. Normal appearance of the gallbladder. Portal venous system is patent. No biliary dilatation. Pancreas: Unremarkable. No pancreatic ductal dilatation or surrounding inflammatory changes. Spleen: Normal in size without focal abnormality. Adrenals/Urinary Tract: Normal adrenal glands. Evidence for a tiny cyst at the left kidney upper pole. No suspicious renal lesions. Negative for hydronephrosis. Normal appearance of the urinary bladder. Stomach/Bowel: Diverticula in the sigmoid colon without inflammatory changes. No evidence for bowel obstruction. Normal appearance of the stomach with small hiatal hernia. Vascular/Lymphatic: Mild  atherosclerotic disease in the abdominal aorta without aneurysm. Celiac trunk, SMA and IMA are patent. Venous structures are unremarkable. No abdominopelvic lymphadenopathy. Reproductive: Uterus and bilateral adnexa are unremarkable. Other: Negative for ascites.  Negative for free air. Musculoskeletal: No acute bone abnormality. IMPRESSION: 1. Hepatic abscesses associated with the percutaneous drains have essentially resolved. Minimal residual low-density material associated with the drain at the hepatic dome. Residual abscess in segment 3 has decreased in size and the small satellite collections have resolved. Markedly decreased fluid component in the residual segment 3  abscess. No new hepatic abscesses. 2. Stable hepatic cyst. 3. Small hiatal hernia. Electronically Signed   By: Markus Daft M.D.   On: 02/06/2019 08:51   CT ABDOMEN PELVIS W CONTRAST  Result Date: 01/18/2019 CLINICAL DATA:  Right shoulder pain, night sweats and n/v x 2 weeks, neg. covid Hx of appy, oophorectomy and c-section EXAM: CT ABDOMEN AND PELVIS WITH CONTRAST TECHNIQUE: Multidetector CT imaging of the abdomen and pelvis was performed using the standard protocol following bolus administration of intravenous contrast. CONTRAST:  150mL OMNIPAQUE IOHEXOL 300 MG/ML  SOLN COMPARISON:  CT abdomen pelvis 07/30/2016 FINDINGS: Lower chest: Small hiatal hernia. Minimal bibasilar atelectasis. Hepatobiliary: There are multiple complex rim enhancing collections in the liver, most likely representing abscesses, the largest in the hepatic dome measures 8.7 x 5.8 cm. There are two additional multilobulated collections in the inferior left hepatic lobe, one of which measures 4.4 x 3.8 cm. No biliary duct dilation. The gallbladder is normal in appearance. The portal vein is patent. Pancreas: Unremarkable. No pancreatic ductal dilatation or surrounding inflammatory changes. Spleen: Normal in size without focal abnormality. Adrenals/Urinary Tract: Adrenal  glands are unremarkable. Kidneys are symmetric in size. No renal calculi. No hydronephrosis. Tiny hypodense lesion in the superior pole the left kidney likely reflects a cyst but is too small fully characterize. Bladder is unremarkable. Stomach/Bowel: Stomach is within normal limits. Appendix is surgically absent. There is bowel wall thickening throughout the sigmoid colon which is likely chronic given no associated inflammatory stranding. Multiple colonic diverticula. No bowel dilation. Vascular/Lymphatic: No significant vascular findings are present. No enlarged abdominal or pelvic lymph nodes. Reproductive: Uterus and bilateral adnexa are unremarkable. Other: No abdominal wall hernia or abnormality. No abdominopelvic ascites. Musculoskeletal: No acute or significant osseous findings. IMPRESSION: 1. Multiple complex rim enhancing collections in the liver, most likely representing abscesses, the largest in the hepatic dome measures 8.7 x 5.8 cm. 2. There is bowel wall thickening throughout the sigmoid colon which is likely secondary to chronic diverticular disease given no associated inflammatory stranding. 3. Small hiatal hernia. These results were called by telephone at the time of interpretation on 01/18/2019 at 1:22 pm to provider Merlyn Lot , who verbally acknowledged these results. Electronically Signed   By: Audie Pinto M.D.   On: 01/18/2019 13:22   MR 3D Recon At Scanner  Result Date: 01/19/2019 CLINICAL DATA:  Multiple liver abscesses EXAM: MRI ABDOMEN WITHOUT AND WITH CONTRAST (INCLUDING MRCP) TECHNIQUE: Multiplanar multisequence MR imaging of the abdomen was performed both before and after the administration of intravenous contrast. Heavily T2-weighted images of the biliary and pancreatic ducts were obtained, and three-dimensional MRCP images were rendered by post processing. CONTRAST:  7mL GADAVIST GADOBUTROL 1 MMOL/ML IV SOLN COMPARISON:  01/18/2019 FINDINGS: Lower chest: No acute  findings. Hepatobiliary: Multiple rim enhancing complex fluid collections are again identified involving segments 8/4a, segment 4b, and segment 3. Interval placement of pigtail drainage catheter within segment 8/4a fluid collection with there decrease in size of abscess. This now measures 5.6 x 4.4 by 2.3 cm, image 100/20 and image 33/21. On the previous exam this fluid collection measured 8.7 x 5.8 by 4.9 cm. The segment 4b fluid collection measures 3.9 x 3.1 by 4.5 cm, image 127/20. Previously 4.5 x 3.9 by 4.2 cm. Segment 3 fluid collection measures 3.9 x 3.5 by 4.2 cm, image 125/2. Previously 3.8 x 3.2 by 3.4 cm. Simple appearing cyst within segment 2 is again noted measuring 4.6 cm. Gallbladder is unremarkable. No gallstones or  gallbladder wall inflammation. No bile duct dilatation. Pancreas: No mass, inflammatory changes, or other parenchymal abnormality identified. Spleen:  Within normal limits in size and appearance. Adrenals/Urinary Tract: Normal appearance of the adrenal glands. No hydronephrosis or mass identified. Several tiny left kidney cysts noted. Stomach/Bowel: Small hiatal hernia. Visualized bowel loops are unremarkable. Vascular/Lymphatic: No pathologically enlarged lymph nodes identified. No abdominal aortic aneurysm demonstrated. Other:  None. Musculoskeletal: No suspicious bone lesions identified. IMPRESSION: 1. Interval decrease in size of dominant fluid collection along the dome of liver status post percutaneous pigtail drainage catheter placement. 2. Smaller abscess within segment 4b is mildly decreased in the interval. Unchanged to slightly increased in volume of segment 3 abscess. 3. Small hiatal hernia. Electronically Signed   By: Kerby Moors M.D.   On: 01/19/2019 13:51   MR ABDOMEN MRCP W WO CONTAST  Result Date: 01/19/2019 CLINICAL DATA:  Multiple liver abscesses EXAM: MRI ABDOMEN WITHOUT AND WITH CONTRAST (INCLUDING MRCP) TECHNIQUE: Multiplanar multisequence MR imaging of the  abdomen was performed both before and after the administration of intravenous contrast. Heavily T2-weighted images of the biliary and pancreatic ducts were obtained, and three-dimensional MRCP images were rendered by post processing. CONTRAST:  39mL GADAVIST GADOBUTROL 1 MMOL/ML IV SOLN COMPARISON:  01/18/2019 FINDINGS: Lower chest: No acute findings. Hepatobiliary: Multiple rim enhancing complex fluid collections are again identified involving segments 8/4a, segment 4b, and segment 3. Interval placement of pigtail drainage catheter within segment 8/4a fluid collection with there decrease in size of abscess. This now measures 5.6 x 4.4 by 2.3 cm, image 100/20 and image 33/21. On the previous exam this fluid collection measured 8.7 x 5.8 by 4.9 cm. The segment 4b fluid collection measures 3.9 x 3.1 by 4.5 cm, image 127/20. Previously 4.5 x 3.9 by 4.2 cm. Segment 3 fluid collection measures 3.9 x 3.5 by 4.2 cm, image 125/2. Previously 3.8 x 3.2 by 3.4 cm. Simple appearing cyst within segment 2 is again noted measuring 4.6 cm. Gallbladder is unremarkable. No gallstones or gallbladder wall inflammation. No bile duct dilatation. Pancreas: No mass, inflammatory changes, or other parenchymal abnormality identified. Spleen:  Within normal limits in size and appearance. Adrenals/Urinary Tract: Normal appearance of the adrenal glands. No hydronephrosis or mass identified. Several tiny left kidney cysts noted. Stomach/Bowel: Small hiatal hernia. Visualized bowel loops are unremarkable. Vascular/Lymphatic: No pathologically enlarged lymph nodes identified. No abdominal aortic aneurysm demonstrated. Other:  None. Musculoskeletal: No suspicious bone lesions identified. IMPRESSION: 1. Interval decrease in size of dominant fluid collection along the dome of liver status post percutaneous pigtail drainage catheter placement. 2. Smaller abscess within segment 4b is mildly decreased in the interval. Unchanged to slightly increased in  volume of segment 3 abscess. 3. Small hiatal hernia. Electronically Signed   By: Kerby Moors M.D.   On: 01/19/2019 13:51   ECHOCARDIOGRAM COMPLETE  Result Date: 01/20/2019   ECHOCARDIOGRAM REPORT   Patient Name:   Allison Phillips Date of Exam: 01/20/2019 Medical Rec #:  [id number]        Height:       67.0 in Accession #:    [id number]       Weight:       216.7 lb Date of Birth:  [birth year]        BSA:          2.09 m Patient Age:    19 years         BP:  119/72 mmHg Patient Gender: F                HR:           60 bpm. Exam Location:  [facility] Procedure: 2D Echo Indications:     Hepatic Abscess  History:         Patient has no prior history of Echocardiogram examinations. No                  Cardiac Hx.  Sonographer:     LTM Referring Phys:  [place] Diagnosing Phys: Ena Dawley MD  Sonographer Comments: No subcostal window due to drainage bandages. IMPRESSIONS  1. Left ventricular ejection fraction, by visual estimation, is 60 to 65%. The left ventricle has normal function. There is mildly increased left ventricular hypertrophy.  2. The left ventricle has no regional wall motion abnormalities.  3. Global right ventricle has normal systolic function.The right ventricular size is normal. No increase in right ventricular wall thickness.  4. Left atrial size was normal.  5. Right atrial size was normal.  6. The mitral valve is normal in structure. No evidence of mitral valve regurgitation. No evidence of mitral stenosis.  7. The tricuspid valve is normal in structure.  8. The aortic valve is normal in structure. Aortic valve regurgitation is not visualized. No evidence of aortic valve sclerosis or stenosis.  9. The pulmonic valve was normal in structure. Pulmonic valve regurgitation is not visualized. 10. Normal pulmonary artery systolic pressure. 11. The tricuspid regurgitant velocity is 2.55 m/s, and with an assumed right atrial pressure of 3 mmHg, the estimated right ventricular systolic  pressure is normal at 29.0 mmHg. 12. The inferior vena cava is normal in size with greater than 50% respiratory variability, suggesting right atrial pressure of 3 mmHg. FINDINGS  Left Ventricle: Left ventricular ejection fraction, by visual estimation, is 60 to 65%. The left ventricle has normal function. The left ventricle has no regional wall motion abnormalities. There is mildly increased left ventricular hypertrophy. Concentric left ventricular hypertrophy. Normal left atrial pressure. Right Ventricle: The right ventricular size is normal. No increase in right ventricular wall thickness. Global RV systolic function is has normal systolic function. The tricuspid regurgitant velocity is 2.55 m/s, and with an assumed right atrial pressure  of 3 mmHg, the estimated right ventricular systolic pressure is normal at 29.0 mmHg. Left Atrium: Left atrial size was normal in size. Right Atrium: Right atrial size was normal in size Pericardium: There is no evidence of pericardial effusion. Mitral Valve: The mitral valve is normal in structure. No evidence of mitral valve regurgitation. No evidence of mitral valve stenosis by observation. Tricuspid Valve: The tricuspid valve is normal in structure. Tricuspid valve regurgitation is mild. Aortic Valve: The aortic valve is normal in structure. Aortic valve regurgitation is not visualized. The aortic valve is structurally normal, with no evidence of sclerosis or stenosis. Aortic valve mean gradient measures 4.0 mmHg. Aortic valve peak gradient measures 7.1 mmHg. Aortic valve area, by VTI measures 1.80 cm. Pulmonic Valve: The pulmonic valve was normal in structure. Pulmonic valve regurgitation is not visualized. Pulmonic regurgitation is not visualized. Aorta: The aortic root, ascending aorta and aortic arch are all structurally normal, with no evidence of dilitation or obstruction. Venous: The inferior vena cava is normal in size with greater than 50% respiratory variability,  suggesting right atrial pressure of 3 mmHg. IAS/Shunts: No atrial level shunt detected by color flow Doppler. There is no evidence  of a patent foramen ovale. No ventricular septal defect is seen or detected. There is no evidence of an atrial septal defect.  LEFT VENTRICLE PLAX 2D LVIDd:         4.39 cm  Diastology LVIDs:         2.92 cm  LV e' lateral:   12.20 cm/s LV PW:         1.04 cm  LV E/e' lateral: 7.3 LV IVS:        1.20 cm  LV e' medial:    12.10 cm/s LVOT diam:     1.70 cm  LV E/e' medial:  7.4 LV SV:         54 ml LV SV Index:   24.84 LVOT Area:     2.27 cm  RIGHT VENTRICLE RV S prime:     15.30 cm/s TAPSE (M-mode): 2.2 cm LEFT ATRIUM             Index LA diam:        3.80 cm 1.82 cm/m LA Vol (A2C):   40.8 ml 19.50 ml/m LA Vol (A4C):   42.4 ml 20.26 ml/m LA Biplane Vol: 43.0 ml 20.55 ml/m  AORTIC VALVE AV Area (Vmax):    1.61 cm AV Area (Vmean):   1.68 cm AV Area (VTI):     1.80 cm AV Vmax:           133.00 cm/s AV Vmean:          91.100 cm/s AV VTI:            0.281 m AV Peak Grad:      7.1 mmHg AV Mean Grad:      4.0 mmHg LVOT Vmax:         94.60 cm/s LVOT Vmean:        67.600 cm/s LVOT VTI:          0.223 m LVOT/AV VTI ratio: 0.79  AORTA Ao Root diam: 3.00 cm MITRAL VALVE                        TRICUSPID VALVE MV Area (PHT): 2.34 cm             TR Peak grad:   26.0 mmHg MV PHT:        94.00 msec           TR Vmax:        255.00 cm/s MV E velocity: 89.20 cm/s 103 cm/s MV A velocity: 50.40 cm/s 70.3 cm/s SHUNTS MV E/A ratio:  1.77       1.5       Systemic VTI:  0.22 m                                     Systemic Diam: 1.70 cm  Ena Dawley MD Electronically signed by Ena Dawley MD Signature Date/Time: 01/20/2019/2:29:03 PM    Final    CT IMAGE GUIDED DRAINAGE BY PERCUTANEOUS CATHETER  Result Date: 01/22/2019 CLINICAL DATA:  Night sweats, right shoulder pain. Multiple hepatic abscesses on recent CT measuring up to 8.7 cm. EXAM: CT GUIDED DRAINAGE OF HEPATIC ABSCESS x2  ANESTHESIA/SEDATION: Intravenous Fentanyl 177mcg and Versed 3mg  were administered as conscious sedation during continuous monitoring of the patient's level of consciousness and physiological / cardiorespiratory status by the radiology RN, with a total moderate sedation time of 18  minutes. PROCEDURE: The procedure, risks, benefits, and alternatives were explained to the patient. Questions regarding the procedure were encouraged and answered. The patient understands and consents to the procedure. Select axial scans through the liver were obtained. The dominant 2 components of multifocal hepatic abscesses were localized, and appropriate skin entry sites were identified and marked. The operative field was prepped with chlorhexidinein a sterile fashion, and a sterile drape was applied covering the operative field. A sterile gown and sterile gloves were used for the procedure. Local anesthesia was provided with 1% Lidocaine. Under CT fluoroscopic guidance, 18 gauge trocar needle advanced into medial left multiloculated collection. Purulent material could be aspirated. Needle exchanged over Amplatz wire for serial dilator and then 12 French pigtail drain catheter, formed centrally with collection. 65 mL purulent material aspirated, sample sent for Gram stain and culture. In similar fashion, under CT fluoroscopic guidance, a new sterile 18 gauge trocar needle advanced into anterior right multiloculated collection. Purulent material could be aspirated. Needle exchanged over Amplatz wire for serial dilator and then 12 French pigtail drain catheter, formed centrally with collection. 125 mL purulent material aspirated, sample sent for Gram stain and culture. CT confirms good position of both drain catheters. Catheters were secured externally with 0 Prolene sutures and StatLock, and placed to gravity drain bags. The patient tolerated the procedure well. COMPLICATIONS: None immediate FINDINGS: Multiloculated hepatic abscess  collections were again localized. 12 French pigtail drain catheter placed into the 2 dominant components, samples of the aspirate sent separately for Gram stain and culture. IMPRESSION: 1. Technically successful CT-guided hepatic abscess drain catheter placement x2. Recommend follow-up CT abdomen with contrast once output diminishes, or in 7 days, or if there is deterioration in patient condition. Electronically Signed   By: Lucrezia Europe M.D.   On: 01/22/2019 09:24   CT IMAGE GUIDED DRAINAGE BY PERCUTANEOUS CATHETER  Result Date: 01/22/2019 CLINICAL DATA:  Night sweats, right shoulder pain. Multiple hepatic abscesses on recent CT measuring up to 8.7 cm. EXAM: CT GUIDED DRAINAGE OF HEPATIC ABSCESS x2 ANESTHESIA/SEDATION: Intravenous Fentanyl 160mcg and Versed 3mg  were administered as conscious sedation during continuous monitoring of the patient's level of consciousness and physiological / cardiorespiratory status by the radiology RN, with a total moderate sedation time of 18 minutes. PROCEDURE: The procedure, risks, benefits, and alternatives were explained to the patient. Questions regarding the procedure were encouraged and answered. The patient understands and consents to the procedure. Select axial scans through the liver were obtained. The dominant 2 components of multifocal hepatic abscesses were localized, and appropriate skin entry sites were identified and marked. The operative field was prepped with chlorhexidinein a sterile fashion, and a sterile drape was applied covering the operative field. A sterile gown and sterile gloves were used for the procedure. Local anesthesia was provided with 1% Lidocaine. Under CT fluoroscopic guidance, 18 gauge trocar needle advanced into medial left multiloculated collection. Purulent material could be aspirated. Needle exchanged over Amplatz wire for serial dilator and then 12 French pigtail drain catheter, formed centrally with collection. 65 mL purulent material  aspirated, sample sent for Gram stain and culture. In similar fashion, under CT fluoroscopic guidance, a new sterile 18 gauge trocar needle advanced into anterior right multiloculated collection. Purulent material could be aspirated. Needle exchanged over Amplatz wire for serial dilator and then 12 French pigtail drain catheter, formed centrally with collection. 125 mL purulent material aspirated, sample sent for Gram stain and culture. CT confirms good position of both drain catheters. Catheters were secured  externally with 0 Prolene sutures and StatLock, and placed to gravity drain bags. The patient tolerated the procedure well. COMPLICATIONS: None immediate FINDINGS: Multiloculated hepatic abscess collections were again localized. 12 French pigtail drain catheter placed into the 2 dominant components, samples of the aspirate sent separately for Gram stain and culture. IMPRESSION: 1. Technically successful CT-guided hepatic abscess drain catheter placement x2. Recommend follow-up CT abdomen with contrast once output diminishes, or in 7 days, or if there is deterioration in patient condition. Electronically Signed   By: Lucrezia Europe M.D.   On: 01/22/2019 09:24   [place] EKG SITE RITE  Result Date: 01/22/2019 If Site Rite image not attached, placement could not be confirmed due to current cardiac rhythm.   Labs:  CBC: Recent Labs    01/20/19 0458 01/21/19 0939 01/22/19 0408 01/23/19 0643  WBC 15.6* 14.7* 14.7* 12.5*  HGB 10.3* 11.5* 11.3* 10.9*  HCT 28.7* 31.4* 30.6* 31.6*  PLT 555* 609* 638* 620*    COAGS: Recent Labs    01/18/19 1258  INR 1.2    BMP: Recent Labs    01/18/19 1054 01/19/19 0654 01/20/19 0458 02/06/19 1550  NA 130* 135 134* 139  K 4.6 4.5 4.2 4.0  CL 95* 102 99 102  CO2 22 23 22 26   GLUCOSE 112* 111* 92 111*  BUN 19 12 14 13   CALCIUM 9.2 8.5* 8.5* 8.8*  CREATININE 0.81 0.77 0.71 0.67  GFRNONAA >60 >60 >60 >60  GFRAA >60 >60 >60 >60    LIVER FUNCTION  TESTS: Recent Labs    01/18/19 1054 01/19/19 0654 01/20/19 0458 02/06/19 1550  BILITOT 0.9 0.7 0.7 0.3  AST 48* 41 25 18  ALT 73* 58* 47* 14  ALKPHOS 277* 207* 199* 74  PROT 8.3* 6.6 6.8 6.1*  ALBUMIN 3.1* 2.3* 2.4* 3.0*    TUMOR MARKERS: No results for input(s): AFPTM, CEA, CA199, CHROMGRNA in the last 8760 hours.  Assessment and Plan: My impression is that she has done well post liver abscess drain catheter placement x2. CT shows resolution of abscess pockets, no new or undrained components. Output minimal.   THe catheters were sequentially removed in their entirety, without complication.  We discussed site care.  She will f/u with her PCP and finish abx regimen. I can see her back on an as-needed basis.  Thank you for this interesting consult.  I greatly enjoyed meeting TYKESHA CULVERSON and look forward to participating in their care.  A copy of this report was sent to the requesting provider on this date.  Electronically Signed: Rickard Rhymes, MD 02/07/2019, 12:06 PM   I spent a total of    15 Minutes in face to face in clinical consultation, greater than 50% of which was counseling/coordinating care for hepatic abscess drain removal.

## 2019-02-08 ENCOUNTER — Encounter: Payer: Self-pay | Admitting: Infectious Diseases

## 2019-02-08 ENCOUNTER — Other Ambulatory Visit: Payer: Self-pay | Admitting: Infectious Diseases

## 2019-02-08 ENCOUNTER — Ambulatory Visit: Payer: Medicare Other | Attending: Infectious Diseases | Admitting: Infectious Diseases

## 2019-02-08 VITALS — BP 152/85 | HR 63 | Temp 98.5°F | Resp 16 | Ht 64.0 in | Wt 196.3 lb

## 2019-02-08 DIAGNOSIS — F17210 Nicotine dependence, cigarettes, uncomplicated: Secondary | ICD-10-CM | POA: Diagnosis not present

## 2019-02-08 DIAGNOSIS — Z801 Family history of malignant neoplasm of trachea, bronchus and lung: Secondary | ICD-10-CM | POA: Diagnosis not present

## 2019-02-08 DIAGNOSIS — Z808 Family history of malignant neoplasm of other organs or systems: Secondary | ICD-10-CM | POA: Insufficient documentation

## 2019-02-08 DIAGNOSIS — Z820 Family history of epilepsy and other diseases of the nervous system: Secondary | ICD-10-CM | POA: Diagnosis not present

## 2019-02-08 DIAGNOSIS — Z800 Family history of malignant neoplasm of digestive organs: Secondary | ICD-10-CM | POA: Insufficient documentation

## 2019-02-08 DIAGNOSIS — Z841 Family history of disorders of kidney and ureter: Secondary | ICD-10-CM | POA: Insufficient documentation

## 2019-02-08 DIAGNOSIS — I10 Essential (primary) hypertension: Secondary | ICD-10-CM | POA: Diagnosis not present

## 2019-02-08 DIAGNOSIS — Z8249 Family history of ischemic heart disease and other diseases of the circulatory system: Secondary | ICD-10-CM | POA: Insufficient documentation

## 2019-02-08 DIAGNOSIS — E669 Obesity, unspecified: Secondary | ICD-10-CM | POA: Diagnosis not present

## 2019-02-08 DIAGNOSIS — Z79899 Other long term (current) drug therapy: Secondary | ICD-10-CM | POA: Insufficient documentation

## 2019-02-08 DIAGNOSIS — M8580 Other specified disorders of bone density and structure, unspecified site: Secondary | ICD-10-CM | POA: Insufficient documentation

## 2019-02-08 DIAGNOSIS — Z792 Long term (current) use of antibiotics: Secondary | ICD-10-CM | POA: Insufficient documentation

## 2019-02-08 DIAGNOSIS — Z6833 Body mass index (BMI) 33.0-33.9, adult: Secondary | ICD-10-CM | POA: Diagnosis not present

## 2019-02-08 DIAGNOSIS — K750 Abscess of liver: Secondary | ICD-10-CM | POA: Insufficient documentation

## 2019-02-08 NOTE — Patient Instructions (Addendum)
You are here for follow up of liver abscess, the drains have been removed yesterday, you are on unasyn IV 4 times a day and your last day is 1/28.21. After that the PICC line can  be removed

## 2019-02-08 NOTE — Progress Notes (Signed)
NAME: Allison Phillips  DOB: Dec 08, 1950  MRN: [id number]  Date/Time: 02/08/2019 9:17 AM   Subjective:    ? Allison Phillips is a 69 y.o. female with a history of HTN Was recently in hospital for liver abscess and discharged on IV unasyn and liver drains is here for follow up. Pt doing better, feeling tired Had CT abdomen and as the abscesses were almost resolved both drains were removed by IR. Pt is also followed by GI at [facility]. Last labs from 1/19 shows normal LFTS, platelet  599. WBC N She has no fever, chills, diarrhea, abdominal pain, nausea vomiting or poor appetite. Feels tired . Her husband and sister helps with Iv unasyn   Past Medical History:  Diagnosis Date  . Bursitis of left hip   . Depression   . Diverticulitis   . Hypertension   . Joint pain   . Obesity   . Osteopenia 04/20/2017   April 2019; next DEXA April 2021  . Osteoporosis   . Prediabetes   . Reflux   . Tobacco use     Past Surgical History:  Procedure Laterality Date  . ANAL FISSURE REPAIR    . APPENDECTOMY    . [place]  . COLONOSCOPY WITH PROPOFOL N/A 12/18/2014   Procedure: COLONOSCOPY WITH PROPOFOL;  Surgeon: Christene Lye, MD;  Location: [facility] ENDOSCOPY;  Service: Endoscopy;  Laterality: N/A;  . COLONOSCOPY WITH PROPOFOL N/A 02/17/2018   Procedure: COLONOSCOPY WITH PROPOFOL;  Surgeon: Virgel Manifold, MD;  Location: [facility] ENDOSCOPY;  Service: Endoscopy;  Laterality: N/A;  . LIPOMA EXCISION  07/2015   right shoulder  . LIPOMA EXCISION  11/19/2015  . OOPHORECTOMY     for "fibroids" not sure which ovary    Social History   Socioeconomic History  . Marital status: Married    Spouse name: Nadara Mustard  . Number of children: 1  . Years of education: Not on file  . Highest education level: Bachelor's degree (e.g., BA, AB, BS)  Occupational History  . Occupation: Retired  Tobacco Use  . Smoking status: Current Every Day Smoker    Packs/day: 0.50    Years: 15.00     Pack years: 7.50    Types: Cigarettes    Last attempt to quit: 10/24/2016    Years since quitting: 2.2  . Smokeless tobacco: Never Used  Substance and Sexual Activity  . Alcohol use: Yes    Alcohol/week: 4.0 standard drinks    Types: 4 Standard drinks or equivalent per week    Comment: on occasion  . Drug use: No  . Sexual activity: Yes    Partners: Male  Other Topics Concern  . Not on file  Social History Narrative  . Not on file   Social Determinants of Health   Financial Resource Strain: Low Risk   . Difficulty of Paying Living Expenses: Not hard at all  Food Insecurity: No Food Insecurity  . Worried About Charity fundraiser in the Last Year: Never true  . Ran Out of Food in the Last Year: Never true  Transportation Needs: No Transportation Needs  . Lack of Transportation (Medical): No  . Lack of Transportation (Non-Medical): No  Physical Activity: Inactive  . Days of Exercise per Week: 0 days  . Minutes of Exercise per Session: 0 min  Stress: No Stress Concern Present  . Feeling of Stress : Only a little  Social Connections: Not Isolated  . Frequency of Communication with Friends  and Family: More than three times a week  . Frequency of Social Gatherings with Friends and Family: Twice a week  . Attends Religious Services: More than 4 times per year  . Active Member of Clubs or Organizations: Yes  . Attends Archivist Meetings: More than 4 times per year  . Marital Status: Married  Human resources officer Violence: Not At Risk  . Fear of Current or Ex-Partner: No  . Emotionally Abused: No  . Physically Abused: No  . Sexually Abused: No    Family History  Problem Relation Age of Onset  . Hypertension Mother   . Kidney disease Mother   . Alzheimer's disease Mother   . Thyroid cancer Son   . Lung cancer Brother   . Rectal cancer Brother   . Breast cancer Neg Hx    No Known Allergies  ? Current Outpatient Medications  Medication Sig Dispense Refill  .  Ampicillin-Sulbactam 3 g in sodium chloride 0.9 % 100 mL Inject 3 g into the vein every 6 (six) hours. q6hrs until 02/15/19 28 each 0  . polyethylene glycol (MIRALAX / GLYCOLAX) 17 g packet Take 17 g by mouth at bedtime as needed for moderate constipation. 14 each 0  . Cholecalciferol (VITAMIN D3) 20 MCG TABS Take 25 mcg by mouth daily.    . Cyanocobalamin (B-12) 500 MCG TABS Take 2,500 mcg by mouth daily.     . fluticasone (FLONASE) 50 MCG/ACT nasal spray Place 2 sprays into both nostrils daily. (Patient not taking: Reported on 02/08/2019) 16 g 6  . Multiple Vitamin (MULTIVITAMIN) tablet Take 1 tablet by mouth daily.    . Omega-3 Fatty Acids (FISH OIL PO) Take 1,200 mg daily by mouth.     . ondansetron (ZOFRAN ODT) 4 MG disintegrating tablet Take 1 tablet (4 mg total) by mouth every 8 (eight) hours as needed for nausea or vomiting. (Patient not taking: Reported on 02/08/2019) 20 tablet 0  . senna (SENOKOT) 8.6 MG TABS tablet Take 1 tablet (8.6 mg total) by mouth at bedtime as needed for mild constipation. (Patient not taking: Reported on 02/08/2019) 120 tablet 0   No current facility-administered medications for this visit.     Abtx:  Anti-infectives (From admission, onward)   None      REVIEW OF SYSTEMS:  Const: negative fever, negative chills, negative weight loss Eyes: negative diplopia or visual changes, negative eye pain ENT: negative coryza, negative sore throat Resp: negative cough, hemoptysis, dyspnea Cards: negative for chest pain, palpitations, lower extremity edema GU: negative for frequency, dysuria and hematuria GI: Negative for abdominal pain, diarrhea, bleeding, constipation Skin: negative for rash and pruritus Heme: negative for easy bruising and gum/nose bleeding MS: negative for myalgias, arthralgias, back pain and muscle weakness Neurolo:negative for headaches, dizziness, vertigo, memory problems  Psych: negative for feelings of anxiety, depression  Endocrine: negative  for thyroid, diabetes Allergy/Immunology- negative for any medication or food allergies ?  Objective:  VITALS:  BP (!) 152/85   Pulse 63   Temp 98.5 F (36.9 C)   Resp 16   Ht 5\' 4"  (1.626 m)   Wt 196 lb 4.8 oz (89 kg)   LMP  (LMP Unknown) Comment: rt ovary removed  SpO2 97%   BMI 33.69 kg/m  PHYSICAL EXAM:  General: Alert, cooperative, no distress, appears stated age.  Head: Normocephalic, without obvious abnormality, atraumatic. Eyes: Conjunctivae clear, anicteric sclerae. Pupils are equal ENT Nares normal. No drainage or sinus tenderness. Lips, mucosa, and tongue  normal. No Thrush Neck: Supple, symmetrical, no adenopathy, thyroid: non tender no carotid bruit and no JVD. Lungs: Clear to auscultation bilaterally. No Wheezing or Rhonchi. No rales. Heart: Regular rate and rhythm, no murmur, rub or gallop. Abdomen: Soft, non-tender,not distended. Bowel sounds normal. No masses- previous drain sites covered wit dressing Extremities: rt picc site clean atraumatic, no cyanosis. No edema. No clubbing Skin: No rashes or lesions. Or bruising Lymph: Cervical, supraclavicular normal. Neurologic: Grossly non-focal Pertinent Labs Lab Results CBC    Component Value Date/Time   WBC 12.5 (H) 01/23/2019 0643   RBC 3.91 01/23/2019 0643   HGB 10.9 (L) 01/23/2019 0643   HGB 12.8 11/26/2015 1429   HCT 31.6 (L) 01/23/2019 0643   HCT 38.5 11/26/2015 1429   PLT 620 (H) 01/23/2019 0643   PLT 307 11/26/2015 1429   MCV 80.8 01/23/2019 0643   MCV 84 11/26/2015 1429   MCH 27.9 01/23/2019 0643   MCHC 34.5 01/23/2019 0643   RDW 12.9 01/23/2019 0643   RDW 14.6 11/26/2015 1429   LYMPHSABS 2.2 01/23/2019 0643   LYMPHSABS 2.8 11/26/2015 1429   MONOABS 0.8 01/23/2019 0643   EOSABS 0.1 01/23/2019 0643   EOSABS 0.2 11/26/2015 1429   BASOSABS 0.1 01/23/2019 0643   BASOSABS 0.1 11/26/2015 1429    CMP Latest Ref Rng & Units 02/06/2019 01/20/2019 01/19/2019  Glucose 70 - 99 mg/dL 111(H) 92 111(H)   BUN 8 - 23 mg/dL 13 14 12   Creatinine 0.44 - 1.00 mg/dL 0.67 0.71 0.77  Sodium 135 - 145 mmol/L 139 134(L) 135  Potassium 3.5 - 5.1 mmol/L 4.0 4.2 4.5  Chloride 98 - 111 mmol/L 102 99 102  CO2 22 - 32 mmol/L 26 22 23   Calcium 8.9 - 10.3 mg/dL 8.8(L) 8.5(L) 8.5(L)  Total Protein 6.5 - 8.1 g/dL 6.1(L) 6.8 6.6  Total Bilirubin 0.3 - 1.2 mg/dL 0.3 0.7 0.7  Alkaline Phos 38 - 126 U/L 74 199(H) 207(H)  AST 15 - 41 U/L 18 25 41  ALT 0 - 44 U/L 14 47(H) 58(H)      IMAGING RESULTS: CT Hepatic abscesses associated with the percutaneous drains have essentially resolved. Minimal residual low-density material associated with the drain at the hepatic dome. Residual abscess in segment 3 has decreased in size and the small satellite collections have resolved. Markedly decreased fluid component in the residual segment 3 abscess. No new hepatic abscesses  I have personally reviewed the films ? Impression/Recommendation ?Liver abscesses- multiple- anerobic bacteria -fusobacterium cultured- Intestinal organism.  Could be from the chronic diverticular disease. ( no acute inflammation)  MRI/MRCP no gall bladder pathology Followed by GI, plan for endoscopy in near future Both liver drains have been removed yesterday after repeat CT showed near resolution of abscesses   is on unasyn and will finish 4 weeks on 02/15/19 PICC will be removed after that Repeat labs -  lfts have normalized  Follow up PRN ? ___________________________________________________ Discussed with patient

## 2019-02-10 ENCOUNTER — Other Ambulatory Visit: Payer: Self-pay | Admitting: Family Medicine

## 2019-02-12 ENCOUNTER — Telehealth: Payer: Self-pay

## 2019-02-12 NOTE — Progress Notes (Signed)
Pt presents for removal of liver abscess drain catheters. Recent CT shows resolution of collections, and there is no further output. Pt is afebrile, continuing her course of abx. Liver abscess drain catheters cut and removed uneventfully. Site care discussed with pt.

## 2019-02-12 NOTE — Telephone Encounter (Signed)
Last dose IV Abx will be this Thursday 11 pm. Requesting we send order to remove PICC Friday. I will call with verbal and have them fax [place] orders to sign if approced by Ravishankar.

## 2019-02-14 ENCOUNTER — Telehealth: Payer: Self-pay

## 2019-02-14 NOTE — Telephone Encounter (Signed)
Verbal order given to Coretta to D/C PICC on 02/15/2019.

## 2019-02-14 NOTE — Telephone Encounter (Signed)
Yes Please.  PICC can be removed

## 2019-02-15 NOTE — Telephone Encounter (Signed)
Verbal given 

## 2019-02-20 ENCOUNTER — Ambulatory Visit (INDEPENDENT_AMBULATORY_CARE_PROVIDER_SITE_OTHER): Payer: Medicare Other | Admitting: Family Medicine

## 2019-02-20 ENCOUNTER — Other Ambulatory Visit: Payer: Self-pay | Admitting: Infectious Diseases

## 2019-02-20 ENCOUNTER — Encounter: Payer: Self-pay | Admitting: Family Medicine

## 2019-02-20 VITALS — Temp 97.8°F | Ht 64.0 in | Wt 196.0 lb

## 2019-02-20 DIAGNOSIS — M546 Pain in thoracic spine: Secondary | ICD-10-CM | POA: Diagnosis not present

## 2019-02-20 NOTE — Progress Notes (Signed)
Name: Allison Phillips   MRN: [id number]    DOB: [birth year]   Date:02/20/2019       Progress Note  Subjective:    Chief Complaint  Chief Complaint  Patient presents with  . Back Pain    Onset-months, upper right side back, pain is intermittently with no reason can become a pain level 8 with sharp pains.    I connected with  [name]  on 02/20/19 at  9:00 AM EST by a video enabled telemedicine application and verified that I am speaking with the correct person using two identifiers.  I discussed the limitations of evaluation and management by telemedicine and the availability of in person appointments. The patient expressed understanding and agreed to proceed. Staff also discussed with the patient that there may be a patient responsible charge related to this service. Patient Location: home Provider Location: [facility] Additional Individuals present: none  HPI   Right upper thoracic/scapular pain to back, not new, ongoing for at least 6 months prior to me seeing her for same sx/pain in October 2020. Coming and going pain, sharp and severe when it occurs, comes randomly, shoots from right scapula to midline spine, only lasts a few seconds, no alleviating or aggravating factors - no position movement or activity that seems to bring it on or relieve it  We tried mobic and naproxen in the past, she is not currently on either.  In past in person visits the area of pain was easily reproduced with palpation to right rhomboid muscle.  CXR done in the past normal.  No change to pain with deep inspiration. No past right shoulder or scapula injury or problems.   She sees ortho for hip pain, but I do not believe she has been seen by ortho or PT for this.    She had liver abscess drains and PICC for IV abx, both out now, doing better, following up with ID.  She is still concerned with "what caused it" and wants to know how to monitor so it doesn't happen again.      Patient Active Problem  List   Diagnosis Date Noted  . Hepatic abscess 01/18/2019  . Prediabetes 01/06/2018  . Obesity (BMI 35.0-39.9 without comorbidity) 01/06/2018  . CKD (chronic kidney disease) stage 2, GFR 60-89 ml/min 07/07/2017  . Osteopenia 04/20/2017  . Hip pain, chronic, left 03/18/2017  . Lower back pain 03/18/2017  . Sacroiliac joint pain 03/18/2017  . Screening for osteoporosis 12/30/2016  . Preventative health care 12/07/2016  . Encounter for hepatitis C screening test for low risk patient 12/06/2016  . Essential hypertension, benign 03/08/2016  . Cervical pain (neck) 12/31/2014  . Lipoma of skin and subcutaneous tissue of neck 08/07/2014  . Reflux   . Depression   . Bursitis of left hip     Social History   Tobacco Use  . Smoking status: Current Every Day Smoker    Packs/day: 0.50    Years: 15.00    Pack years: 7.50    Types: Cigarettes    Last attempt to quit: 10/24/2016    Years since quitting: 2.3  . Smokeless tobacco: Never Used  Substance Use Topics  . Alcohol use: Yes    Alcohol/week: 4.0 standard drinks    Types: 4 Standard drinks or equivalent per week    Comment: on occasion     Current Outpatient Medications:  .  fluticasone (FLONASE) 50 MCG/ACT nasal spray, Place 2 sprays into both nostrils daily. (  Patient taking differently: Place 2 sprays into both nostrils as needed. ), Disp: 16 g, Rfl: 6 .  Multiple Vitamin (MULTIVITAMIN) tablet, Take 1 tablet by mouth daily. Centrum Silver, Disp: , Rfl:  .  polyethylene glycol (MIRALAX / GLYCOLAX) 17 g packet, Take 17 g by mouth at bedtime as needed for moderate constipation., Disp: 14 each, Rfl: 0 .  Ampicillin-Sulbactam 3 g in sodium chloride 0.9 % 100 mL, Inject 3 g into the vein every 6 (six) hours. q6hrs until 02/15/19 (Patient not taking: Reported on 02/20/2019), Disp: 28 each, Rfl: 0 .  Cholecalciferol (VITAMIN D3) 20 MCG TABS, Take 25 mcg by mouth daily., Disp: , Rfl:  .  Cyanocobalamin (B-12) 500 MCG TABS, Take 2,500 mcg by  mouth daily. , Disp: , Rfl:  .  naproxen (NAPROSYN) 500 MG tablet, TAKE 1 TABLET(500 MG) BY MOUTH TWICE DAILY AS NEEDED FOR MODERATE PAIN (Patient not taking: Reported on 02/20/2019), Disp: 30 tablet, Rfl: 0 .  Omega-3 Fatty Acids (FISH OIL PO), Take 1,200 mg daily by mouth. , Disp: , Rfl:  .  ondansetron (ZOFRAN ODT) 4 MG disintegrating tablet, Take 1 tablet (4 mg total) by mouth every 8 (eight) hours as needed for nausea or vomiting. (Patient not taking: Reported on 02/08/2019), Disp: 20 tablet, Rfl: 0 .  senna (SENOKOT) 8.6 MG TABS tablet, Take 1 tablet (8.6 mg total) by mouth at bedtime as needed for mild constipation. (Patient not taking: Reported on 02/08/2019), Disp: 120 tablet, Rfl: 0  No Known Allergies   Review of Systems  10 Systems reviewed and are negative for acute change except as noted in the HPI.   Objective:   Virtual encounter, vitals limited, only able to obtain the following Today's Vitals   02/20/19 0825  Temp: 97.8 F (36.6 C)  TempSrc: Temporal  Weight: 196 lb (88.9 kg)  Height: 5\' 4"  (1.626 m)   Body mass index is 33.64 kg/m. Nursing Note and Vital Signs reviewed.  Physical Exam Vitals and nursing note reviewed.  Constitutional:      General: She is not in acute distress.    Appearance: Normal appearance. She is well-developed. She is not ill-appearing, toxic-appearing or diaphoretic.  HENT:     Head: Normocephalic and atraumatic.  Eyes:     General:        Right eye: No discharge.        Left eye: No discharge.     Conjunctiva/sclera: Conjunctivae normal.  Neck:     Trachea: No tracheal deviation.  Cardiovascular:     Rate and Rhythm: Normal rate.  Pulmonary:     Effort: Pulmonary effort is normal. No respiratory distress.     Breath sounds: No stridor.  Skin:    Coloration: Skin is not jaundiced or pale.     Findings: No rash.  Neurological:     Mental Status: She is alert.  Psychiatric:        Mood and Affect: Mood normal.        Behavior:  Behavior normal.     PE limited by telephone encounter  No results found for this or any previous visit (from the past 72 hour(s)).  Assessment and Plan:     ICD-10-CM   1. Acute right-sided thoracic back pain  M54.6    between scapula and thoracic spine, same pain as previously assessed multiple times, suspect MSK - encouraged pt to do PT, may need ortho     -Red flags and when to present for  emergency care or RTC including fever >101.12F, chest pain, shortness of breath, new/worsening/un-resolving symptoms, reviewed with patient at time of visit. Follow up and care instructions discussed and provided in AVS. - I discussed the assessment and treatment plan with the patient. The patient was provided an opportunity to ask questions and all were answered. The patient agreed with the plan and demonstrated an understanding of the instructions.  I provided 12 minutes of non-face-to-face time during this encounter.  Delsa Grana, PA-C 02/20/19 9:21 AM

## 2019-02-27 ENCOUNTER — Other Ambulatory Visit: Payer: Self-pay | Admitting: Infectious Diseases

## 2019-02-28 ENCOUNTER — Ambulatory Visit (INDEPENDENT_AMBULATORY_CARE_PROVIDER_SITE_OTHER): Payer: Medicare Other | Admitting: Family Medicine

## 2019-02-28 DIAGNOSIS — Z5329 Procedure and treatment not carried out because of patient's decision for other reasons: Secondary | ICD-10-CM

## 2019-03-02 ENCOUNTER — Ambulatory Visit: Payer: Medicare Other | Attending: Internal Medicine

## 2019-03-02 ENCOUNTER — Other Ambulatory Visit: Payer: Self-pay

## 2019-03-02 ENCOUNTER — Telehealth: Payer: Self-pay

## 2019-03-02 DIAGNOSIS — Z23 Encounter for immunization: Secondary | ICD-10-CM | POA: Insufficient documentation

## 2019-03-02 DIAGNOSIS — M546 Pain in thoracic spine: Secondary | ICD-10-CM

## 2019-03-02 NOTE — Telephone Encounter (Signed)
Copied from [place] [id number]. Topic: General - Other >> Mar 01, 2019  4:23 PM Celene Kras wrote: Reason for CRM: Pt called and is requesting to have a referral sent in for PT and OT. Please advise.

## 2019-03-02 NOTE — Progress Notes (Signed)
   Covid-19 Vaccination Clinic  Name:  Allison Phillips    MRN: [id number] DOB: [birth year]  03/02/2019  [name] was observed post Covid-19 immunization for 15 minutes without incidence. She was provided with Vaccine Information Sheet and instruction to access the V-Safe system.   [name] was instructed to call 911 with any severe reactions post vaccine: Marland Kitchen Difficulty breathing  . Swelling of your face and throat  . A fast heartbeat  . A bad rash all over your body  . Dizziness and weakness    Immunizations Administered    Name Date Dose VIS Date Route   Moderna COVID-19 Vaccine 03/02/2019 10:00 AM 0.5 mL 12/19/2018 Intramuscular   Manufacturer: Moderna   Lot: [id number]   [id number]

## 2019-03-07 ENCOUNTER — Ambulatory Visit
Admission: RE | Admit: 2019-03-07 | Discharge: 2019-03-07 | Disposition: A | Discharge status: Home / Self Care | Payer: Medicare Other | Source: Ambulatory Visit | Attending: Family Medicine | Admitting: Family Medicine

## 2019-03-07 DIAGNOSIS — Z1231 Encounter for screening mammogram for malignant neoplasm of breast: Secondary | ICD-10-CM

## 2019-03-13 NOTE — Progress Notes (Signed)
Encounter created in error

## 2019-03-14 ENCOUNTER — Ambulatory Visit: Payer: Medicare Other | Attending: Family Medicine | Admitting: Physical Therapy

## 2019-03-14 ENCOUNTER — Other Ambulatory Visit: Payer: Self-pay

## 2019-03-14 DIAGNOSIS — R293 Abnormal posture: Secondary | ICD-10-CM

## 2019-03-14 DIAGNOSIS — M6281 Muscle weakness (generalized): Secondary | ICD-10-CM

## 2019-03-14 DIAGNOSIS — M546 Pain in thoracic spine: Secondary | ICD-10-CM | POA: Diagnosis present

## 2019-03-14 NOTE — Therapy (Signed)
[facility] [address] [place], [place], [address] Phone: [phone]   Fax:  [phone]  Physical Therapy Evaluation  Patient Details  Name: NYOBI HERRIAGE MRN: [id number] Date of Birth: [birth year] Referring Provider (PT): Delsa Grana   Encounter Date: 03/14/2019  PT End of Session - 03/14/19 1846    Visit Number  1    Number of Visits  9    Date for PT Re-Evaluation  05/09/19    Authorization Type  IE 03/14/2019    PT Start Time  1701    PT Stop Time  1802    PT Time Calculation (min)  61 min    Activity Tolerance  Patient tolerated treatment well    Behavior During Therapy  [facility] for tasks assessed/performed       Past Medical History:  Diagnosis Date  . Bursitis of left hip   . Depression   . Diverticulitis   . Hypertension   . Joint pain   . Obesity   . Osteopenia 04/20/2017   April 2019; next DEXA April 2021  . Osteoporosis   . Prediabetes   . Reflux   . Tobacco use     Past Surgical History:  Procedure Laterality Date  . ANAL FISSURE REPAIR    . APPENDECTOMY    . [place]  . COLONOSCOPY WITH PROPOFOL N/A 12/18/2014   Procedure: COLONOSCOPY WITH PROPOFOL;  Surgeon: Christene Lye, MD;  Location: [facility] ENDOSCOPY;  Service: Endoscopy;  Laterality: N/A;  . COLONOSCOPY WITH PROPOFOL N/A 02/17/2018   Procedure: COLONOSCOPY WITH PROPOFOL;  Surgeon: Virgel Manifold, MD;  Location: [facility] ENDOSCOPY;  Service: Endoscopy;  Laterality: N/A;  . LIPOMA EXCISION  07/2015   right shoulder  . LIPOMA EXCISION  11/19/2015  . OOPHORECTOMY     for "fibroids" not sure which ovary    There were no vitals filed for this visit.       [facility] PT Assessment - 03/14/19 0001      Assessment   Medical Diagnosis  R sided thoracic pain    Referring Provider (PT)  Delsa Grana    Onset Date/Surgical Date  08/11/18    Hand Dominance  Right    Next MD Visit  03/19/2019    Prior Therapy  Yes, for ankle       Precautions   Precautions  None      Balance Screen   Has the patient fallen in the past 6 months  No       SUBJECTIVE Chief complaint:  Patient states that she has pain at the base of her shoulder blade that is intermittent. Patient notes that this pain comes on when she gets tired. Patient reports that this pain is aggravated by a lot of cleaning/housechores. The pain spreads to the middle of the back.    Per MD note: R upper thoracic/scapular pain to back, not new, ongoing for at least 6 months prior to me seeing her for same sx/pain in October 2020. Coming and going pain, sharp and severe when it occurs, comes randomly, shoots from right scapula to midline spine, only lasts a few seconds, no alleviating or aggravating factors - no position movement or activity that seems to bring it on or relieve it   Pain: 0/10 Present,  0/10 Best, 7/10 Worst Aggravating factors: working/doing chores; peeling vegetables/fruits, cooking (stirring/chopping)  Easing factors:  24 hour pain behavior: activity dependent Recent back trauma: No Prior history of  back injury or pain: No Pain quality: pain quality: aching and pulling Radiating pain: No  Numbness/Tingling: No Imaging: No  Occupational demands: retired; 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  Recent memory is intact.  Remote memory is intact.  Attention span and concentration are intact.  Expressive speech is intact.  Patient's fund of knowledge is within normal limits for educational level.  SENSATION: Grossly intact to light touch bilateral LEs as determined by testing dermatomes L2-S2 Proprioception  and hot/cold testing deferred on this date   MUSCULOSKELETAL: Tremor: None Bulk: Normal Tone: Normal No visible step-off along spinal column  Posture Increased kyphosis Iliac crest height: R elevated R convexity of thoracic spine  Gait Increased L lateral flexion during gait and R Trendelenburg.   Palpation TTP at B PSIS, R periscapular area, R RTC insertions   Strength (out of 5) R/L 3+*/5 shoulder flexion 5/5 shoulder ER 5/5 shoulder IR 5/5 shoulder abduction 5/5 shoulder adduction 5/5 shoulder extension *Indicates pain   AROM (degrees) R/L Cervical forward flexion: WFL Cervical extension: [facility] Cervical lateral flexion: R: WFL  L: WFL* Lumbar flexion: WFL Lumbar extension:WFL (mild stretching of upper abdominals noted) Lumbar lateral flexion: R: 45cm* L: 49 cm Cervical rotation:  R: WFL L: WFL  Shoulder IR : R: L1/T12 L: T12/T11 Shoulder ER (0-45): R: WFL L: WFL Shoulder Flexion: R: 134 degrees L: 142 degrees Shoulder Abduction (0-40): R: WFL L: WFL Shoulder extension (0-15): R:WFL L:WFL *Indicates pain     Passive Accessory Intervertebral Motion (PAIVM) Patient denies reproduction of back pain with CPA T3-L5 and UPA bilaterally T3-L5. Generally hypomobile throughout   SPECIAL TESTS Empty Can: Positive for R Full Can: Positive for R External Rotation Test: R: Negative L: Negative Hawkins-Kennedy: Positive for R    ASSESSMENT Patient is a 69 year old presenting to clinic with chief complaints of R thoracic pain with increased UE activity. Upon examination, patient demonstrates deficits in posture, pain, RUE ROM, RUE strength, spinal ROM, and gait as evidenced by R convexity of thoracic spine, 7/10 worst pain, decreased R active shoulder flexion (134 degrees compared to L 142 degrees), RUE flexion 3+/5, L lumbar flexion 45 cm from floor vs. R lumbar flexion 49 cm from floor, and R trendelenburg during gait. Patient's responses on FOTO outcome measures (53)  indicate moderate functional limitations/disability/distress. Patient's progress may be limited due to persistence of condition and presence of comorbidities; however, patient's willingness to explore conservative treatment is advantageous. Patient was able to achieve good form with thoracic extension and scapular rotations during today's evaluation and responded positively to educational interventions. Patient will benefit from continued skilled therapeutic intervention to address deficits in posture, pain, RUE ROM, RUE strength, spinal ROM, and gait in order to increase function and improve overall QOL.   TREATMENT  Neuromuscular Re-education: Seated thoracic extension over towel roll for improved postural awareness and stimulation of bone growth Seated reverse scapular controlled articular rotations for improved postural awareness and scapular position  Patient educated on prognosis, POC, and provided with HEP including: thoracic extension, scapular rotations. Patient articulated understanding and returned demonstration. Patient will benefit from further education in order to maximize compliance and understanding for long-term therapeutic gains.     Objective measurements  completed on examination: See above findings.      PT Long Term Goals - 03/14/19 1907      PT LONG TERM GOAL #1   Title  Patient will be independent with HEP in order to improve strength and decrease back pain in order to improve pain-free function at home and in the community.    Baseline  IE: provided    Time  8    Period  Weeks    Status  New    Target Date  05/09/19      PT LONG TERM GOAL #2   Title  Patient will demonstrate improved function as evidenced by a score of 65 on FOTO measure for full participation in activities at home and in the community.    Baseline  IE: 53    Time  8    Period  Weeks    Status  New    Target Date  05/09/19      PT LONG TERM GOAL #3   Title  Patient will decrease worst  back pain as reported on NPRS by at least 2 points in order to demonstrate clinically significant reduction in back pain.    Baseline  IE: 7/10    Time  8    Period  Weeks    Status  New    Target Date  05/09/19      PT LONG TERM GOAL #4   Title  Patient will increase pain-free strength of R shoulder flexion by at least 1/2 MMT grade in order to demonstrate improvement in strength and function.    Baseline  IE: 3+/5    Time  8    Period  Weeks    Status  New    Target Date  05/09/19             Plan - 03/14/19 1849    Clinical Impression Statement  Patient is a 69 year old presenting to clinic with chief complaints of R thoracic pain with increased UE activity. Upon examination, patient demonstrates deficits in posture, pain, RUE ROM, RUE strength, spinal ROM, and gait as evidenced by R convexity of thoracic spine, 7/10 worst pain, decreased R active shoulder flexion (134 degrees compared to L 142 degrees), RUE flexion 3+/5, L lumbar flexion 45 cm from floor vs. R lumbar flexion 49 cm from floor, and R trendelenburg during gait. Patient's responses on FOTO outcome measures (53) indicate moderate functional limitations/disability/distress. Patient's progress may be limited due to persistence of condition and presence of comorbidities; however, patient's willingness to explore conservative treatment is advantageous. Patient was able to achieve good form with thoracic extension and scapular rotations during today's evaluation and responded positively to educational interventions. Patient will benefit from continued skilled therapeutic intervention to address deficits in posture, pain, RUE ROM, RUE strength, spinal ROM, and gait in order to increase function and improve overall QOL.    Personal Factors and Comorbidities  Age;Sex;Behavior Pattern;Comorbidity 3+;Past/Current Experience;Time since onset of injury/illness/exacerbation;Fitness;Profession    Comorbidities  diverticulitis, osteopenia,  HTN    Examination-Activity Limitations  Dressing;Caring for Others;Carry;Reach Overhead;Other    Examination-Participation Restrictions  Meal Prep;Driving;Shop;Laundry;Cleaning;Yard Work    [employer]  Evolving/Moderate complexity    Clinical Decision Making  Moderate    Rehab Potential  Fair    PT Frequency  1x / week    PT Duration  8 weeks    PT Treatment/Interventions  Electrical Stimulation;Cryotherapy;Moist Heat;ADLs/Self Care Home Management;Gait training;Stair training;Functional mobility training;Therapeutic activities;Cognitive remediation;Neuromuscular  re-education;Balance training;Therapeutic exercise;Patient/family education;Scar mobilization;Manual techniques;Dry needling;Passive range of motion;Taping;Joint Manipulations;Spinal Manipulations    PT Next Visit Plan  Postural re-education    PT Home Exercise Plan  Reverse scapular controlled articular rotations, thoracic extension    Consulted and Agree with Plan of Care  Patient       Patient will benefit from skilled therapeutic intervention in order to improve the following deficits and impairments:  Abnormal gait, Decreased balance, Decreased endurance, Decreased mobility, Pain, Postural dysfunction, Impaired UE functional use, Decreased strength, Decreased activity tolerance, Improper body mechanics, Decreased range of motion  Visit Diagnosis: Abnormal posture  Muscle weakness (generalized)  Pain in thoracic spine     Problem List Patient Active Problem List   Diagnosis Date Noted  . Hepatic abscess 01/18/2019  . Prediabetes 01/06/2018  . Obesity (BMI 35.0-39.9 without comorbidity) 01/06/2018  . CKD (chronic kidney disease) stage 2, GFR 60-89 ml/min 07/07/2017  . Osteopenia 04/20/2017  . Hip pain, chronic, left 03/18/2017  . Lower back pain 03/18/2017  . Sacroiliac joint pain 03/18/2017  . Screening for osteoporosis 12/30/2016  . Preventative health care 12/07/2016  . Encounter for  hepatitis C screening test for low risk patient 12/06/2016  . Essential hypertension, benign 03/08/2016  . Cervical pain (neck) 12/31/2014  . Lipoma of skin and subcutaneous tissue of neck 08/07/2014  . Reflux   . Depression   . Bursitis of left hip    Myles Gip PT, DPT [phone] 03/14/2019, 7:10 PM  [facility] [address] [place], [place], [address] Phone: [phone]   Fax:  [phone]  Name: PHOENICIA YBARRA MRN: [id number] Date of Birth: [birth year]

## 2019-03-19 ENCOUNTER — Ambulatory Visit (INDEPENDENT_AMBULATORY_CARE_PROVIDER_SITE_OTHER): Payer: Medicare Other | Admitting: Family Medicine

## 2019-03-19 ENCOUNTER — Encounter: Payer: Self-pay | Admitting: Family Medicine

## 2019-03-19 ENCOUNTER — Other Ambulatory Visit: Payer: Self-pay

## 2019-03-19 VITALS — BP 124/78 | HR 79 | Temp 98.7°F | Resp 14 | Ht 64.0 in | Wt 195.0 lb

## 2019-03-19 DIAGNOSIS — N182 Chronic kidney disease, stage 2 (mild): Secondary | ICD-10-CM | POA: Diagnosis not present

## 2019-03-19 DIAGNOSIS — M546 Pain in thoracic spine: Secondary | ICD-10-CM | POA: Diagnosis not present

## 2019-03-19 DIAGNOSIS — R7303 Prediabetes: Secondary | ICD-10-CM

## 2019-03-19 NOTE — Progress Notes (Signed)
Name: Allison Phillips   MRN: [id number]    DOB: May 19, 1950   Date:03/19/2019       Progress Note  Chief Complaint  Patient presents with  . Follow-up  . Back Pain    Seeing PT     Subjective:   Allison Phillips is a 69 y.o. female, presents to clinic for routine follow up on the conditions listed above.  6 month f/up from last routine visit with Suezanne Cheshire DNP - recheck of prediabetes, HTN, and CKD   She continues to have back pain, recently started PT, only one session so far, PT mentioned some possible scoliosis, she is doing weekly sessions, pain has become a little more constant, but rated 1/10 w/o any associated sx.  She is taking mobic PRN.    Hypertension:  Currently managed on diet and exercise, no current meds Only given lisinopril 5 mg a year or two ago for isolated high BP w/o dx of HTN, but she never took the medications Blood pressure today is well controlled. BP Readings from Last 10 Encounters:  03/19/19 124/78  02/08/19 (!) 152/85  02/07/19 (!) 152/74  02/01/19 118/70  01/23/19 (!) 173/79  01/17/19 118/82  01/08/19 134/75  12/18/18 128/72  11/17/18 122/82  11/08/18 138/79  Pt denies CP, SOB, exertional sx, LE edema, palpitation, Ha's, visual disturbances, lightheadedness, hypotension, syncope.  Prediabetes: Dx with prediabetes 6 months ago, but already improved when labs were recheck about 3-4 months ago Recent pertinent labs:   Lab Results  Component Value Date   HGBA1C 5.6 12/18/2018   HGBA1C 5.7 (H) 08/28/2018   HGBA1C 5.5 01/06/2018  she continues to try and eat healthy, would like to try and loose weight Typical diet Foods yogurt, baked chicken, cooks at home No polyuria, polydypsia, polyphagia, neuropathy  CKD - pt has dx of CKD stage 2, last labs were done through her port and of January, reviewed today with pt in room, Renal function excellent, GFR 101   Patient Active Problem List   Diagnosis Date Noted  . Hepatic abscess  01/18/2019  . Prediabetes 01/06/2018  . Obesity (BMI 35.0-39.9 without comorbidity) 01/06/2018  . CKD (chronic kidney disease) stage 2, GFR 60-89 ml/min 07/07/2017  . Osteopenia 04/20/2017  . Hip pain, chronic, left 03/18/2017  . Lower back pain 03/18/2017  . Sacroiliac joint pain 03/18/2017  . Screening for osteoporosis 12/30/2016  . Preventative health care 12/07/2016  . Encounter for hepatitis C screening test for low risk patient 12/06/2016  . Essential hypertension, benign 03/08/2016  . Cervical pain (neck) 12/31/2014  . Lipoma of skin and subcutaneous tissue of neck 08/07/2014  . Reflux   . Depression   . Bursitis of left hip     Past Surgical History:  Procedure Laterality Date  . ANAL FISSURE REPAIR    . APPENDECTOMY    . [place]  . COLONOSCOPY WITH PROPOFOL N/A 12/18/2014   Procedure: COLONOSCOPY WITH PROPOFOL;  Surgeon: Christene Lye, MD;  Location: [facility] ENDOSCOPY;  Service: Endoscopy;  Laterality: N/A;  . COLONOSCOPY WITH PROPOFOL N/A 02/17/2018   Procedure: COLONOSCOPY WITH PROPOFOL;  Surgeon: Virgel Manifold, MD;  Location: [facility] ENDOSCOPY;  Service: Endoscopy;  Laterality: N/A;  . LIPOMA EXCISION  07/2015   right shoulder  . LIPOMA EXCISION  11/19/2015  . OOPHORECTOMY     for "fibroids" not sure which ovary    Family History  Problem Relation Age of Onset  . Hypertension Mother   .  Kidney disease Mother   . Alzheimer's disease Mother   . Thyroid cancer Son   . Lung cancer Brother   . Rectal cancer Brother   . Breast cancer Neg Hx     Social History   Tobacco Use  . Smoking status: Current Every Day Smoker    Packs/day: 0.50    Years: 15.00    Pack years: 7.50    Types: Cigarettes    Last attempt to quit: 10/24/2016    Years since quitting: 2.4  . Smokeless tobacco: Never Used  Substance Use Topics  . Alcohol use: Yes    Alcohol/week: 4.0 standard drinks    Types: 4 Standard drinks or equivalent per week    Comment: on  occasion  . Drug use: No      Current Outpatient Medications:  .  meloxicam (MOBIC) 7.5 MG tablet, Take 7.5 mg by mouth daily., Disp: , Rfl:  .  Multiple Vitamin (MULTIVITAMIN) tablet, Take 1 tablet by mouth daily. Centrum Silver, Disp: , Rfl:  .  Omega-3 Fatty Acids (FISH OIL PO), Take 1,200 mg daily by mouth. , Disp: , Rfl:  .  Ampicillin-Sulbactam 3 g in sodium chloride 0.9 % 100 mL, Inject 3 g into the vein every 6 (six) hours. q6hrs until 02/15/19 (Patient not taking: Reported on 02/20/2019), Disp: 28 each, Rfl: 0 .  Cholecalciferol (VITAMIN D3) 20 MCG TABS, Take 25 mcg by mouth daily., Disp: , Rfl:  .  Cyanocobalamin (B-12) 500 MCG TABS, Take 2,500 mcg by mouth daily. , Disp: , Rfl:  .  fluticasone (FLONASE) 50 MCG/ACT nasal spray, Place 2 sprays into both nostrils daily. (Patient not taking: Reported on 03/14/2019), Disp: 16 g, Rfl: 6 .  naproxen (NAPROSYN) 500 MG tablet, TAKE 1 TABLET(500 MG) BY MOUTH TWICE DAILY AS NEEDED FOR MODERATE PAIN (Patient not taking: Reported on 02/20/2019), Disp: 30 tablet, Rfl: 0 .  ondansetron (ZOFRAN ODT) 4 MG disintegrating tablet, Take 1 tablet (4 mg total) by mouth every 8 (eight) hours as needed for nausea or vomiting. (Patient not taking: Reported on 02/08/2019), Disp: 20 tablet, Rfl: 0 .  polyethylene glycol (MIRALAX / GLYCOLAX) 17 g packet, Take 17 g by mouth at bedtime as needed for moderate constipation. (Patient not taking: Reported on 03/14/2019), Disp: 14 each, Rfl: 0 .  senna (SENOKOT) 8.6 MG TABS tablet, Take 1 tablet (8.6 mg total) by mouth at bedtime as needed for mild constipation. (Patient not taking: Reported on 02/08/2019), Disp: 120 tablet, Rfl: 0  No Known Allergies  Chart Review Today: I personally reviewed active problem list, medication list, allergies, family history, social history, health maintenance, notes from last encounter, lab results, imaging with the patient/caregiver today.  Review of Systems  10 Systems reviewed and are  negative for acute change except as noted in the HPI.   Objective:    Vitals:   03/19/19 1039  BP: 124/78  Pulse: 79  Resp: 14  Temp: 98.7 F (37.1 C)  SpO2: 98%  Weight: 195 lb (88.5 kg)  Height: 5\' 4"  (1.626 m)    Body mass index is 33.47 kg/m.  Physical Exam Vitals and nursing note reviewed.  Constitutional:      General: She is not in acute distress.    Appearance: Normal appearance. She is well-developed. She is obese. She is not ill-appearing, toxic-appearing or diaphoretic.     Interventions: Face mask in place.  HENT:     Head: Normocephalic and atraumatic.     Right  Ear: External ear normal.     Left Ear: External ear normal.  Eyes:     General: Lids are normal. No scleral icterus.       Right eye: No discharge.        Left eye: No discharge.     Conjunctiva/sclera: Conjunctivae normal.  Neck:     Trachea: Phonation normal. No tracheal deviation.  Cardiovascular:     Rate and Rhythm: Normal rate and regular rhythm.     Pulses: Normal pulses.          Radial pulses are 2+ on the right side and 2+ on the left side.       Posterior tibial pulses are 2+ on the right side and 2+ on the left side.     Heart sounds: Normal heart sounds. No murmur. No friction rub. No gallop.   Pulmonary:     Effort: Pulmonary effort is normal. No respiratory distress.     Breath sounds: Normal breath sounds. No stridor. No wheezing, rhonchi or rales.  Chest:     Chest wall: No tenderness.  Abdominal:     General: Bowel sounds are normal. There is no distension.     Palpations: Abdomen is soft.     Tenderness: There is no abdominal tenderness.  Musculoskeletal:        General: Tenderness (mild ttp to right thoracic paraspinal muscles) present. No deformity. Normal range of motion.     Cervical back: Normal range of motion and neck supple.     Right lower leg: No edema.     Left lower leg: No edema.  Lymphadenopathy:     Cervical: No cervical adenopathy.  Skin:    General:  Skin is warm and dry.     Capillary Refill: Capillary refill takes less than 2 seconds.     Coloration: Skin is not jaundiced or pale.     Findings: No rash.  Neurological:     Mental Status: She is alert and oriented to person, place, and time.     Motor: No abnormal muscle tone.     Gait: Gait normal.  Psychiatric:        Mood and Affect: Mood normal.        Speech: Speech normal.        Behavior: Behavior normal.      PHQ2/9: Depression screen [facility] 2/9 03/19/2019 02/20/2019 02/01/2019 01/17/2019 12/18/2018  Decreased Interest 0 0 0 1 0  Down, Depressed, Hopeless 0 1 1 1  0  PHQ - 2 Score 0 1 1 2  0  Altered sleeping 0 0 0 1 0  Tired, decreased energy 0 1 3 1  0  Change in appetite 0 0 0 2 0  Feeling bad or failure about yourself  1 0 0 1 0  Trouble concentrating 0 0 0 0 0  Moving slowly or fidgety/restless 0 0 0 0 0  Suicidal thoughts 0 0 0 0 0  PHQ-9 Score 1 2 4 7  0  Difficult doing work/chores Not difficult at all Not difficult at all Somewhat difficult Somewhat difficult Not difficult at all  Some recent data might be hidden    phq 9 is neg, reviewed today  Fall Risk: Fall Risk  03/19/2019 02/20/2019 02/01/2019 01/17/2019 12/18/2018  Falls in the past year? 0 0 0 0 0  Number falls in past yr: 0 0 0 0 0  Injury with Fall? 0 0 0 0 0  Follow up - - - - -  Functional Status Survey: Is the patient deaf or have difficulty hearing?: No Does the patient have difficulty seeing, even when wearing glasses/contacts?: No Does the patient have difficulty concentrating, remembering, or making decisions?: No Does the patient have difficulty walking or climbing stairs?: No Does the patient have difficulty dressing or bathing?: No Does the patient have difficulty doing errands alone such as visiting a doctor's office or shopping?: No   Assessment & Plan:     ICD-10-CM   1. Prediabetes  [id number] COMPLETE METABOLIC PANEL WITH GFR    Hemoglobin A1c   improved, last A1C 5.6 11/2018, continue  diet and lifestyle efforts, will recheck in 6 months  2. CKD (chronic kidney disease) stage 2, GFR 60-89 ml/min  [id number] COMPLETE METABOLIC PANEL WITH GFR   last labs show GFR 101, no meds, excellent renal function, stable  3. Acute right-sided thoracic back pain  M54.6    doing PT in [place] - tolerating, pain 1/10 tx with mobic   Pt has stable dx - most dx she has already improved - likely no longer has CKD stage 2 or prediabetes - will defer labs to her next appt.  Encouraged her to f/up here if she needs anything for pain/muscle spasms etc while doing PT  Return for 6 month routine f/up prediabetes/CKD/HTN.   Delsa Grana, PA-C 03/19/19 10:55 AM

## 2019-03-21 ENCOUNTER — Ambulatory Visit: Payer: Medicare Other | Attending: Family Medicine | Admitting: Physical Therapy

## 2019-03-21 ENCOUNTER — Other Ambulatory Visit: Payer: Self-pay

## 2019-03-21 ENCOUNTER — Encounter: Payer: Self-pay | Admitting: Physical Therapy

## 2019-03-21 DIAGNOSIS — M6281 Muscle weakness (generalized): Secondary | ICD-10-CM

## 2019-03-21 DIAGNOSIS — M546 Pain in thoracic spine: Secondary | ICD-10-CM | POA: Diagnosis present

## 2019-03-21 DIAGNOSIS — R293 Abnormal posture: Secondary | ICD-10-CM | POA: Diagnosis present

## 2019-03-21 NOTE — Therapy (Signed)
[facility] [address] [place], [place], [address] Phone: [phone]   Fax:  [phone]  Physical Therapy Treatment  Patient Details  Name: Allison Phillips MRN: [id number] Date of Birth: 1950-06-27 Referring Provider (PT): Delsa Grana   Encounter Date: 03/21/2019  PT End of Session - 03/21/19 0859    Visit Number  2    Number of Visits  9    Date for PT Re-Evaluation  05/09/19    Authorization Type  IE 03/14/2019    PT Start Time  0900    PT Stop Time  0945    PT Time Calculation (min)  45 min    Activity Tolerance  Patient tolerated treatment well    Behavior During Therapy  [facility] for tasks assessed/performed       Past Medical History:  Diagnosis Date  . Bursitis of left hip   . Depression   . Diverticulitis   . Hypertension   . Joint pain   . Obesity   . Osteopenia 04/20/2017   April 2019; next DEXA April 2021  . Osteoporosis   . Prediabetes   . Reflux   . Tobacco use     Past Surgical History:  Procedure Laterality Date  . ANAL FISSURE REPAIR    . APPENDECTOMY    . [place]  . COLONOSCOPY WITH PROPOFOL N/A 12/18/2014   Procedure: COLONOSCOPY WITH PROPOFOL;  Surgeon: Christene Lye, MD;  Location: [facility] ENDOSCOPY;  Service: Endoscopy;  Laterality: N/A;  . COLONOSCOPY WITH PROPOFOL N/A 02/17/2018   Procedure: COLONOSCOPY WITH PROPOFOL;  Surgeon: Virgel Manifold, MD;  Location: [facility] ENDOSCOPY;  Service: Endoscopy;  Laterality: N/A;  . LIPOMA EXCISION  07/2015   right shoulder  . LIPOMA EXCISION  11/19/2015  . OOPHORECTOMY     for "fibroids" not sure which ovary    There were no vitals filed for this visit.  Subjective Assessment - 03/21/19 0900    Subjective  Patient notes that the evaluation woke up some of her pain; the day after the evaluation when she was working around the house her pain was up to a 7/10 at the end of the day. Patient notes that most days when she wakes up the  pain starts at a 1-2/10 and willincrease with activity.    Currently in Pain?  Yes    Pain Score  1     Pain Location  Back    Pain Orientation  Right;Medial;Upper    Pain Descriptors / Indicators  Sharp;Shooting       TREATMENT  Neuromuscular Re-education: Thoracic extension over ball, seated, at lower, mid, and upper thoracic for 5-10 reps each.  Seated Pilates present for improved postural awareness, GTB, difficulty sensing abdominal engagement Supine hooklying diaphragmatic breathing with VCs and TCs for downregulation of the nervous system and improved management of IAP Sidelying, thoracolumbar rotations (open-book) bilaterally with diaphragmatic breathing for improved diaphragmatic and rib cage excursion. VCs and TCs to prevent compensations. Supine hooklying, TrA activation with exhalation. VCs and TCs to decrease compensatory patterns and minimize aggravation of the lumbar paraspinals. Patient education on osteoporosis/penia body mechanics for ADLs and bed mobility.  Patient educated throughout session on appropriate technique and form using multi-modal cueing, HEP, and activity modification. Patient articulated understanding and returned demonstration.  Patient Response to interventions: Patient with 0/10 pain at end of session (1-2/10 with log roll and open book)  ASSESSMENT Patient presents to clinic with excellent motivation to  participate in therapy. Patient demonstrates deficits in posture, pain, RUE ROM, RUE strength, spinal ROM, and gait. Patient able to achieve awareness of TrA in supine hooklying during today's session and responded positively to postural interventions. Patient will benefit from continued skilled therapeutic intervention to address remaining deficits in posture, pain, RUE ROM, RUE strength, spinal ROM, and gait in order to preserve bone density, increase function, and improve overall QOL.     PT Education - 03/21/19 0951    Education Details  Access  Code: [email or web address]    Person(s) Educated  Patient          PT Long Term Goals - 03/14/19 1907      PT LONG TERM GOAL #1   Title  Patient will be independent with HEP in order to improve strength and decrease back pain in order to improve pain-free function at home and in the community.    Baseline  IE: provided    Time  8    Period  Weeks    Status  New    Target Date  05/09/19      PT LONG TERM GOAL #2   Title  Patient will demonstrate improved function as evidenced by a score of 65 on FOTO measure for full participation in activities at home and in the community.    Baseline  IE: 53    Time  8    Period  Weeks    Status  New    Target Date  05/09/19      PT LONG TERM GOAL #3   Title  Patient will decrease worst back pain as reported on NPRS by at least 2 points in order to demonstrate clinically significant reduction in back pain.    Baseline  IE: 7/10    Time  8    Period  Weeks    Status  New    Target Date  05/09/19      PT LONG TERM GOAL #4   Title  Patient will increase pain-free strength of R shoulder flexion by at least 1/2 MMT grade in order to demonstrate improvement in strength and function.    Baseline  IE: 3+/5    Time  8    Period  Weeks    Status  New    Target Date  05/09/19            Plan - 03/21/19 0859    Clinical Impression Statement  Patient presents to clinic with excellent motivation to participate in therapy. Patient demonstrates deficits in posture, pain, RUE ROM, RUE strength, spinal ROM, and gait. Patient able to achieve awareness of TrA in supine hooklying during today's session and responded positively to postural interventions. Patient will benefit from continued skilled therapeutic intervention to address remaining deficits in posture, pain, RUE ROM, RUE strength, spinal ROM, and gait in order to preserve bone density, increase function, and improve overall QOL.    Personal Factors and Comorbidities  Age;Sex;Behavior Pattern;Comorbidity  3+;Past/Current Experience;Time since onset of injury/illness/exacerbation;Fitness;Profession    Comorbidities  diverticulitis, osteopenia, HTN    Examination-Activity Limitations  Dressing;Caring for Others;Carry;Reach Overhead;Other    Examination-Participation Restrictions  Meal Prep;Driving;Shop;Laundry;Cleaning;Yard Work    [employer]  Evolving/Moderate complexity    Rehab Potential  Fair    PT Frequency  1x / week    PT Duration  8 weeks    PT Treatment/Interventions  Electrical Stimulation;Cryotherapy;Moist Heat;ADLs/Self Care Home Management;Gait training;Stair training;Functional mobility training;Therapeutic activities;Cognitive remediation;Neuromuscular re-education;Balance training;Therapeutic exercise;Patient/family education;Scar mobilization;Manual  techniques;Dry needling;Passive range of motion;Taping;Joint Manipulations;Spinal Manipulations    PT Next Visit Plan  Postural re-education    PT Home Exercise Plan  Reverse scapular controlled articular rotations, thoracic extension    Consulted and Agree with Plan of Care  Patient       Patient will benefit from skilled therapeutic intervention in order to improve the following deficits and impairments:  Abnormal gait, Decreased balance, Decreased endurance, Decreased mobility, Pain, Postural dysfunction, Impaired UE functional use, Decreased strength, Decreased activity tolerance, Improper body mechanics, Decreased range of motion  Visit Diagnosis: Abnormal posture  Muscle weakness (generalized)  Pain in thoracic spine     Problem List Patient Active Problem List   Diagnosis Date Noted  . Hepatic abscess 01/18/2019  . Prediabetes 01/06/2018  . Obesity (BMI 35.0-39.9 without comorbidity) 01/06/2018  . CKD (chronic kidney disease) stage 2, GFR 60-89 ml/min 07/07/2017  . Osteopenia 04/20/2017  . Hip pain, chronic, left 03/18/2017  . Lower back pain 03/18/2017  . Sacroiliac joint pain 03/18/2017   . Screening for osteoporosis 12/30/2016  . Preventative health care 12/07/2016  . Encounter for hepatitis C screening test for low risk patient 12/06/2016  . Essential hypertension, benign 03/08/2016  . Cervical pain (neck) 12/31/2014  . Lipoma of skin and subcutaneous tissue of neck 08/07/2014  . Reflux   . Depression   . Bursitis of left hip    Myles Gip PT, DPT [phone] 03/21/2019, 9:57 AM  [facility] [address] [place], [place], [address] Phone: [phone]   Fax:  [phone]  Name: Allison Phillips MRN: [id number] Date of Birth: 1950/04/30

## 2019-03-27 ENCOUNTER — Encounter: Payer: Self-pay | Admitting: Family Medicine

## 2019-03-27 DIAGNOSIS — R739 Hyperglycemia, unspecified: Secondary | ICD-10-CM | POA: Insufficient documentation

## 2019-03-28 ENCOUNTER — Encounter: Payer: Medicare Other | Admitting: Physical Therapy

## 2019-03-29 ENCOUNTER — Other Ambulatory Visit: Payer: Self-pay

## 2019-03-29 ENCOUNTER — Encounter: Payer: Self-pay | Admitting: Physical Therapy

## 2019-03-29 ENCOUNTER — Ambulatory Visit: Payer: Medicare Other | Admitting: Physical Therapy

## 2019-03-29 DIAGNOSIS — M6281 Muscle weakness (generalized): Secondary | ICD-10-CM

## 2019-03-29 DIAGNOSIS — R293 Abnormal posture: Secondary | ICD-10-CM

## 2019-03-29 DIAGNOSIS — M546 Pain in thoracic spine: Secondary | ICD-10-CM

## 2019-03-29 NOTE — Therapy (Signed)
[facility] [address] [place], [place], [address] Phone: [phone]   Fax:  [phone]  Physical Therapy Treatment  Patient Details  Name: Allison Phillips MRN: [id number] Date of Birth: [birth year] Referring Provider (PT): Delsa Grana   Encounter Date: 03/29/2019  PT End of Session - 03/29/19 0909    Visit Number  3    Number of Visits  9    Date for PT Re-Evaluation  05/09/19    Authorization Type  IE 03/14/2019    PT Start Time  0900    PT Stop Time  0945    PT Time Calculation (min)  45 min    Activity Tolerance  Patient tolerated treatment well    Behavior During Therapy  [facility] for tasks assessed/performed       Past Medical History:  Diagnosis Date  . Bursitis of left hip   . Depression   . Diverticulitis   . Hypertension   . Joint pain   . Obesity   . Osteopenia 04/20/2017   April 2019; next DEXA April 2021  . Osteoporosis   . Prediabetes   . Reflux   . Tobacco use     Past Surgical History:  Procedure Laterality Date  . ANAL FISSURE REPAIR    . APPENDECTOMY    . [place]  . COLONOSCOPY WITH PROPOFOL N/A 12/18/2014   Procedure: COLONOSCOPY WITH PROPOFOL;  Surgeon: Christene Lye, MD;  Location: [facility] ENDOSCOPY;  Service: Endoscopy;  Laterality: N/A;  . COLONOSCOPY WITH PROPOFOL N/A 02/17/2018   Procedure: COLONOSCOPY WITH PROPOFOL;  Surgeon: Virgel Manifold, MD;  Location: [facility] ENDOSCOPY;  Service: Endoscopy;  Laterality: N/A;  . LIPOMA EXCISION  07/2015   right shoulder  . LIPOMA EXCISION  11/19/2015  . OOPHORECTOMY     for "fibroids" not sure which ovary    There were no vitals filed for this visit.  Subjective Assessment - 03/29/19 0900    Subjective  Patient notes that she is feeling better and has made some changes to her postures with ADLs and feels it has made a difference. She notes she continues to do her exercises which she finds helpful. She reports she is starting  to notice when she has done an increased amount of activity/work that she feels more fatigued in her back and shoulder with tasks like folding laundry. Worst pain in the past week was 6/10. Patient needs to leave today's appointment early in order to make it to another appointment.    Currently in Pain?  No/denies       TREATMENT  Neuromuscular Re-education: Body mechanics education and practice for improved safety/bone density and decreased pain including:  Hip hinge with dowel in sitting  Hip hinge with dowel in standing  Hip hinge for lifting weighted objects from a lower surface (2.5-5#)  Patient educated throughout session on appropriate technique and form using multi-modal cueing, HEP, and activity modification. Patient articulated understanding and returned demonstration.  Patient Response to interventions: Patient with 0/10 pain at end of session.  ASSESSMENT Patient presents to clinic with excellent motivation to participate in therapy. Patient demonstrates deficits in posture, pain, RUE ROM, RUE strength, spinal ROM, and gait. Patient able to maintain neutral posture with functional standing activities during today's session and responded positively to postural interventions. Patient will benefit from continued skilled therapeutic intervention to address remaining deficits in posture, pain, RUE ROM, RUE strength, spinal ROM, and gait in order to preserve  bone density, increase function, and improve overall QOL.    PT Long Term Goals - 03/14/19 1907      PT LONG TERM GOAL #1   Title  Patient will be independent with HEP in order to improve strength and decrease back pain in order to improve pain-free function at home and in the community.    Baseline  IE: provided    Time  8    Period  Weeks    Status  New    Target Date  05/09/19      PT LONG TERM GOAL #2   Title  Patient will demonstrate improved function as evidenced by a score of 65 on FOTO measure for full  participation in activities at home and in the community.    Baseline  IE: 53    Time  8    Period  Weeks    Status  New    Target Date  05/09/19      PT LONG TERM GOAL #3   Title  Patient will decrease worst back pain as reported on NPRS by at least 2 points in order to demonstrate clinically significant reduction in back pain.    Baseline  IE: 7/10    Time  8    Period  Weeks    Status  New    Target Date  05/09/19      PT LONG TERM GOAL #4   Title  Patient will increase pain-free strength of R shoulder flexion by at least 1/2 MMT grade in order to demonstrate improvement in strength and function.    Baseline  IE: 3+/5    Time  8    Period  Weeks    Status  New    Target Date  05/09/19            Plan - 03/29/19 0909    Clinical Impression Statement  Patient presents to clinic with excellent motivation to participate in therapy. Patient demonstrates deficits in posture, pain, RUE ROM, RUE strength, spinal ROM, and gait. Patient able to maintain neutral posture with functional standing activities during today's session and responded positively to postural interventions. Patient will benefit from continued skilled therapeutic intervention to address remaining deficits in posture, pain, RUE ROM, RUE strength, spinal ROM, and gait in order to preserve bone density, increase function, and improve overall QOL.    Personal Factors and Comorbidities  Age;Sex;Behavior Pattern;Comorbidity 3+;Past/Current Experience;Time since onset of injury/illness/exacerbation;Fitness;Profession    Comorbidities  diverticulitis, osteopenia, HTN    Examination-Activity Limitations  Dressing;Caring for Others;Carry;Reach Overhead;Other    Examination-Participation Restrictions  Meal Prep;Driving;Shop;Laundry;Cleaning;Yard Work    [employer] officer  Evolving/Moderate complexity    Rehab Potential  Fair    PT Frequency  1x / week    PT Duration  8 weeks    PT Treatment/Interventions   Electrical Stimulation;Cryotherapy;Moist Heat;ADLs/Self Care Home Management;Gait training;Stair training;Functional mobility training;Therapeutic activities;Cognitive remediation;Neuromuscular re-education;Balance training;Therapeutic exercise;Patient/family education;Scar mobilization;Manual techniques;Dry needling;Passive range of motion;Taping;Joint Manipulations;Spinal Manipulations    PT Next Visit Plan  Postural re-education    PT Home Exercise Plan  Reverse scapular controlled articular rotations, thoracic extension    Consulted and Agree with Plan of Care  Patient       Patient will benefit from skilled therapeutic intervention in order to improve the following deficits and impairments:  Abnormal gait, Decreased balance, Decreased endurance, Decreased mobility, Pain, Postural dysfunction, Impaired UE functional use, Decreased strength, Decreased activity tolerance, Improper body mechanics, Decreased range of motion  Visit Diagnosis: Abnormal posture  Muscle weakness (generalized)  Pain in thoracic spine     Problem List Patient Active Problem List   Diagnosis Date Noted  . Hyperglycemia 03/27/2019  . Hepatic abscess 01/18/2019  . Prediabetes 01/06/2018  . Obesity (BMI 35.0-39.9 without comorbidity) 01/06/2018  . CKD (chronic kidney disease) stage 2, GFR 60-89 ml/min 07/07/2017  . Osteopenia 04/20/2017  . Hip pain, chronic, left 03/18/2017  . Lower back pain 03/18/2017  . Sacroiliac joint pain 03/18/2017  . Screening for osteoporosis 12/30/2016  . Preventative health care 12/07/2016  . Encounter for hepatitis C screening test for low risk patient 12/06/2016  . Essential hypertension, benign 03/08/2016  . Cervical pain (neck) 12/31/2014  . Lipoma of skin and subcutaneous tissue of neck 08/07/2014  . Reflux   . Depression   . Bursitis of left hip    Myles Gip PT, DPT [phone] 03/29/2019, 12:20 PM  [facility] [address] [place], [place], [address] Phone: [phone]   Fax:  [phone]  Name: Allison Phillips MRN: [id number] Date of Birth: 1950/03/08

## 2019-04-02 ENCOUNTER — Ambulatory Visit: Payer: Medicare Other | Attending: Internal Medicine

## 2019-04-02 ENCOUNTER — Ambulatory Visit: Payer: TRICARE For Life (TFL) | Admitting: Family Medicine

## 2019-04-02 DIAGNOSIS — Z23 Encounter for immunization: Secondary | ICD-10-CM

## 2019-04-02 NOTE — Progress Notes (Signed)
   Covid-19 Vaccination Clinic  Name:  Allison Phillips    MRN: [id number] DOB: 22-Feb-1950  04/02/2019  Allison Phillips was observed post Covid-19 immunization for 15 minutes without incident. She was provided with Vaccine Information Sheet and instruction to access the V-Safe system.   Allison Phillips was instructed to call 911 with any severe reactions post vaccine: Marland Kitchen Difficulty breathing  . Swelling of face and throat  . A fast heartbeat  . A bad rash all over body  . Dizziness and weakness   Immunizations Administered    Name Date Dose VIS Date Route   Moderna COVID-19 Vaccine 04/02/2019 10:54 AM 0.5 mL 12/19/2018 Intramuscular   Manufacturer: Moderna   Lot: [id number]   [id number]

## 2019-04-04 ENCOUNTER — Encounter: Payer: Medicare Other | Admitting: Physical Therapy

## 2019-04-11 ENCOUNTER — Ambulatory Visit: Payer: Medicare Other | Admitting: Physical Therapy

## 2019-04-11 ENCOUNTER — Other Ambulatory Visit: Payer: Self-pay

## 2019-04-11 DIAGNOSIS — M6281 Muscle weakness (generalized): Secondary | ICD-10-CM

## 2019-04-11 DIAGNOSIS — M546 Pain in thoracic spine: Secondary | ICD-10-CM

## 2019-04-11 DIAGNOSIS — R293 Abnormal posture: Secondary | ICD-10-CM | POA: Diagnosis not present

## 2019-04-11 NOTE — Therapy (Signed)
[facility] [address] [place], [place], [address] Phone: [phone]   Fax:  [phone]  Physical Therapy Treatment  Patient Details  Name: Allison Phillips MRN: [id number] Date of Birth: Aug 26, 1950 Referring Provider (PT): Delsa Grana   Encounter Date: 04/11/2019  PT End of Session - 04/11/19 0904    Visit Number  4    Number of Visits  9    Date for PT Re-Evaluation  05/09/19    Authorization Type  IE 03/14/2019    PT Start Time  0902    PT Stop Time  0955    PT Time Calculation (min)  53 min    Activity Tolerance  Patient tolerated treatment well    Behavior During Therapy  [facility] for tasks assessed/performed       Past Medical History:  Diagnosis Date  . Bursitis of left hip   . Depression   . Diverticulitis   . Hypertension   . Joint pain   . Obesity   . Osteopenia 04/20/2017   April 2019; next DEXA April 2021  . Osteoporosis   . Prediabetes   . Reflux   . Tobacco use     Past Surgical History:  Procedure Laterality Date  . ANAL FISSURE REPAIR    . APPENDECTOMY    . [place]  . COLONOSCOPY WITH PROPOFOL N/A 12/18/2014   Procedure: COLONOSCOPY WITH PROPOFOL;  Surgeon: Christene Lye, MD;  Location: [facility] ENDOSCOPY;  Service: Endoscopy;  Laterality: N/A;  . COLONOSCOPY WITH PROPOFOL N/A 02/17/2018   Procedure: COLONOSCOPY WITH PROPOFOL;  Surgeon: Virgel Manifold, MD;  Location: [facility] ENDOSCOPY;  Service: Endoscopy;  Laterality: N/A;  . LIPOMA EXCISION  07/2015   right shoulder  . LIPOMA EXCISION  11/19/2015  . OOPHORECTOMY     for "fibroids" not sure which ovary    There were no vitals filed for this visit.  Subjective Assessment - 04/11/19 0906    Subjective  Patient reports that she continues to feel better. She reports that she has been able to do a bit more and only is experiencing pain occasionally. Worst pain since last session was 3/10 when putting bowls on a high shelf.  Patient notes that the pain did not last.    Currently in Pain?  No/denies       TREATMENT  Manual Therapy: STM and TPR performed to R periscapular region and UT to allow for decreased DOMS/pain and improved posture and function using vibratory/percussive instrument  Neuromuscular Re-education: UBE, fwd/rvs, 2x1 min each, for graded exposure to movement and increased activation of thoracic postural muscles Scapular and postural strengthening/motor control activities, YTB, reps to fatigue (15-20/exercise)  Scapular retraction with ER  PNF GH D1 flexion  PNF GH D2 flexion  Horizontal abduction Latissimus dorsi stretch with physioball for improved pain and decreased DOMS     Patient educated throughout session on appropriate technique and form using multi-modal cueing, HEP, and activity modification. Patient articulated understanding and returned demonstration.  Patient Response to interventions: Patient with 0/10 pain at end of session.  ASSESSMENT Patient presents to clinic with excellent motivation to participate in therapy. Patient demonstrates deficits in posture, pain, RUE ROM, RUE strength, spinal ROM, and gait. Patient able to maintain appropriate form with all activities during today's session and responded positively to postural interventions. Patient also demonstrated an 8 point improvement in function as evidenced by FOTO score of 61 (target 65). Patient will benefit from continued  skilled therapeutic intervention to address remaining deficits in posture, pain, RUE ROM, RUE strength, spinal ROM, and gait in order to preserve bone density, increase function, and improve overall QOL.    PT Long Term Goals - 03/14/19 1907      PT LONG TERM GOAL #1   Title  Patient will be independent with HEP in order to improve strength and decrease back pain in order to improve pain-free function at home and in the community.    Baseline  IE: provided    Time  8    Period  Weeks    Status   New    Target Date  05/09/19      PT LONG TERM GOAL #2   Title  Patient will demonstrate improved function as evidenced by a score of 65 on FOTO measure for full participation in activities at home and in the community.    Baseline  IE: 53    Time  8    Period  Weeks    Status  New    Target Date  05/09/19      PT LONG TERM GOAL #3   Title  Patient will decrease worst back pain as reported on NPRS by at least 2 points in order to demonstrate clinically significant reduction in back pain.    Baseline  IE: 7/10    Time  8    Period  Weeks    Status  New    Target Date  05/09/19      PT LONG TERM GOAL #4   Title  Patient will increase pain-free strength of R shoulder flexion by at least 1/2 MMT grade in order to demonstrate improvement in strength and function.    Baseline  IE: 3+/5    Time  8    Period  Weeks    Status  New    Target Date  05/09/19            Plan - 04/11/19 0913    Clinical Impression Statement  Patient presents to clinic with excellent motivation to participate in therapy. Patient demonstrates deficits in posture, pain, RUE ROM, RUE strength, spinal ROM, and gait. Patient able to maintain appropriate form with all activities during today's session and responded positively to postural interventions. Patient also demonstrated an 8 point improvement in function as evidenced by FOTO score of 61 (target 65). Patient will benefit from continued skilled therapeutic intervention to address remaining deficits in posture, pain, RUE ROM, RUE strength, spinal ROM, and gait in order to preserve bone density, increase function, and improve overall QOL.    Personal Factors and Comorbidities  Age;Sex;Behavior Pattern;Comorbidity 3+;Past/Current Experience;Time since onset of injury/illness/exacerbation;Fitness;Profession    Comorbidities  diverticulitis, osteopenia, HTN    Examination-Activity Limitations  Dressing;Caring for Others;Carry;Reach Overhead;Other     Examination-Participation Restrictions  Meal Prep;Driving;Shop;Laundry;Cleaning;Yard Work    [employer]  Evolving/Moderate complexity    Rehab Potential  Fair    PT Frequency  1x / week    PT Duration  8 weeks    PT Treatment/Interventions  Electrical Stimulation;Cryotherapy;Moist Heat;ADLs/Self Care Home Management;Gait training;Stair training;Functional mobility training;Therapeutic activities;Cognitive remediation;Neuromuscular re-education;Balance training;Therapeutic exercise;Patient/family education;Scar mobilization;Manual techniques;Dry needling;Passive range of motion;Taping;Joint Manipulations;Spinal Manipulations    PT Next Visit Plan  Postural re-education    PT Home Exercise Plan  Reverse scapular controlled articular rotations, thoracic extension    Consulted and Agree with Plan of Care  Patient       Patient will benefit from skilled  therapeutic intervention in order to improve the following deficits and impairments:  Abnormal gait, Decreased balance, Decreased endurance, Decreased mobility, Pain, Postural dysfunction, Impaired UE functional use, Decreased strength, Decreased activity tolerance, Improper body mechanics, Decreased range of motion  Visit Diagnosis: Abnormal posture  Muscle weakness (generalized)  Pain in thoracic spine     Problem List Patient Active Problem List   Diagnosis Date Noted  . Hyperglycemia 03/27/2019  . Hepatic abscess 01/18/2019  . Prediabetes 01/06/2018  . Obesity (BMI 35.0-39.9 without comorbidity) 01/06/2018  . CKD (chronic kidney disease) stage 2, GFR 60-89 ml/min 07/07/2017  . Osteopenia 04/20/2017  . Hip pain, chronic, left 03/18/2017  . Lower back pain 03/18/2017  . Sacroiliac joint pain 03/18/2017  . Screening for osteoporosis 12/30/2016  . Preventative health care 12/07/2016  . Encounter for hepatitis C screening test for low risk patient 12/06/2016  . Essential hypertension, benign 03/08/2016  .  Cervical pain (neck) 12/31/2014  . Lipoma of skin and subcutaneous tissue of neck 08/07/2014  . Reflux   . Depression   . Bursitis of left hip    Myles Gip PT, DPT [phone] 04/11/2019, 10:21 AM  [facility] [address] [place], [place], [address] Phone: [phone]   Fax:  [phone]  Name: Allison Phillips MRN: [id number] Date of Birth: 05-07-1950

## 2019-04-18 ENCOUNTER — Ambulatory Visit: Payer: Medicare Other | Admitting: Physical Therapy

## 2019-04-25 ENCOUNTER — Encounter: Payer: Self-pay | Admitting: Physical Therapy

## 2019-04-25 ENCOUNTER — Other Ambulatory Visit: Payer: Self-pay

## 2019-04-25 ENCOUNTER — Ambulatory Visit: Payer: Medicare Other | Attending: Family Medicine | Admitting: Physical Therapy

## 2019-04-25 DIAGNOSIS — R293 Abnormal posture: Secondary | ICD-10-CM | POA: Diagnosis present

## 2019-04-25 DIAGNOSIS — M546 Pain in thoracic spine: Secondary | ICD-10-CM | POA: Diagnosis present

## 2019-04-25 DIAGNOSIS — M6281 Muscle weakness (generalized): Secondary | ICD-10-CM | POA: Diagnosis present

## 2019-04-25 NOTE — Therapy (Signed)
[place] [facility] [address] [place], [place], [address] Phone: [phone]   Fax:  [phone]  Physical Therapy Treatment  Patient Details  Name: SHERRYLL DESA MRN: [id number] Date of Birth: 1950/03/25 Referring Provider (PT): Delsa Grana   Encounter Date: 04/25/2019  PT End of Session - 04/25/19 0909    Visit Number  5    Number of Visits  9    Date for PT Re-Evaluation  05/09/19    Authorization Type  IE 03/14/2019    PT Start Time  0900    PT Stop Time  0938    PT Time Calculation (min)  38 min    Activity Tolerance  Patient tolerated treatment well    Behavior During Therapy  [facility] for tasks assessed/performed       Past Medical History:  Diagnosis Date  . Bursitis of left hip   . Depression   . Diverticulitis   . Hypertension   . Joint pain   . Obesity   . Osteopenia 04/20/2017   April 2019; next DEXA April 2021  . Osteoporosis   . Prediabetes   . Reflux   . Tobacco use     Past Surgical History:  Procedure Laterality Date  . ANAL FISSURE REPAIR    . APPENDECTOMY    . [place]  . COLONOSCOPY WITH PROPOFOL N/A 12/18/2014   Procedure: COLONOSCOPY WITH PROPOFOL;  Surgeon: Christene Lye, MD;  Location: [facility] ENDOSCOPY;  Service: Endoscopy;  Laterality: N/A;  . COLONOSCOPY WITH PROPOFOL N/A 02/17/2018   Procedure: COLONOSCOPY WITH PROPOFOL;  Surgeon: Virgel Manifold, MD;  Location: [facility] ENDOSCOPY;  Service: Endoscopy;  Laterality: N/A;  . LIPOMA EXCISION  07/2015   right shoulder  . LIPOMA EXCISION  11/19/2015  . OOPHORECTOMY     for "fibroids" not sure which ovary    There were no vitals filed for this visit.  Subjective Assessment - 04/25/19 0908    Subjective  Patient notes that she feels good. She is able to do more, but is still respecting when her body tells her to take a break. She did get some aggravating pain from peeling a few pounds of potatoes.    Currently in Pain?   No/denies      TREATMENT  Neuromuscular Re-education: UBE, rvs, 5 min, for graded exposure to movement and increased activation of thoracic postural muscles Thoracic extension over ball, x20 with and without rotation/lateral flexion combination Scapular and postural strengthening/motor control activities, YTB, reps to fatigue (15-20/exercise)  Scapular retraction with ER  PNF GH D1 flexion  PNF GH D2 flexion  Horizontal abduction Latissimus dorsi stretch with countersupport for improved pain and decreased DOMS     Patient educated throughout session on appropriate technique and form using multi-modal cueing, HEP, and activity modification. Patient articulated understanding and returned demonstration.  Patient Response to interventions: Patient with 0/10 pain at end of session.  ASSESSMENT Patient presents to clinic with excellent motivation to participate in therapy. Patient demonstrates minimal remaining deficits in posture, pain, spinal ROM. Patient has achieved/surpassed all goals at this time. Patient is appropriate for trial self-management bu may benefit from continued skilled therapeutic intervention to address remaining deficits in posture, pain, RUE ROM, RUE strength, spinal ROM, and gait in order to preserve bone density, increase function, and improve overall QOL.     PT Long Term Goals - 04/25/19 0923      PT LONG TERM GOAL #1  Title  Patient will be independent with HEP in order to improve strength and decrease back pain in order to improve pain-free function at home and in the community.    Baseline  IE: provided; 4/7: IND    Time  8    Period  Weeks    Status  Achieved      PT LONG TERM GOAL #2   Title  Patient will demonstrate improved function as evidenced by a score of 65 on FOTO measure for full participation in activities at home and in the community.    Baseline  IE: 53 4/7: 72    Time  8    Period  Weeks    Status  Achieved      PT LONG TERM GOAL #3    Title  Patient will decrease worst back pain as reported on NPRS by at least 2 points in order to demonstrate clinically significant reduction in back pain.    Baseline  IE: 7/10; 4/7: 5/10    Time  8    Period  Weeks    Status  Achieved      PT LONG TERM GOAL #4   Title  Patient will increase pain-free strength of R shoulder flexion by at least 1/2 MMT grade in order to demonstrate improvement in strength and function.    Baseline  IE: 3+/5; 4/7: 5/5    Time  8    Period  Weeks    Status  Achieved            Plan - 04/25/19 0910    Clinical Impression Statement  Patient presents to clinic with excellent motivation to participate in therapy. Patient demonstrates minimal remaining deficits in posture, pain, spinal ROM. Patient has achieved/surpassed all goals at this time. Patient is appropriate for trial self-management bu may benefit from continued skilled therapeutic intervention to address remaining deficits in posture, pain, RUE ROM, RUE strength, spinal ROM, and gait in order to preserve bone density, increase function, and improve overall QOL.    Personal Factors and Comorbidities  Age;Sex;Behavior Pattern;Comorbidity 3+;Past/Current Experience;Time since onset of injury/illness/exacerbation;Fitness;Profession    Comorbidities  diverticulitis, osteopenia, HTN    Examination-Activity Limitations  Dressing;Caring for Others;Carry;Reach Overhead;Other    Examination-Participation Restrictions  Meal Prep;Driving;Shop;Laundry;Cleaning;Yard Work    [employer] officer  Evolving/Moderate complexity    Rehab Potential  Fair    PT Frequency  1x / week    PT Duration  8 weeks    PT Treatment/Interventions  Electrical Stimulation;Cryotherapy;Moist Heat;ADLs/Self Care Home Management;Gait training;Stair training;Functional mobility training;Therapeutic activities;Cognitive remediation;Neuromuscular re-education;Balance training;Therapeutic exercise;Patient/family  education;Scar mobilization;Manual techniques;Dry needling;Passive range of motion;Taping;Joint Manipulations;Spinal Manipulations    PT Next Visit Plan  Postural re-education    PT Home Exercise Plan  Reverse scapular controlled articular rotations, thoracic extension    Consulted and Agree with Plan of Care  Patient       Patient will benefit from skilled therapeutic intervention in order to improve the following deficits and impairments:  Abnormal gait, Decreased balance, Decreased endurance, Decreased mobility, Pain, Postural dysfunction, Impaired UE functional use, Decreased strength, Decreased activity tolerance, Improper body mechanics, Decreased range of motion  Visit Diagnosis: Abnormal posture  Muscle weakness (generalized)  Pain in thoracic spine     Problem List Patient Active Problem List   Diagnosis Date Noted  . Hyperglycemia 03/27/2019  . Hepatic abscess 01/18/2019  . Prediabetes 01/06/2018  . Obesity (BMI 35.0-39.9 without comorbidity) 01/06/2018  . CKD (chronic kidney disease) stage 2, GFR  60-89 ml/min 07/07/2017  . Osteopenia 04/20/2017  . Hip pain, chronic, left 03/18/2017  . Lower back pain 03/18/2017  . Sacroiliac joint pain 03/18/2017  . Screening for osteoporosis 12/30/2016  . Preventative health care 12/07/2016  . Encounter for hepatitis C screening test for low risk patient 12/06/2016  . Essential hypertension, benign 03/08/2016  . Cervical pain (neck) 12/31/2014  . Lipoma of skin and subcutaneous tissue of neck 08/07/2014  . Reflux   . Depression   . Bursitis of left hip    Myles Gip PT, DPT [phone] 04/25/2019, 9:51 AM  [facility] [address] [place], [place], [address] Phone: [phone]   Fax:  [phone]  Name: CATELEYA WALLEY MRN: [id number] Date of Birth: Feb 02, 1950

## 2019-05-02 ENCOUNTER — Encounter: Payer: Medicare Other | Admitting: Physical Therapy

## 2019-05-09 ENCOUNTER — Encounter: Payer: Self-pay | Admitting: Family Medicine

## 2019-05-09 ENCOUNTER — Ambulatory Visit (INDEPENDENT_AMBULATORY_CARE_PROVIDER_SITE_OTHER): Payer: Medicare Other | Admitting: Family Medicine

## 2019-05-09 ENCOUNTER — Other Ambulatory Visit: Payer: Self-pay

## 2019-05-09 VITALS — BP 142/84 | HR 68 | Temp 97.8°F | Resp 16 | Ht 64.0 in | Wt 201.5 lb

## 2019-05-09 DIAGNOSIS — M546 Pain in thoracic spine: Secondary | ICD-10-CM

## 2019-05-09 MED ORDER — DICLOFENAC SODIUM 1 % EX GEL: 2.0000 g | Freq: Four times a day (QID) | CUTANEOUS | 5 refills | Status: DC | PRN

## 2019-05-09 NOTE — Progress Notes (Signed)
Patient ID: Allison Phillips, female    DOB: [birth year], 69 y.o.   MRN: [id number]  PCP: Delsa Grana, PA-C  Chief Complaint  Patient presents with  . Back Pain    shoulder blade area   . Shoulder Pain    right shoulder    Subjective:   Allison Phillips is a 69 y.o. female, presents to clinic with CC of the following: Patient presents for recurrent complaint of right thoracic back pain between her right shoulder blade and her right thoracic midline spine.  She had completed physical therapy and was doing very well she had increased range of motion she had adjusted the way that she reached for things and was doing her home exercises.  She felt that this was finally improving after presenting here to past PCP and to me for many many months.  She had sudden onset of pain that was sharp and burning the other day and this concerned her and she does want a referral to a specialist or she wants MRI imaging.  She did not do anything strenuous she has no radiation of pain to her arm or shoulders she continues to have no pain with deep inspiration.  The pain was very brief, lasting only a few seconds has been intermittent but very severe in nature.  Not reproducible with any movement or palpation.  Patient Active Problem List   Diagnosis Date Noted  . Hyperglycemia 03/27/2019  . Hepatic abscess 01/18/2019  . Prediabetes 01/06/2018  . Obesity (BMI 35.0-39.9 without comorbidity) 01/06/2018  . CKD (chronic kidney disease) stage 2, GFR 60-89 ml/min 07/07/2017  . Osteopenia 04/20/2017  . Hip pain, chronic, left 03/18/2017  . Lower back pain 03/18/2017  . Sacroiliac joint pain 03/18/2017  . Screening for osteoporosis 12/30/2016  . Preventative health care 12/07/2016  . Encounter for hepatitis C screening test for low risk patient 12/06/2016  . Essential hypertension, benign 03/08/2016  . Cervical pain (neck) 12/31/2014  . Lipoma of skin and subcutaneous tissue of neck 08/07/2014  . Reflux   .  Depression   . Bursitis of left hip       Current Outpatient Medications:  .  Cholecalciferol (VITAMIN D3) 20 MCG TABS, Take 25 mcg by mouth daily., Disp: , Rfl:  .  Cyanocobalamin (B-12) 500 MCG TABS, Take 2,500 mcg by mouth daily. , Disp: , Rfl:  .  meloxicam (MOBIC) 7.5 MG tablet, Take 7.5 mg by mouth daily., Disp: , Rfl:  .  Multiple Vitamin (MULTIVITAMIN) tablet, Take 1 tablet by mouth daily. Centrum Silver, Disp: , Rfl:  .  Omega-3 Fatty Acids (FISH OIL PO), Take 1,200 mg daily by mouth. , Disp: , Rfl:  .  Ampicillin-Sulbactam 3 g in sodium chloride 0.9 % 100 mL, Inject 3 g into the vein every 6 (six) hours. q6hrs until 02/15/19 (Patient not taking: Reported on 02/20/2019), Disp: 28 each, Rfl: 0 .  fluticasone (FLONASE) 50 MCG/ACT nasal spray, Place 2 sprays into both nostrils daily. (Patient not taking: Reported on 03/14/2019), Disp: 16 g, Rfl: 6 .  naproxen (NAPROSYN) 500 MG tablet, TAKE 1 TABLET(500 MG) BY MOUTH TWICE DAILY AS NEEDED FOR MODERATE PAIN (Patient not taking: Reported on 02/20/2019), Disp: 30 tablet, Rfl: 0 .  ondansetron (ZOFRAN ODT) 4 MG disintegrating tablet, Take 1 tablet (4 mg total) by mouth every 8 (eight) hours as needed for nausea or vomiting. (Patient not taking: Reported on 02/08/2019), Disp: 20 tablet, Rfl: 0 .  polyethylene glycol (MIRALAX /  GLYCOLAX) 17 g packet, Take 17 g by mouth at bedtime as needed for moderate constipation. (Patient not taking: Reported on 03/14/2019), Disp: 14 each, Rfl: 0 .  senna (SENOKOT) 8.6 MG TABS tablet, Take 1 tablet (8.6 mg total) by mouth at bedtime as needed for mild constipation. (Patient not taking: Reported on 02/08/2019), Disp: 120 tablet, Rfl: 0   Allergies  Allergen Reactions  . Adhesive [Tape]     Rash/dermatitis     Family History  Problem Relation Age of Onset  . Hypertension Mother   . Kidney disease Mother   . Alzheimer's disease Mother   . Thyroid cancer Son   . Lung cancer Brother   . Rectal cancer Brother     . Breast cancer Neg Hx      Social History   Socioeconomic History  . Marital status: Married    Spouse name: Nadara Mustard  . Number of children: 1  . Years of education: Not on file  . Highest education level: Bachelor's degree (e.g., BA, AB, BS)  Occupational History  . Occupation: Retired  Tobacco Use  . Smoking status: Current Every Day Smoker    Packs/day: 0.50    Years: 15.00    Pack years: 7.50    Types: Cigarettes    Last attempt to quit: 10/24/2016    Years since quitting: 2.5  . Smokeless tobacco: Never Used  Substance and Sexual Activity  . Alcohol use: Yes    Alcohol/week: 4.0 standard drinks    Types: 4 Standard drinks or equivalent per week    Comment: on occasion  . Drug use: No  . Sexual activity: Yes    Partners: Male  Other Topics Concern  . Not on file  Social History Narrative  . Not on file   Social Determinants of Health   Financial Resource Strain: Low Risk   . Difficulty of Paying Living Expenses: Not hard at all  Food Insecurity: No Food Insecurity  . Worried About Charity fundraiser in the Last Year: Never true  . Ran Out of Food in the Last Year: Never true  Transportation Needs: No Transportation Needs  . Lack of Transportation (Medical): No  . Lack of Transportation (Non-Medical): No  Physical Activity: Inactive  . Days of Exercise per Week: 0 days  . Minutes of Exercise per Session: 0 min  Stress: No Stress Concern Present  . Feeling of Stress : Only a little  Social Connections: Not Isolated  . Frequency of Communication with Friends and Family: More than three times a week  . Frequency of Social Gatherings with Friends and Family: Twice a week  . Attends Religious Services: More than 4 times per year  . Active Member of Clubs or Organizations: Yes  . Attends Archivist Meetings: More than 4 times per year  . Marital Status: Married  Human resources officer Violence: Not At Risk  . Fear of Current or Ex-Partner: No  .  Emotionally Abused: No  . Physically Abused: No  . Sexually Abused: No    Chart Review Today: I personally reviewed active problem list, medication list, allergies, family history, social history, health maintenance, notes from last encounter, lab results, imaging with the patient/caregiver today.   Review of Systems 10 Systems reviewed and are negative for acute change except as noted in the HPI.     Objective:   Vitals:   05/09/19 1134  BP: (!) 142/84  Pulse: 68  Resp: 16  Temp: 97.8 F (36.6  C)  TempSrc: Temporal  SpO2: 99%  Weight: 201 lb 8 oz (91.4 kg)  Height: 5\' 4"  (1.626 m)    Body mass index is 34.59 kg/m.  Physical Exam Vitals and nursing note reviewed.  Constitutional:      Appearance: She is well-developed.  HENT:     Head: Normocephalic and atraumatic.     Nose: Nose normal.  Eyes:     General:        Right eye: No discharge.        Left eye: No discharge.     Conjunctiva/sclera: Conjunctivae normal.  Neck:     Trachea: No tracheal deviation.  Cardiovascular:     Rate and Rhythm: Normal rate and regular rhythm.  Pulmonary:     Effort: Pulmonary effort is normal. No respiratory distress.     Breath sounds: No stridor.  Musculoskeletal:        General: Normal range of motion.     Comments: Continued mild tenderness to palpation from mid thoracic right paraspinal area to right scapula No midline tenderness from cervical to lumbar spine Grossly normal range of motion of right shoulder Normal sensation to right upper extremity in all nerve distributions, normal strength 5/5 strength  Skin:    General: Skin is warm and dry.     Findings: No rash.  Neurological:     Mental Status: She is alert.     Motor: No abnormal muscle tone.     Coordination: Coordination normal.  Psychiatric:        Behavior: Behavior normal.      Results for orders placed or performed during the hospital encounter of 02/06/19  Comprehensive metabolic panel  Result  Value Ref Range   Sodium 139 135 - 145 mmol/L   Potassium 4.0 3.5 - 5.1 mmol/L   Chloride 102 98 - 111 mmol/L   CO2 26 22 - 32 mmol/L   Glucose, Bld 111 (H) 70 - 99 mg/dL   BUN 13 8 - 23 mg/dL   Creatinine, Ser 0.67 0.44 - 1.00 mg/dL   Calcium 8.8 (L) 8.9 - 10.3 mg/dL   Total Protein 6.1 (L) 6.5 - 8.1 g/dL   Albumin 3.0 (L) 3.5 - 5.0 g/dL   AST 18 15 - 41 U/L   ALT 14 0 - 44 U/L   Alkaline Phosphatase 74 38 - 126 U/L   Total Bilirubin 0.3 0.3 - 1.2 mg/dL   GFR calc non Af Amer >60 >60 mL/min   GFR calc Af Amer >60 >60 mL/min   Anion gap 11 5 - 15        Assessment & Plan:      ICD-10-CM   1. Acute right-sided thoracic back pain  M54.6 diclofenac Sodium (VOLTAREN) 1 % GEL    Ambulatory referral to Orthopedic Surgery    Patient has been complaining of the same area of muscle pain for at least 8 to 10 months-it was discussed previously by other providers at the same office and has been discussed multiple times in multiple encounters with me.  Patient did get some improvement with doing physical therapy but she unfortunately had a recent acute severe burning and sharp pain without any aggravating motion or recent activity.  She would like an MRI but I have suggested getting evaluation by an orthopedic specialist who would be able to do a further in-depth assessment and determine if MRI is the appropriate next step or imaging modality.  In the past she has tried Mobic  and muscle relaxers, encouraged her to use heat and/or ice therapy, over-the-counter medications and can try diclofenac gel topically to see if that helps with pain    Delsa Grana, PA-C 05/09/19 11:44 AM

## 2019-06-11 ENCOUNTER — Other Ambulatory Visit: Payer: Self-pay | Admitting: Orthopedic Surgery

## 2019-06-11 DIAGNOSIS — M546 Pain in thoracic spine: Secondary | ICD-10-CM

## 2019-06-11 DIAGNOSIS — M5134 Other intervertebral disc degeneration, thoracic region: Secondary | ICD-10-CM

## 2019-06-11 DIAGNOSIS — S22009A Unspecified fracture of unspecified thoracic vertebra, initial encounter for closed fracture: Secondary | ICD-10-CM

## 2019-06-26 ENCOUNTER — Other Ambulatory Visit: Payer: Self-pay

## 2019-06-26 ENCOUNTER — Ambulatory Visit
Admission: RE | Admit: 2019-06-26 | Discharge: 2019-06-26 | Disposition: A | Discharge status: Home / Self Care | Payer: Medicare Other | Source: Ambulatory Visit | Attending: Orthopedic Surgery | Admitting: Orthopedic Surgery

## 2019-06-26 DIAGNOSIS — S22009A Unspecified fracture of unspecified thoracic vertebra, initial encounter for closed fracture: Secondary | ICD-10-CM | POA: Diagnosis present

## 2019-06-26 DIAGNOSIS — M546 Pain in thoracic spine: Secondary | ICD-10-CM | POA: Diagnosis present

## 2019-06-26 DIAGNOSIS — M5134 Other intervertebral disc degeneration, thoracic region: Secondary | ICD-10-CM | POA: Diagnosis present

## 2019-07-17 ENCOUNTER — Ambulatory Visit (INDEPENDENT_AMBULATORY_CARE_PROVIDER_SITE_OTHER): Payer: Medicare Other

## 2019-07-17 VITALS — BP 143/88 | Temp 97.6°F | Ht 64.0 in | Wt 208.0 lb

## 2019-07-17 DIAGNOSIS — Z0000 Encounter for general adult medical examination without abnormal findings: Secondary | ICD-10-CM | POA: Diagnosis not present

## 2019-07-17 NOTE — Progress Notes (Addendum)
Subjective:   Allison Phillips is a 69 y.o. female who presents for Medicare Annual (Subsequent) preventive examination.  Virtual Visit via Telephone Note  I connected with  Allison Phillips on 07/17/19 at  8:40 AM EDT by telephone and verified that I am speaking with the correct person using two identifiers.  Medicare Annual Wellness visit completed telephonically due to Covid-19 pandemic.   Location: Patient: home Provider: office   I discussed the limitations, risks, security and privacy concerns of performing an evaluation and management service by telephone and the availability of in person appointments. The patient expressed understanding and agreed to proceed.  Unable to perform video visit due to video visit attempted and failed and/or patient does not have video capability.   Some vital signs may be absent or patient reported.   Clemetine Marker, LPN     Review of Systems     Cardiac Risk Factors include: advanced age (>50men, >62 women);obesity (BMI >30kg/m2);smoking/ tobacco exposure     Objective:    Today's Vitals   07/17/19 0913  BP: (!) 143/88  Temp: 97.6 F (36.4 C)  Weight: 208 lb (94.3 kg)  Height: 5\' 4"  (1.626 m)   Body mass index is 35.7 kg/m.  Advanced Directives 07/17/2019 01/18/2019 01/18/2019 01/08/2019 07/13/2018 02/17/2018 12/03/2017  Does Patient Have a Medical Advance Directive? No No No No No No No  Would patient like information on creating a medical advance directive? No - Patient declined No - Patient declined No - Patient declined No - Patient declined Yes (MAU/Ambulatory/Procedural Areas - Information given) - No - Patient declined    Current Medications (verified) Outpatient Encounter Medications as of 07/17/2019  Medication Sig  . Cyanocobalamin (B-12) 500 MCG TABS Take 2,500 mcg by mouth daily.   . diclofenac Sodium (VOLTAREN) 1 % GEL Apply 2 g topically 4 (four) times daily as needed (for chronic pain management).  .  Gluc-Chonn-MSM-Boswellia-Vit D (GLUCOSAMINE CHONDROITIN + D3 PO) Take by mouth.  . meloxicam (MOBIC) 7.5 MG tablet Take 7.5 mg by mouth daily.  . Multiple Vitamin (MULTIVITAMIN) tablet Take 1 tablet by mouth daily. Centrum Silver  . Omega-3 Fatty Acids (FISH OIL PO) Take 1,200 mg daily by mouth.   . [DISCONTINUED] Ampicillin-Sulbactam 3 g in sodium chloride 0.9 % 100 mL Inject 3 g into the vein every 6 (six) hours. q6hrs until 02/15/19 (Patient not taking: Reported on 02/20/2019)  . [DISCONTINUED] Cholecalciferol (VITAMIN D3) 20 MCG TABS Take 25 mcg by mouth daily.   No facility-administered encounter medications on file as of 07/17/2019.    Allergies (verified) Adhesive [tape]   History: Past Medical History:  Diagnosis Date  . Bursitis of left hip   . Depression   . Diverticulitis   . Hypertension   . Joint pain   . Obesity   . Osteopenia 04/20/2017   April 2019; next DEXA April 2021  . Osteoporosis   . Prediabetes   . Reflux   . Tobacco use    Past Surgical History:  Procedure Laterality Date  . ANAL FISSURE REPAIR    . APPENDECTOMY    . [place]  . COLONOSCOPY WITH PROPOFOL N/A 12/18/2014   Procedure: COLONOSCOPY WITH PROPOFOL;  Surgeon: Christene Lye, MD;  Location: [facility] ENDOSCOPY;  Service: Endoscopy;  Laterality: N/A;  . COLONOSCOPY WITH PROPOFOL N/A 02/17/2018   Procedure: COLONOSCOPY WITH PROPOFOL;  Surgeon: Virgel Manifold, MD;  Location: [facility] ENDOSCOPY;  Service: Endoscopy;  Laterality: N/A;  .  LIPOMA EXCISION  07/2015   right shoulder  . LIPOMA EXCISION  11/19/2015  . OOPHORECTOMY     for "fibroids" not sure which ovary   Family History  Problem Relation Age of Onset  . Hypertension Mother   . Kidney disease Mother   . Alzheimer's disease Mother   . Thyroid cancer Son   . Lung cancer Brother   . Rectal cancer Brother   . Breast cancer Neg Hx    Social History   Socioeconomic History  . Marital status: Married    Spouse name:  Allison Phillips  . Number of children: 1  . Years of education: Not on file  . Highest education level: Bachelor's degree (e.g., BA, AB, BS)  Occupational History  . Occupation: Retired  Tobacco Use  . Smoking status: Current Every Day Smoker    Packs/day: 0.50    Years: 15.00    Pack years: 7.50    Types: Cigarettes    Last attempt to quit: 10/24/2016    Years since quitting: 2.7  . Smokeless tobacco: Never Used  . Tobacco comment: smoking cessation information provided  Vaping Use  . Vaping Use: Never used  Substance and Sexual Activity  . Alcohol use: Yes    Alcohol/week: 4.0 standard drinks    Types: 4 Standard drinks or equivalent per week    Comment: on occasion  . Drug use: No  . Sexual activity: Yes    Partners: Male  Other Topics Concern  . Not on file  Social History Narrative  . Not on file   Social Determinants of Health   Financial Resource Strain: Low Risk   . Difficulty of Paying Living Expenses: Not hard at all  Food Insecurity: No Food Insecurity  . Worried About Charity fundraiser in the Last Year: Never true  . Ran Out of Food in the Last Year: Never true  Transportation Needs: No Transportation Needs  . Lack of Transportation (Medical): No  . Lack of Transportation (Non-Medical): No  Physical Activity: Inactive  . Days of Exercise per Week: 0 days  . Minutes of Exercise per Session: 0 min  Stress: No Stress Concern Present  . Feeling of Stress : Not at all  Social Connections: Socially Integrated  . Frequency of Communication with Friends and Family: More than three times a week  . Frequency of Social Gatherings with Friends and Family: Twice a week  . Attends Religious Services: More than 4 times per year  . Active Member of Clubs or Organizations: Yes  . Attends Archivist Meetings: More than 4 times per year  . Marital Status: Married    Tobacco Counseling Ready to quit: Yes Counseling given: Yes Comment: smoking cessation information  provided   Clinical Intake:  Pre-visit preparation completed: Yes  Pain : No/denies pain     BMI - recorded: 35.7 Nutritional Status: BMI > 30  Obese Nutritional Risks: None Diabetes: No  How often do you need to have someone help you when you read instructions, pamphlets, or other written materials from your doctor or pharmacy?: 1 - Never    Interpreter Needed?: No  Information entered by :: Clemetine Marker LPN   Activities of Daily Living In your present state of health, do you have any difficulty performing the following activities: 07/17/2019 03/19/2019  Hearing? N N  Comment declines hearing aids -  Vision? N N  Difficulty concentrating or making decisions? N N  Walking or climbing stairs? N N  Dressing or bathing? N N  Doing errands, shopping? N N  Preparing Food and eating ? N -  Using the Toilet? N -  In the past six months, have you accidently leaked urine? N -  Do you have problems with loss of bowel control? N -  Managing your Medications? N -  Managing your Finances? N -  Housekeeping or managing your Housekeeping? N -  Some recent data might be hidden    Patient Care Team: Delsa Grana, PA-C as PCP - General (Family Medicine) Christene Lye, MD (General Surgery) Sharlet Salina, MD as Referring Physician (Physical Medicine and Rehabilitation) Hessie Knows, MD as Consulting Physician (Orthopedic Surgery) Renata Caprice as Physician Assistant (Orthopedic Surgery)  Indicate any recent Medical Services you may have received from other than [facility] providers in the past year (date may be approximate).     Assessment:   This is a routine wellness examination for Noreen.  Hearing/Vision screen  Hearing Screening   125Hz  250Hz  500Hz  1000Hz  2000Hz  3000Hz  4000Hz  6000Hz  8000Hz   Right ear:           Left ear:           Comments: Pt denies hearing difficulty  Vision Screening Comments: Annual vision screenings at [place] issues and  exercise activities discussed: Current Exercise Habits: The patient does not participate in regular exercise at present, Exercise limited by: orthopedic condition(s)  Goals    . DIET - INCREASE WATER INTAKE     Recommend drinking 6-8 glasses of water per day    . Quit smoking / using tobacco    . Weight (lb) < 200 lb (90.7 kg)      Depression Screen PHQ 2/9 Scores 07/17/2019 05/09/2019 03/19/2019 02/20/2019 02/01/2019 01/17/2019 12/18/2018  PHQ - 2 Score 0 0 0 1 1 2  0  PHQ- 9 Score - 0 1 2 4 7  0    Fall Risk Fall Risk  07/17/2019 05/09/2019 03/19/2019 02/20/2019 02/01/2019  Falls in the past year? 0 0 0 0 0  Number falls in past yr: 0 0 0 0 0  Injury with Fall? 0 0 0 0 0  Risk for fall due to : No Fall Risks - - - -  Follow up Falls prevention discussed Falls evaluation completed - - -    Any stairs in or around the home? Yes  If so, are there any without handrails? Yes  Home free of loose throw rugs in walkways, pet beds, electrical cords, etc? Yes  Adequate lighting in your home to reduce risk of falls? Yes   ASSISTIVE DEVICES UTILIZED TO PREVENT FALLS:  Life alert? No  Use of a cane, walker or w/c? No  Grab bars in the bathroom? Yes  Shower chair or bench in shower? No  Elevated toilet seat or a handicapped toilet? No   TIMED UP AND GO:  Was the test performed? No . Telephonic visit.   Cognitive Function: 6CIT deferred for 2021 AWV; pt has no memory issues.      6CIT Screen 12/06/2016  What Year? 0 points  What month? 0 points  What time? 0 points  Count back from 20 0 points  Months in reverse 0 points  Repeat phrase 0 points  Total Score 0    Immunizations Immunization History  Administered Date(s) Administered  . Fluad Quad(high Dose 65+) 10/19/2018  . Influenza, High Dose Seasonal PF 11/14/2017  . Moderna SARS-COVID-2 Vaccination 03/02/2019, 04/02/2019  . Pneumococcal Conjugate-13  12/06/2016  . Pneumococcal Polysaccharide-23 11/02/2010, 08/28/2018, 01/19/2019    . Td 10/23/2007  . Zoster 11/16/2011    TDAP status: Due, Education has been provided regarding the importance of this vaccine. Advised may receive this vaccine at local pharmacy or Health Dept. Aware to provide a copy of the vaccination record if obtained from local pharmacy or Health Dept. Verbalized acceptance and understanding.   Flu Vaccine status: Completed at today's visit   Pneumococcal vaccine status: Completed during today's visit.   Covid-19 vaccine status: Completed vaccines  Qualifies for Shingles Vaccine? Yes   Zostavax completed Yes   Shingrix Completed?: No.    Education has been provided regarding the importance of this vaccine. Patient has been advised to call insurance company to determine out of pocket expense if they have not yet received this vaccine. Advised may also receive vaccine at local pharmacy or Health Dept. Verbalized acceptance and understanding.  Screening Tests Health Maintenance  Topic Date Due  . URINE MICROALBUMIN  12/24/2017  . DEXA SCAN  04/21/2019  . TETANUS/TDAP  01/19/2020 (Originally 10/22/2017)  . INFLUENZA VACCINE  08/19/2019  . MAMMOGRAM  03/06/2020  . COLONOSCOPY  02/18/2023  . COVID-19 Vaccine  Completed  . Hepatitis C Screening  Completed  . PNA vac Low Risk Adult  Completed    Health Maintenance  Health Maintenance Due  Topic Date Due  . URINE MICROALBUMIN  12/24/2017  . DEXA SCAN  04/21/2019    Colorectal cancer screening: Completed 02/17/18. Repeat every 5 years   Mammogram status: Completed 03/07/19. Repeat every year   Bone Density status: Completed 04/20/17. Results reflect: Bone density results: OSTEOPENIA. Repeat every 2 years. Pt declines repeat screening at this time, would like to discuss at next visit.   Lung Cancer Screening: (Low Dose CT Chest recommended if Age 85-80 years, 30 pack-year currently smoking OR have quit w/in 15years.) does not qualify.    Additional Screening:  Hepatitis C Screening: does  qualify; Completed 12/24/16  Vision Screening: Recommended annual ophthalmology exams for early detection of glaucoma and other disorders of the eye. Is the patient up to date with their annual eye exam?  Yes  Who is the provider or what is the name of the office in which the patient attends annual eye exams? [facility]  Dental Screening: Recommended annual dental exams for proper oral hygiene  Community Resource Referral / Chronic Care Management: CRR required this visit?  No   CCM required this visit?  No      Plan:     I have personally reviewed and noted the following in the patient's chart:   . Medical and social history . Use of alcohol, tobacco or illicit drugs  . Current medications and supplements . Functional ability and status . Nutritional status . Physical activity . Advanced directives . List of other physicians . Hospitalizations, surgeries, and ER visits in previous 12 months . Vitals . Screenings to include cognitive, depression, and falls . Referrals and appointments  In addition, I have reviewed and discussed with patient certain preventive protocols, quality metrics, and best practice recommendations. A written personalized care plan for preventive services as well as general preventive health recommendations were provided to patient.     Clemetine Marker, LPN   [id number]   Nurse Notes: none

## 2019-07-17 NOTE — Patient Instructions (Signed)
Allison Phillips , Thank you for taking time to come for your Medicare Wellness Visit. I appreciate your ongoing commitment to your health goals. Please review the following plan we discussed and let me know if I can assist you in the future.   Screening recommendations/referrals: Colonoscopy: done 02/17/18 Mammogram: done 03/07/19.  Bone Density: done 04/20/17.  Recommended yearly ophthalmology/optometry visit for glaucoma screening and checkup Recommended yearly dental visit for hygiene and checkup  Vaccinations: Influenza vaccine: done 10/19/18 Pneumococcal vaccine: done 08/28/18 Tdap vaccine: DUE Shingles vaccine: Shingrix discussed. Please contact your pharmacy for coverage information.  Covid-19: done 03/02/19 7 04/02/19  Conditions/risks identified: If you wish to quit smoking, help is available. For free tobacco cessation program offerings call the [facility] at 520-271-4711 or Live Well Line at 781-703-2242. You may also visit www.[place].com or email [email or web address] [email or web address] for more information on other programs.   Next appointment: Follow up in one year for your annual wellness visit    Preventive Care 65 Years and Older, Female Preventive care refers to lifestyle choices and visits with your health care provider that can promote health and wellness. What does preventive care include?  A yearly physical exam. This is also called an annual well check.  Dental exams once or twice a year.  Routine eye exams. Ask your health care provider how often you should have your eyes checked.  Personal lifestyle choices, including:  Daily care of your teeth and gums.  Regular physical activity.  Eating a healthy diet.  Avoiding tobacco and drug use.  Limiting alcohol use.  Practicing safe sex.  Taking low-dose aspirin every day.  Taking vitamin and mineral supplements as recommended by your health care provider. What happens during an annual well check? The  services and screenings done by your health care provider during your annual well check will depend on your age, overall health, lifestyle risk factors, and family history of disease. Counseling  Your health care provider may ask you questions about your:  Alcohol use.  Tobacco use.  Drug use.  Emotional well-being.  Home and relationship well-being.  Sexual activity.  Eating habits.  History of falls.  Memory and ability to understand (cognition).  Work and work Statistician.  Reproductive health. Screening  You may have the following tests or measurements:  Height, weight, and BMI.  Blood pressure.  Lipid and cholesterol levels. These may be checked every 5 years, or more frequently if you are over 57 years old.  Skin check.  Lung cancer screening. You may have this screening every year starting at age 25 if you have a 30-pack-year history of smoking and currently smoke or have quit within the past 15 years.  Fecal occult blood test (FOBT) of the stool. You may have this test every year starting at age 38.  Flexible sigmoidoscopy or colonoscopy. You may have a sigmoidoscopy every 5 years or a colonoscopy every 10 years starting at age 57.  Hepatitis C blood test.  Hepatitis B blood test.  Sexually transmitted disease (STD) testing.  Diabetes screening. This is done by checking your blood sugar (glucose) after you have not eaten for a while (fasting). You may have this done every 1-3 years.  Bone density scan. This is done to screen for osteoporosis. You may have this done starting at age 59.  Mammogram. This may be done every 1-2 years. Talk to your health care provider about how often you should have regular mammograms. Talk with your health care  provider about your test results, treatment options, and if necessary, the need for more tests. Vaccines  Your health care provider may recommend certain vaccines, such as:  Influenza vaccine. This is recommended  every year.  Tetanus, diphtheria, and acellular pertussis (Tdap, Td) vaccine. You may need a Td booster every 10 years.  Zoster vaccine. You may need this after age 49.  Pneumococcal 13-valent conjugate (PCV13) vaccine. One dose is recommended after age 61.  Pneumococcal polysaccharide (PPSV23) vaccine. One dose is recommended after age 68. Talk to your health care provider about which screenings and vaccines you need and how often you need them. This information is not intended to replace advice given to you by your health care provider. Make sure you discuss any questions you have with your health care provider. Document Released: 01/31/2015 Document Revised: 09/24/2015 Document Reviewed: 11/05/2014 Elsevier Interactive Patient Education  2017 [place] Prevention in the Home Falls can cause injuries. They can happen to people of all ages. There are many things you can do to make your home safe and to help prevent falls. What can I do on the outside of my home?  Regularly fix the edges of walkways and driveways and fix any cracks.  Remove anything that might make you trip as you walk through a door, such as a raised step or threshold.  Trim any bushes or trees on the path to your home.  Use bright outdoor lighting.  Clear any walking paths of anything that might make someone trip, such as rocks or tools.  Regularly check to see if handrails are loose or broken. Make sure that both sides of any steps have handrails.  Any raised decks and porches should have guardrails on the edges.  Have any leaves, snow, or ice cleared regularly.  Use sand or salt on walking paths during winter.  Clean up any spills in your garage right away. This includes oil or grease spills. What can I do in the bathroom?  Use night lights.  Install grab bars by the toilet and in the tub and shower. Do not use towel bars as grab bars.  Use non-skid mats or decals in the tub or shower.  If  you need to sit down in the shower, use a plastic, non-slip stool.  Keep the floor dry. Clean up any water that spills on the floor as soon as it happens.  Remove soap buildup in the tub or shower regularly.  Attach bath mats securely with double-sided non-slip rug tape.  Do not have throw rugs and other things on the floor that can make you trip. What can I do in the bedroom?  Use night lights.  Make sure that you have a light by your bed that is easy to reach.  Do not use any sheets or blankets that are too big for your bed. They should not hang down onto the floor.  Have a firm chair that has side arms. You can use this for support while you get dressed.  Do not have throw rugs and other things on the floor that can make you trip. What can I do in the kitchen?  Clean up any spills right away.  Avoid walking on wet floors.  Keep items that you use a lot in easy-to-reach places.  If you need to reach something above you, use a strong step stool that has a grab bar.  Keep electrical cords out of the way.  Do not use floor polish  or wax that makes floors slippery. If you must use wax, use non-skid floor wax.  Do not have throw rugs and other things on the floor that can make you trip. What can I do with my stairs?  Do not leave any items on the stairs.  Make sure that there are handrails on both sides of the stairs and use them. Fix handrails that are broken or loose. Make sure that handrails are as long as the stairways.  Check any carpeting to make sure that it is firmly attached to the stairs. Fix any carpet that is loose or worn.  Avoid having throw rugs at the top or bottom of the stairs. If you do have throw rugs, attach them to the floor with carpet tape.  Make sure that you have a light switch at the top of the stairs and the bottom of the stairs. If you do not have them, ask someone to add them for you. What else can I do to help prevent falls?  Wear shoes  that:  Do not have high heels.  Have rubber bottoms.  Are comfortable and fit you well.  Are closed at the toe. Do not wear sandals.  If you use a stepladder:  Make sure that it is fully opened. Do not climb a closed stepladder.  Make sure that both sides of the stepladder are locked into place.  Ask someone to hold it for you, if possible.  Clearly mark and make sure that you can see:  Any grab bars or handrails.  First and last steps.  Where the edge of each step is.  Use tools that help you move around (mobility aids) if they are needed. These include:  Canes.  Walkers.  Scooters.  Crutches.  Turn on the lights when you go into a dark area. Replace any light bulbs as soon as they burn out.  Set up your furniture so you have a clear path. Avoid moving your furniture around.  If any of your floors are uneven, fix them.  If there are any pets around you, be aware of where they are.  Review your medicines with your doctor. Some medicines can make you feel dizzy. This can increase your chance of falling. Ask your doctor what other things that you can do to help prevent falls. This information is not intended to replace advice given to you by your health care provider. Make sure you discuss any questions you have with your health care provider. Document Released: 10/31/2008 Document Revised: 06/12/2015 Document Reviewed: 02/08/2014 Elsevier Interactive Patient Education  2017 [employer].

## 2019-10-05 NOTE — Progress Notes (Signed)
Name: Allison Phillips   MRN: [id number]    DOB: Sep 09, 1950   Date:10/08/2019       Progress Note  Chief Complaint  Patient presents with  . Follow-up  . Hypertension  . Hyperlipidemia  . Diabetes     Subjective:   Allison Phillips is a 69 y.o. female, presents to clinic for routine f/up  Hypertension:  Currently not  managed with any medications Blood pressure today is well controlled. BP Readings from Last 3 Encounters:  10/08/19 128/62  07/17/19 (!) 143/88  05/09/19 (!) 142/84   Pt denies CP, SOB, exertional sx, LE edema, palpitation, Ha's, visual disturbances, lightheadedness, hypotension, syncope. Dietary efforts for BP? Working on diet/lifestyle for HTN and prediabetes  Hx of Prediabetes: Recent pertinent labs: Lab Results  Component Value Date   HGBA1C 5.6 12/18/2018   HGBA1C 5.7 (H) 08/28/2018   HGBA1C 5.5 01/06/2018   Anemia after infection/liver abscess and procedures last year. Hemoglobin  Date Value Ref Range Status  01/23/2019 10.9 (L) 12.0 - 15.0 g/dL Final  01/22/2019 11.3 (L) 12.0 - 15.0 g/dL Final  01/21/2019 11.5 (L) 12.0 - 15.0 g/dL Final  01/20/2019 10.3 (L) 12.0 - 15.0 g/dL Final  11/26/2015 12.8 11.1 - 15.9 g/dL Final  11/25/2014 13.7 11.1 - 15.9 g/dL Final  recheck labs today Pt denies fatigue, DOE, cold intolerance, near syncope, rapid HR  No abd sx  Hx of MDD: Not on meds Depression screen [facility] 2/9 10/08/2019 07/17/2019 05/09/2019  Decreased Interest 0 0 0  Down, Depressed, Hopeless 0 0 0  PHQ - 2 Score 0 0 0  Altered sleeping 0 - 0  Tired, decreased energy 0 - 0  Change in appetite 0 - 0  Feeling bad or failure about yourself  0 - 0  Trouble concentrating 0 - 0  Moving slowly or fidgety/restless 0 - 0  Suicidal thoughts 0 - 0  PHQ-9 Score 0 - 0  Difficult doing work/chores Not difficult at all - Not difficult at all  Some recent data might be hidden   phq reviewed and neg, mood good  Mostly on supplements only at this time,  or using OTC meds for MSK aches and pains, upper back much better since doing PT      Current Outpatient Medications:  .  Cyanocobalamin (B-12) 500 MCG TABS, Take 2,500 mcg by mouth daily. , Disp: , Rfl:  .  diclofenac Sodium (VOLTAREN) 1 % GEL, Apply 2 g topically 4 (four) times daily as needed (for chronic pain management)., Disp: 100 g, Rfl: 5 .  Gluc-Chonn-MSM-Boswellia-Vit D (GLUCOSAMINE CHONDROITIN + D3 PO), Take by mouth., Disp: , Rfl:  .  meloxicam (MOBIC) 7.5 MG tablet, Take 7.5 mg by mouth daily., Disp: , Rfl:  .  Multiple Vitamin (MULTIVITAMIN) tablet, Take 1 tablet by mouth daily. Centrum Silver, Disp: , Rfl:  .  Omega-3 Fatty Acids (FISH OIL PO), Take 1,200 mg daily by mouth. , Disp: , Rfl:   Patient Active Problem List   Diagnosis Date Noted  . Hyperglycemia 03/27/2019  . Hepatic abscess 01/18/2019  . Prediabetes 01/06/2018  . Obesity (BMI 35.0-39.9 without comorbidity) 01/06/2018  . CKD (chronic kidney disease) stage 2, GFR 60-89 ml/min 07/07/2017  . Osteopenia 04/20/2017  . Hip pain, chronic, left 03/18/2017  . Lower back pain 03/18/2017  . Sacroiliac joint pain 03/18/2017  . Screening for osteoporosis 12/30/2016  . Preventative health care 12/07/2016  . Encounter for hepatitis C screening test for low risk  patient 12/06/2016  . Essential hypertension, benign 03/08/2016  . Cervical pain (neck) 12/31/2014  . Lipoma of skin and subcutaneous tissue of neck 08/07/2014  . Reflux   . Depression   . Bursitis of left hip     Past Surgical History:  Procedure Laterality Date  . ANAL FISSURE REPAIR    . APPENDECTOMY    . [place]  . COLONOSCOPY WITH PROPOFOL N/A 12/18/2014   Procedure: COLONOSCOPY WITH PROPOFOL;  Surgeon: Christene Lye, MD;  Location: [facility] ENDOSCOPY;  Service: Endoscopy;  Laterality: N/A;  . COLONOSCOPY WITH PROPOFOL N/A 02/17/2018   Procedure: COLONOSCOPY WITH PROPOFOL;  Surgeon: Virgel Manifold, MD;  Location: [facility]  ENDOSCOPY;  Service: Endoscopy;  Laterality: N/A;  . LIPOMA EXCISION  07/2015   right shoulder  . LIPOMA EXCISION  11/19/2015  . OOPHORECTOMY     for "fibroids" not sure which ovary    Family History  Problem Relation Age of Onset  . Hypertension Mother   . Kidney disease Mother   . Alzheimer's disease Mother   . Thyroid cancer Son   . Lung cancer Brother   . Rectal cancer Brother   . Breast cancer Neg Hx     Social History   Tobacco Use  . Smoking status: Current Every Day Smoker    Packs/day: 0.50    Years: 15.00    Pack years: 7.50    Types: Cigarettes    Last attempt to quit: 10/24/2016    Years since quitting: 2.9  . Smokeless tobacco: Never Used  . Tobacco comment: smoking cessation information provided  Vaping Use  . Vaping Use: Never used  Substance Use Topics  . Alcohol use: Yes    Alcohol/week: 4.0 standard drinks    Types: 4 Standard drinks or equivalent per week    Comment: on occasion  . Drug use: No     Allergies  Allergen Reactions  . Adhesive [Tape]     Rash/dermatitis    Health Maintenance  Topic Date Due  . URINE MICROALBUMIN  12/24/2017  . DEXA SCAN  04/21/2019  . INFLUENZA VACCINE  08/19/2019  . TETANUS/TDAP  01/19/2020 (Originally 10/22/2017)  . MAMMOGRAM  03/06/2020  . COLONOSCOPY  02/18/2023  . COVID-19 Vaccine  Completed  . Hepatitis C Screening  Completed  . PNA vac Low Risk Adult  Completed    Chart Review Today: I personally reviewed active problem list, medication list, allergies, family history, social history, health maintenance, notes from last encounter, lab results, imaging with the patient/caregiver today.   Review of Systems  10 Systems reviewed and are negative for acute change except as noted in the HPI.  Objective:   Vitals:   10/08/19 0815  BP: 128/62  Pulse: 68  Resp: 14  Temp: 98.4 F (36.9 C)  SpO2: 97%  Weight: 207 lb 11.2 oz (94.2 kg)  Height: 5\' 4"  (1.626 m)    Body mass index is 35.65  kg/m.  Physical Exam Vitals and nursing note reviewed.  Constitutional:      General: She is not in acute distress.    Appearance: Normal appearance. She is not ill-appearing, toxic-appearing or diaphoretic.  HENT:     Head: Normocephalic and atraumatic.     Right Ear: External ear normal.     Left Ear: External ear normal.  Eyes:     General:        Right eye: No discharge.        Left eye:  No discharge.     Conjunctiva/sclera: Conjunctivae normal.  Cardiovascular:     Rate and Rhythm: Normal rate and regular rhythm.     Pulses: Normal pulses.     Heart sounds: Normal heart sounds.  Pulmonary:     Effort: Pulmonary effort is normal.     Breath sounds: Normal breath sounds.  Abdominal:     General: Bowel sounds are normal. There is no distension.     Palpations: Abdomen is soft.     Tenderness: There is no abdominal tenderness.  Skin:    General: Skin is warm and dry.     Capillary Refill: Capillary refill takes less than 2 seconds.     Coloration: Skin is not jaundiced or pale.  Neurological:     Mental Status: She is alert. Mental status is at baseline.     Gait: Gait normal.  Psychiatric:        Mood and Affect: Mood normal.        Behavior: Behavior normal.         Assessment & Plan:     ICD-10-CM   1. Essential hypertension, benign  T02 COMPLETE METABOLIC PANEL WITH GFR   Blood pressure at goal hypertension well-controlled with diet lifestyle, continue monitoring  2. Prediabetes  T11.73 COMPLETE METABOLIC PANEL WITH GFR    Hemoglobin A1c    CANCELED: Hemoglobin A1c   History of prediabetes she has worked on healthy lifestyle changes recheck labs today  3. CKD (chronic kidney disease) stage 2, GFR 60-89 ml/min  V67.0 COMPLETE METABOLIC PANEL WITH GFR  4. Anemia, unspecified type  L41.0 COMPLETE METABOLIC PANEL WITH GFR   Anemia after surgery and liver abscess no current symptoms recheck labs today to ensure they have normalized  5. Need for influenza  vaccination  Z23 Flu Vaccine QUAD High Dose(Fluad)  6. Encounter for medication monitoring  V01.31 COMPLETE METABOLIC PANEL WITH GFR    CBC with Differential/Platelet    Hemoglobin A1c    CANCELED: CBC with Differential/Platelet    CANCELED: Hemoglobin A1c  7. Encounter for screening mammogram for malignant neoplasm of breast  Z12.31 MM 3D SCREEN BREAST BILATERAL  8. Postmenopausal estrogen deficiency  Z78.0 DG Bone Density   Discussed with pt today osteoporosis prevention, daily vitamin D calcium supplement doses, types of exercise and activity that can help prevent osteoporosis, ordered her bone scan and mammogram gave her configuration to schedule  Return in about 6 months (around 04/06/2020) for Routine follow-up.   Delsa Grana, PA-C 10/08/19 8:18 AM

## 2019-10-08 ENCOUNTER — Encounter: Payer: Self-pay | Admitting: Family Medicine

## 2019-10-08 ENCOUNTER — Other Ambulatory Visit: Payer: Self-pay

## 2019-10-08 ENCOUNTER — Ambulatory Visit (INDEPENDENT_AMBULATORY_CARE_PROVIDER_SITE_OTHER): Payer: Medicare Other | Admitting: Family Medicine

## 2019-10-08 VITALS — BP 128/62 | HR 68 | Temp 98.4°F | Resp 14 | Ht 64.0 in | Wt 207.7 lb

## 2019-10-08 DIAGNOSIS — Z23 Encounter for immunization: Secondary | ICD-10-CM | POA: Diagnosis not present

## 2019-10-08 DIAGNOSIS — N182 Chronic kidney disease, stage 2 (mild): Secondary | ICD-10-CM

## 2019-10-08 DIAGNOSIS — D649 Anemia, unspecified: Secondary | ICD-10-CM

## 2019-10-08 DIAGNOSIS — I10 Essential (primary) hypertension: Secondary | ICD-10-CM

## 2019-10-08 DIAGNOSIS — R7303 Prediabetes: Secondary | ICD-10-CM

## 2019-10-08 DIAGNOSIS — Z1231 Encounter for screening mammogram for malignant neoplasm of breast: Secondary | ICD-10-CM

## 2019-10-08 DIAGNOSIS — Z5181 Encounter for therapeutic drug level monitoring: Secondary | ICD-10-CM

## 2019-10-08 DIAGNOSIS — Z780 Asymptomatic menopausal state: Secondary | ICD-10-CM

## 2019-10-08 NOTE — Patient Instructions (Signed)
[facility] [place],  [place]  [id number] Get Driving Directions Main: [phone]  Call to set up your bone density and mammogram Bone scan you can do when you would like since it is due Mammogram would be next February You may be able to set them both up next spring and get done together   Preventing Osteoporosis, Adult Osteoporosis is a condition that causes the bones to lose density. This means that the bones become thinner, and the normal spaces in bone tissue become larger. Low bone density can make the bones weak and cause them to break more easily. Osteoporosis cannot always be prevented, but you can take steps to lower your risk of developing this condition. How can this condition affect me? If you develop osteoporosis, you will be more likely to break bones in your wrist, spine, or hip. Even a minor accident or injury can be enough to break weak bones. The bones will also be slower to heal. Osteoporosis can cause other problems as well, such as a stooped posture or trouble with movement. Osteoporosis can occur with aging. As you get older, you may lose bone tissue more quickly, or it may be replaced more slowly. Osteoporosis is more likely to develop if you have poor nutrition or do not get enough calcium or vitamin D. Other lifestyle factors can also play a role. By eating a well-balanced diet and making lifestyle changes, you can help keep your bones strong and healthy, lowering your chances of developing osteoporosis. What can increase my risk? The following factors may make you more likely to develop osteoporosis:  Having a family history of the condition.  Having poor nutrition or not getting enough calcium or vitamin D.  Using certain medicines, such as steroid medicines or antiseizure medicines.  Being any of the following: ? 54 years of age or older. ? Female. ? A woman who has gone through menopause (is  postmenopausal). ? White (Caucasian) or of Asian descent.  Smoking or having a history of smoking.  Not being physically active (being sedentary).  Having a small body frame. What actions can I take to prevent this?  Get enough calcium   Make sure you get enough calcium every day. Calcium is the most important mineral for bone health. Most people can get enough calcium from their diet, but supplements may be recommended for people who are at risk for osteoporosis. Follow these guidelines: ? If you are age 44 or younger, aim to get 1,000 mg of calcium every day. ? If you are older than age 33, aim to get 1,200 mg of calcium every day.  Good sources of calcium include: ? Dairy products, such as low-fat or nonfat milk, cheese, and yogurt. ? Dark green leafy vegetables, such as bok choy and broccoli. ? Foods that have had calcium added to them (calcium-fortified foods), such as orange juice, cereal, bread, soy beverages, and tofu products. ? Nuts, such as almonds.  Check nutrition labels to see how much calcium is in a food or drink. Get enough vitamin D  Try to get enough vitamin D every day. Vitamin D is the most essential vitamin for bone health. It helps the body absorb calcium. Follow these guidelines for how much vitamin D to get from food: ? If you are age 69 or younger, aim to get at least 600 international units (IU) every day. Your health care provider may suggest more. ? If you are older than  age 74, aim to get at least 800 international units every day. Your health care provider may suggest more.  Good sources of vitamin D in your diet include: ? Egg yolks. ? Oily fish, such as salmon, sardines, and tuna. ? Milk and cereal fortified with vitamin D.  Your body also makes vitamin D when you are out in the sun. Exposing the bare skin on your face, arms, legs, or back to the sun for no more than 30 minutes a day, 2 times a week is more than enough. Beyond that, make sure you  use sunblock to protect your skin from sunburn, which increases your risk for skin cancer. Exercise  Stay active and get exercise every day.  Ask your health care provider what types of exercise are best for you. Weight-bearing and strength-building activities are important for building and maintaining healthy bones. Some examples of these types of activities include: ? Walking and hiking. ? Jogging and running. ? Dancing. ? Gym exercises. ? Lifting weights. ? Tennis and racquetball. ? Climbing stairs. ? Aerobics. Make other lifestyle changes  Do not use any products that contain nicotine or tobacco, such as cigarettes, e-cigarettes, and chewing tobacco. If you need help quitting, ask your health care provider.  Lose weight if you are overweight.  If you drink alcohol: ? Limit how much you use to:  0-1 drink a day for nonpregnant women.  0-2 drinks a day for men. ? Be aware of how much alcohol is in your drink. In the [place] one drink equals one 12 oz bottle of beer (355 mL), one 5 oz glass of wine (148 mL), or one 1 oz glass of hard liquor (44 mL). Where to find support If you need help making changes to prevent osteoporosis, talk with your health care provider. You can ask for a referral to a diet and nutrition specialist (dietitian) and a physical therapist. Where to find more information Learn more about osteoporosis from:  NIH Osteoporosis and Related [place]: www.bones.SouthExposed.es  [place] Office on [employer] Health: [place].tn  [place]: EquipmentWeekly.com.ee Summary  Osteoporosis is a condition that causes weak bones that are more likely to break.  Eat a healthy diet, making sure you get enough calcium and vitamin D, and stay active by getting regular exercise to help prevent osteoporosis.  Other ways to reduce your risk of osteoporosis include maintaining a healthy weight and avoiding alcohol and products that contain  nicotine or tobacco. This information is not intended to replace advice given to you by your health care provider. Make sure you discuss any questions you have with your health care provider. Document Revised: 08/04/2018 Document Reviewed: 08/04/2018 Elsevier Patient Education  [place].

## 2019-10-09 LAB — COMPLETE METABOLIC PANEL WITH GFR
AG Ratio: 1.8 (calc) (ref 1.0–2.5)
ALT: 11 U/L (ref 6–29)
AST: 16 U/L (ref 10–35)
Albumin: 4.4 g/dL (ref 3.6–5.1)
Alkaline phosphatase (APISO): 51 U/L (ref 37–153)
BUN: 10 mg/dL (ref 7–25)
CO2: 28 mmol/L (ref 20–32)
Calcium: 9.3 mg/dL (ref 8.6–10.4)
Chloride: 102 mmol/L (ref 98–110)
Creat: 0.79 mg/dL (ref 0.50–0.99)
GFR, Est African American: 89 mL/min/{1.73_m2} (ref >=60)
GFR, Est Non African American: 77 mL/min/{1.73_m2} (ref >=60)
Globulin: 2.5 g/dL (calc) (ref 1.9–3.7)
Glucose, Bld: 91 mg/dL (ref 65–99)
Potassium: 4.4 mmol/L (ref 3.5–5.3)
Sodium: 138 mmol/L (ref 135–146)
Total Bilirubin: 0.6 mg/dL (ref 0.2–1.2)
Total Protein: 6.9 g/dL (ref 6.1–8.1)

## 2019-10-09 LAB — CBC WITH DIFFERENTIAL/PLATELET
Absolute Monocytes: 511 cells/uL (ref 200–950)
Basophils Absolute: 62 cells/uL (ref 0–200)
Basophils Relative: 0.9 %
Eosinophils Absolute: 117 cells/uL (ref 15–500)
Eosinophils Relative: 1.7 %
HCT: 43.2 % (ref 35.0–45.0)
Hemoglobin: 14.1 g/dL (ref 11.7–15.5)
Lymphs Abs: 2180 cells/uL (ref 850–3900)
MCH: 28.8 pg (ref 27.0–33.0)
MCHC: 32.6 g/dL (ref 32.0–36.0)
MCV: 88.2 fL (ref 80.0–100.0)
MPV: 10.9 fL (ref 7.5–12.5)
Monocytes Relative: 7.4 %
Neutro Abs: 4030 cells/uL (ref 1500–7800)
Neutrophils Relative %: 58.4 %
Platelets: 303 10*3/uL (ref 140–400)
RBC: 4.9 10*6/uL (ref 3.80–5.10)
RDW: 13.2 % (ref 11.0–15.0)
Total Lymphocyte: 31.6 %
WBC: 6.9 10*3/uL (ref 3.8–10.8)

## 2019-10-09 LAB — HEMOGLOBIN A1C
Hgb A1c MFr Bld: 5.5 % of total Hgb (ref <5.7)
Mean Plasma Glucose: 111 (calc)
eAG (mmol/L): 6.2 (calc)

## 2019-10-10 ENCOUNTER — Encounter: Payer: Self-pay | Admitting: Family Medicine

## 2019-11-02 ENCOUNTER — Telehealth: Payer: Self-pay

## 2019-11-02 NOTE — Telephone Encounter (Signed)
Called pt to inform her that we did not have any openings with it being so late in the day and only one Provider in office. I advised pt to seek care at the [facility]. Pt has an appt on Monday and I explained to her she may need to also do this appt over the phone if she plans to keep it Pt explained to me that this is the same thing she had last year and it was not covid. Pt has been vaccinated. Please advice if pt needs to do phone appt. Copied from [place] [id number]. Topic: Appointment Scheduling - Scheduling Inquiry for Clinic >> Nov 02, 2019  2:47 PM Greggory Keen D wrote: Reason for CRM: Pt called saying she has an appt Monday but she dont know if she can wait until then.  She is having stomach pain middle of stomach area. , throwing up, no fever, 99.0   no diarrhea, having some cold sweats, no headache.    CB#  [phone]

## 2019-11-02 NOTE — Telephone Encounter (Signed)
Left message patient to got to UC

## 2019-11-05 ENCOUNTER — Ambulatory Visit (INDEPENDENT_AMBULATORY_CARE_PROVIDER_SITE_OTHER): Payer: Medicare Other | Admitting: Family Medicine

## 2019-11-05 ENCOUNTER — Other Ambulatory Visit: Payer: Self-pay

## 2019-11-05 ENCOUNTER — Encounter: Payer: Self-pay | Admitting: Family Medicine

## 2019-11-05 VITALS — BP 132/76 | HR 86 | Temp 98.1°F | Resp 16 | Ht 64.0 in | Wt 210.1 lb

## 2019-11-05 DIAGNOSIS — R1032 Left lower quadrant pain: Secondary | ICD-10-CM | POA: Diagnosis not present

## 2019-11-05 NOTE — Progress Notes (Signed)
Patient ID: Allison Phillips, female    DOB: August 13, 1950, 69 y.o.   MRN: [id number]  PCP: Delsa Grana, PA-C  Chief Complaint  Patient presents with  . Abdominal Pain    LLQ seen at [facility]    Subjective:   Allison Phillips is a 69 y.o. female, presents to clinic with CC of the following:  HPI  Patient presents for abdominal pain she is concerned that it is similar to abdominal pain she had last year when she was eventually diagnosed with a liver abscess which was subsequently drained.  She was seen at the walk-in clinic at [facility], was diagnosed with an treated for diverticulitis, she has been taking the antibiotic, her symptoms have been improving.  Last bowel movement was normal but it was a few days ago.  She denies any fever, chills, sweats, nausea, vomiting, diarrhea.  Lab results and records are reviewed through care everywhere   Patient Active Problem List   Diagnosis Date Noted  . Obesity (BMI 35.0-39.9 without comorbidity) 01/06/2018  . Osteopenia 04/20/2017  . Essential hypertension, benign 03/08/2016  . Depression       Current Outpatient Medications:  .  amoxicillin-clavulanate (AUGMENTIN) 875-125 MG tablet, Take by mouth., Disp: , Rfl:  .  Cyanocobalamin (B-12) 500 MCG TABS, Take 2,500 mcg by mouth daily. , Disp: , Rfl:  .  diclofenac Sodium (VOLTAREN) 1 % GEL, Apply 2 g topically 4 (four) times daily as needed (for chronic pain management)., Disp: 100 g, Rfl: 5 .  Gluc-Chonn-MSM-Boswellia-Vit D (GLUCOSAMINE CHONDROITIN + D3 PO), Take by mouth., Disp: , Rfl:  .  Multiple Vitamin (MULTIVITAMIN) tablet, Take 1 tablet by mouth daily. Centrum Silver, Disp: , Rfl:  .  Omega-3 Fatty Acids (FISH OIL PO), Take 1,200 mg daily by mouth. , Disp: , Rfl:  .  ondansetron (ZOFRAN) 4 MG tablet, Take by mouth., Disp: , Rfl:  .  meloxicam (MOBIC) 7.5 MG tablet, Take 7.5 mg by mouth daily. (Patient not taking: Reported on 11/05/2019), Disp: , Rfl:    Allergies    Allergen Reactions  . Adhesive [Tape]     Rash/dermatitis     Social History   Tobacco Use  . Smoking status: Current Every Day Smoker    Packs/day: 0.50    Years: 15.00    Pack years: 7.50    Types: Cigarettes    Last attempt to quit: 10/24/2016    Years since quitting: 3.0  . Smokeless tobacco: Never Used  . Tobacco comment: smoking cessation information provided  Vaping Use  . Vaping Use: Never used  Substance Use Topics  . Alcohol use: Yes    Alcohol/week: 4.0 standard drinks    Types: 4 Standard drinks or equivalent per week    Comment: on occasion  . Drug use: No      Chart Review Today:  I personally reviewed active problem list, medication list, allergies, family history, social history, health maintenance, notes from last encounter, lab results, imaging with the patient/caregiver today.   Review of Systems 10 Systems reviewed and are negative for acute change except as noted in the HPI.     Objective:   Vitals:   11/05/19 1507  BP: 132/76  Pulse: 86  Resp: 16  Temp: 98.1 F (36.7 C)  TempSrc: Oral  SpO2: 98%  Weight: 210 lb 1.6 oz (95.3 kg)  Height: 5\' 4"  (1.626 m)    Body mass index is 36.06 kg/m.  Physical Exam Vitals  and nursing note reviewed.  Constitutional:      General: She is not in acute distress.    Appearance: Normal appearance. She is well-developed. She is not ill-appearing, toxic-appearing or diaphoretic.     Interventions: Face mask in place.  HENT:     Head: Normocephalic and atraumatic.     Right Ear: External ear normal.     Left Ear: External ear normal.  Eyes:     General: Lids are normal. No scleral icterus.       Right eye: No discharge.        Left eye: No discharge.     Conjunctiva/sclera: Conjunctivae normal.  Neck:     Trachea: Phonation normal. No tracheal deviation.  Cardiovascular:     Rate and Rhythm: Normal rate and regular rhythm.     Pulses: Normal pulses.          Radial pulses are 2+ on the right  side and 2+ on the left side.       Posterior tibial pulses are 2+ on the right side and 2+ on the left side.     Heart sounds: Normal heart sounds. No murmur heard.  No friction rub. No gallop.   Pulmonary:     Effort: Pulmonary effort is normal. No respiratory distress.     Breath sounds: Normal breath sounds. No stridor. No wheezing, rhonchi or rales.  Chest:     Chest wall: No tenderness.  Abdominal:     General: Bowel sounds are normal. There is no distension.     Palpations: Abdomen is soft. There is no hepatomegaly, splenomegaly or pulsatile mass.     Tenderness: There is no abdominal tenderness. There is no right CVA tenderness, left CVA tenderness, guarding or rebound. Negative signs include Murphy's sign, Rovsing's sign and McBurney's sign.  Musculoskeletal:     Right lower leg: No edema.     Left lower leg: No edema.  Skin:    General: Skin is warm and dry.     Coloration: Skin is not jaundiced or pale.     Findings: No rash.  Neurological:     Mental Status: She is alert.     Motor: No abnormal muscle tone.     Gait: Gait normal.  Psychiatric:        Mood and Affect: Mood normal.        Speech: Speech normal.        Behavior: Behavior normal.      Results for orders placed or performed in visit on 10/08/19  COMPLETE METABOLIC PANEL WITH GFR  Result Value Ref Range   Glucose, Bld 91 65 - 99 mg/dL   BUN 10 7 - 25 mg/dL   Creat 0.79 0.50 - 0.99 mg/dL   GFR, Est Non African American 77 > OR = 60 mL/min/1.55m2   GFR, Est African American 89 > OR = 60 mL/min/1.38m2   BUN/Creatinine Ratio NOT APPLICABLE 6 - 22 (calc)   Sodium 138 135 - 146 mmol/L   Potassium 4.4 3.5 - 5.3 mmol/L   Chloride 102 98 - 110 mmol/L   CO2 28 20 - 32 mmol/L   Calcium 9.3 8.6 - 10.4 mg/dL   Total Protein 6.9 6.1 - 8.1 g/dL   Albumin 4.4 3.6 - 5.1 g/dL   Globulin 2.5 1.9 - 3.7 g/dL (calc)   AG Ratio 1.8 1.0 - 2.5 (calc)   Total Bilirubin 0.6 0.2 - 1.2 mg/dL   Alkaline phosphatase (APISO)  51 37 -  153 U/L   AST 16 10 - 35 U/L   ALT 11 6 - 29 U/L  CBC with Differential/Platelet  Result Value Ref Range   WBC 6.9 3.8 - 10.8 Thousand/uL   RBC 4.90 3.80 - 5.10 Million/uL   Hemoglobin 14.1 11.7 - 15.5 g/dL   HCT 43.2 35 - 45 %   MCV 88.2 80.0 - 100.0 fL   MCH 28.8 27.0 - 33.0 pg   MCHC 32.6 32.0 - 36.0 g/dL   RDW 13.2 11.0 - 15.0 %   Platelets 303 140 - 400 Thousand/uL   MPV 10.9 7.5 - 12.5 fL   Neutro Abs 4,030 1,500 - 7,800 cells/uL   Lymphs Abs 2,180 850 - 3,900 cells/uL   Absolute Monocytes 511 200 - 950 cells/uL   Eosinophils Absolute 117 15.0 - 500.0 cells/uL   Basophils Absolute 62 0.0 - 200.0 cells/uL   Neutrophils Relative % 58.4 %   Total Lymphocyte 31.6 %   Monocytes Relative 7.4 %   Eosinophils Relative 1.7 %   Basophils Relative 0.9 %  Hemoglobin A1c  Result Value Ref Range   Hgb A1c MFr Bld 5.5 <5.7 % of total Hgb   Mean Plasma Glucose 111 (calc)   eAG (mmol/L) 6.2 (calc)       Assessment & Plan:   1. Left lower quadrant abdominal pain Improving abdominal pain since starting antibiotic, no tenderness to palpation on exam today, abdominal exam benign, no guarding, no rebound, vital signs stable, tolerating POs. patient is concerned and wants a CT scan but I have discussed this with her today I do not think that she currently needs one however if her symptoms have been to worsen with her history would have a lower threshold to get a CT scan. All blood work was reviewed and was normal no white count, her bowel movements have been normal.  Do suspect that if she had diverticulitis it is improving with treatment and if she continues to improve there would be no needed follow-up  We have discussed concerning signs and symptoms which I have encouraged her to follow-up for, encouraged her to continue to slowly progress diet as tolerated      Delsa Grana, PA-C 11/05/19 3:23 PM

## 2019-11-05 NOTE — Patient Instructions (Signed)
Send me a mychart message right away if you have any worsening symptoms and we'll get you rechecked in office and get a CT ordered   Abdominal Pain, Adult Pain in the abdomen (abdominal pain) can be caused by many things. Often, abdominal pain is not serious and it gets better with no treatment or by being treated at home. However, sometimes abdominal pain is serious. Your health care provider will ask questions about your medical history and do a physical exam to try to determine the cause of your abdominal pain. Follow these instructions at home:  Medicines  Take over-the-counter and prescription medicines only as told by your health care provider.  Do not take a laxative unless told by your health care provider. General instructions  Watch your condition for any changes.  Drink enough fluid to keep your urine pale yellow.  Keep all follow-up visits as told by your health care provider. This is important. Contact a health care provider if:  Your abdominal pain changes or gets worse.  You are not hungry or you lose weight without trying.  You are constipated or have diarrhea for more than 2-3 days.  You have pain when you urinate or have a bowel movement.  Your abdominal pain wakes you up at night.  Your pain gets worse with meals, after eating, or with certain foods.  You are vomiting and cannot keep anything down.  You have a fever.  You have blood in your urine. Get help right away if:  Your pain does not go away as soon as your health care provider told you to expect.  You cannot stop vomiting.  Your pain is only in areas of the abdomen, such as the right side or the left lower portion of the abdomen. Pain on the right side could be caused by appendicitis.  You have bloody or black stools, or stools that look like tar.  You have severe pain, cramping, or bloating in your abdomen.  You have signs of dehydration, such as: ? Dark urine, very little urine, or no  urine. ? Cracked lips. ? Dry mouth. ? Sunken eyes. ? Sleepiness. ? Weakness.  You have trouble breathing or chest pain. Summary  Often, abdominal pain is not serious and it gets better with no treatment or by being treated at home. However, sometimes abdominal pain is serious.  Watch your condition for any changes.  Take over-the-counter and prescription medicines only as told by your health care provider.  Contact a health care provider if your abdominal pain changes or gets worse.  Get help right away if you have severe pain, cramping, or bloating in your abdomen. This information is not intended to replace advice given to you by your health care provider. Make sure you discuss any questions you have with your health care provider. Document Revised: 05/15/2018 Document Reviewed: 05/15/2018 Elsevier Patient Education  [place].

## 2019-11-05 NOTE — Telephone Encounter (Signed)
Patient on schedule today at 3

## 2019-11-08 ENCOUNTER — Encounter: Payer: Self-pay | Admitting: Family Medicine

## 2019-11-14 ENCOUNTER — Telehealth: Payer: Self-pay

## 2019-11-14 ENCOUNTER — Encounter: Payer: Self-pay | Admitting: Family Medicine

## 2019-11-14 ENCOUNTER — Ambulatory Visit
Admission: RE | Admit: 2019-11-14 | Discharge: 2019-11-14 | Disposition: A | Discharge status: Home / Self Care | Payer: Medicare Other | Source: Ambulatory Visit | Attending: Family Medicine | Admitting: Family Medicine

## 2019-11-14 ENCOUNTER — Other Ambulatory Visit: Payer: Self-pay

## 2019-11-14 ENCOUNTER — Ambulatory Visit (INDEPENDENT_AMBULATORY_CARE_PROVIDER_SITE_OTHER): Payer: Medicare Other | Admitting: Family Medicine

## 2019-11-14 VITALS — BP 112/68 | HR 72 | Temp 98.3°F | Resp 16 | Ht 64.0 in | Wt 210.6 lb

## 2019-11-14 DIAGNOSIS — I700 Atherosclerosis of aorta: Secondary | ICD-10-CM | POA: Diagnosis not present

## 2019-11-14 DIAGNOSIS — R112 Nausea with vomiting, unspecified: Secondary | ICD-10-CM

## 2019-11-14 DIAGNOSIS — R1030 Lower abdominal pain, unspecified: Secondary | ICD-10-CM

## 2019-11-14 DIAGNOSIS — R319 Hematuria, unspecified: Secondary | ICD-10-CM

## 2019-11-14 DIAGNOSIS — K5732 Diverticulitis of large intestine without perforation or abscess without bleeding: Secondary | ICD-10-CM

## 2019-11-14 LAB — POCT URINALYSIS DIPSTICK
Bilirubin, UA: NEGATIVE
Glucose, UA: NEGATIVE
Ketones, UA: NEGATIVE
Nitrite, UA: NEGATIVE
Protein, UA: POSITIVE — AB
Spec Grav, UA: 1.02 (ref 1.010–1.025)
Urobilinogen, UA: 0.2 E.U./dL
pH, UA: 5 (ref 5.0–8.0)

## 2019-11-14 MED ORDER — ONDANSETRON 4 MG PO TBDP: 4.0000 mg | ORAL_TABLET | Freq: Three times a day (TID) | ORAL | 1 refills | Status: DC | PRN

## 2019-11-14 MED ORDER — CIPROFLOXACIN HCL 500 MG PO TABS: 500.0000 mg | ORAL_TABLET | Freq: Two times a day (BID) | ORAL | 0 refills | Status: AC

## 2019-11-14 MED ORDER — METRONIDAZOLE 500 MG PO TABS: 500.0000 mg | ORAL_TABLET | Freq: Three times a day (TID) | ORAL | 0 refills | Status: AC

## 2019-11-14 NOTE — Progress Notes (Signed)
11/14/19 2:36 PM reviewed CT results - sig for sigmoid diverticulitis   CLINICAL DATA:  Left lower quadrant abdominal pain, diverticulitis.  EXAM: CT ABDOMEN AND PELVIS WITHOUT CONTRAST  TECHNIQUE: Multidetector CT imaging of the abdomen and pelvis was performed following the standard protocol without IV contrast.  COMPARISON:  February 06, 2019.  FINDINGS: Lower chest: No acute abnormality.  Hepatobiliary: No gallstones or biliary dilatation is noted. Stable left hepatic cyst is noted. Hepatic abscess noted on prior exam appears to have resolved.  Pancreas: Unremarkable. No pancreatic ductal dilatation or surrounding inflammatory changes.  Spleen: Normal in size without focal abnormality.  Adrenals/Urinary Tract: Adrenal glands are unremarkable. Kidneys are normal, without renal calculi, focal lesion, or hydronephrosis. Bladder is unremarkable.  Stomach/Bowel: The stomach appears normal. The appendix appears normal. There is no evidence of bowel obstruction. Diverticulitis of proximal sigmoid colon is noted without abscess formation.  Vascular/Lymphatic: Aortic atherosclerosis. No enlarged abdominal or pelvic lymph nodes.  Reproductive: Uterus and bilateral adnexa are unremarkable.  Other: No abdominal wall hernia or abnormality. No abdominopelvic ascites.  Musculoskeletal: No acute or significant osseous findings.  IMPRESSION: 1. Diverticulitis of proximal sigmoid colon is noted without abscess formation. These results will be called to the ordering clinician or representative by the Radiologist Assistant, and communication documented in the PACS or zVision Dashboard.  Aortic Atherosclerosis (ICD10-I70.0).   Electronically Signed   By: Marijo Conception M.D.   On: 11/14/2019 13:51  Called pt and reviewed results Sent in cipro and flagyl - reviewed tx with pt, additional zofran sent in, pt instructed to avoid ETOH Also instructed to f/up if  any worsening     ICD-10-CM   1. Lower abdominal pain  E72.09 COMPLETE METABOLIC PANEL WITH GFR    CBC with Differential/Platelet    POCT urinalysis dipstick    CT Abdomen Pelvis Wo Contrast    Urine Culture   concern for worsening colitis/diverticulitis  2. Non-intractable vomiting with nausea, unspecified vomiting type  O70.9 COMPLETE METABOLIC PANEL WITH GFR    CBC with Differential/Platelet    POCT urinalysis dipstick    CT Abdomen Pelvis Wo Contrast    Urine Culture    ondansetron (ZOFRAN ODT) 4 MG disintegrating tablet  3. Hematuria, unspecified type  R31.9 POCT urinalysis dipstick    CT Abdomen Pelvis Wo Contrast    Urine Culture  4. Aortic atherosclerosis (HCC)  I70.0    per CT scan  5. Sigmoid diverticulitis  K57.32 ciprofloxacin (CIPRO) 500 MG tablet    metroNIDAZOLE (FLAGYL) 500 MG tablet    ondansetron (ZOFRAN ODT) 4 MG disintegrating tablet      Delsa Grana, PA-C

## 2019-11-14 NOTE — Progress Notes (Signed)
Patient ID: Allison Phillips, female    DOB: 1950/09/18, 69 y.o.   MRN: [id number]  PCP: Delsa Grana, PA-C  Chief Complaint  Patient presents with  . Abdominal Pain    follow up    Subjective:   Allison Phillips is a 69 y.o. female, presents to clinic with CC of the following:  HPI  Worsening abd pain, with N, V severe on Sunday 3 d ago recurrent vomiting and wasn't able to keep anything down Emesis was bilious, non-bloody Some bloating and nausea but appetite better, no vomiting for a few days, keeping down fluids and bland foods like toast and apple sauce abd pain is to lower abd, currently moderate to severe, non-radiating, pressure, squeeze, ache - like menstrual cramps Last BM was 3-4 d ago mucous, she is passing gas, last BM was normal, no blood No urinary sx Denies flank pain, CP, SOB, fever, chills, sweats  Recent OV for same was on 10/18, was improving at that time after UC visit for diverticulitis with good labs Recent liver abscess Hx of appendectomy and right oophrectomy      Patient Active Problem List   Diagnosis Date Noted  . Obesity (BMI 35.0-39.9 without comorbidity) 01/06/2018  . Osteopenia 04/20/2017  . Essential hypertension, benign 03/08/2016  . Depression       Current Outpatient Medications:  .  Cyanocobalamin (B-12) 500 MCG TABS, Take 2,500 mcg by mouth daily. , Disp: , Rfl:  .  diclofenac Sodium (VOLTAREN) 1 % GEL, Apply 2 g topically 4 (four) times daily as needed (for chronic pain management)., Disp: 100 g, Rfl: 5 .  Gluc-Chonn-MSM-Boswellia-Vit D (GLUCOSAMINE CHONDROITIN + D3 PO), Take by mouth., Disp: , Rfl:  .  Multiple Vitamin (MULTIVITAMIN) tablet, Take 1 tablet by mouth daily. Centrum Silver, Disp: , Rfl:  .  Omega-3 Fatty Acids (FISH OIL PO), Take 1,200 mg daily by mouth. , Disp: , Rfl:  .  meloxicam (MOBIC) 7.5 MG tablet, Take 7.5 mg by mouth daily. (Patient not taking: Reported on 11/05/2019), Disp: , Rfl:    Allergies    Allergen Reactions  . Adhesive [Tape]     Rash/dermatitis     Social History   Tobacco Use  . Smoking status: Current Every Day Smoker    Packs/day: 0.50    Years: 15.00    Pack years: 7.50    Types: Cigarettes    Last attempt to quit: 10/24/2016    Years since quitting: 3.0  . Smokeless tobacco: Never Used  . Tobacco comment: smoking cessation information provided  Vaping Use  . Vaping Use: Never used  Substance Use Topics  . Alcohol use: Yes    Alcohol/week: 4.0 standard drinks    Types: 4 Standard drinks or equivalent per week    Comment: on occasion  . Drug use: No      Chart Review Today: I personally reviewed active problem list, medication list, allergies, family history, social history, health maintenance, notes from last encounter, lab results, imaging with the patient/caregiver today.   Review of Systems 10 Systems reviewed and are negative for acute change except as noted in the HPI.     Objective:   Vitals:   11/14/19 1154  BP: 112/68  Pulse: 72  Resp: 16  Temp: 98.3 F (36.8 C)  TempSrc: Oral  SpO2: 97%  Weight: 210 lb 9.6 oz (95.5 kg)  Height: 5\' 4"  (1.626 m)    Body mass index is 36.15 kg/m.  Physical  Exam Vitals and nursing note reviewed.  Constitutional:      General: She is not in acute distress.    Appearance: She is well-developed. She is obese. She is not ill-appearing, toxic-appearing or diaphoretic.  HENT:     Head: Normocephalic and atraumatic.     Right Ear: External ear normal.     Left Ear: External ear normal.  Eyes:     General: No scleral icterus.       Right eye: No discharge.        Left eye: No discharge.     Conjunctiva/sclera: Conjunctivae normal.  Cardiovascular:     Rate and Rhythm: Normal rate and regular rhythm.     Pulses: Normal pulses.     Heart sounds: Normal heart sounds. No murmur heard.  No friction rub. No gallop.   Pulmonary:     Effort: Pulmonary effort is normal. No respiratory distress.      Breath sounds: Normal breath sounds. No wheezing, rhonchi or rales.  Abdominal:     General: Bowel sounds are normal. There is no distension.     Palpations: Abdomen is soft. There is no hepatomegaly, splenomegaly or pulsatile mass.     Tenderness: There is abdominal tenderness in the right lower quadrant, suprapubic area and left lower quadrant. There is guarding. There is no right CVA tenderness, left CVA tenderness or rebound.  Skin:    General: Skin is warm and dry.     Coloration: Skin is not jaundiced or pale.  Neurological:     Mental Status: She is alert. Mental status is at baseline.  Psychiatric:        Mood and Affect: Mood normal.        Behavior: Behavior normal.      Results for orders placed or performed in visit on 10/08/19  COMPLETE METABOLIC PANEL WITH GFR  Result Value Ref Range   Glucose, Bld 91 65 - 99 mg/dL   BUN 10 7 - 25 mg/dL   Creat 0.79 0.50 - 0.99 mg/dL   GFR, Est Non African American 77 > OR = 60 mL/min/1.76m2   GFR, Est African American 89 > OR = 60 mL/min/1.26m2   BUN/Creatinine Ratio NOT APPLICABLE 6 - 22 (calc)   Sodium 138 135 - 146 mmol/L   Potassium 4.4 3.5 - 5.3 mmol/L   Chloride 102 98 - 110 mmol/L   CO2 28 20 - 32 mmol/L   Calcium 9.3 8.6 - 10.4 mg/dL   Total Protein 6.9 6.1 - 8.1 g/dL   Albumin 4.4 3.6 - 5.1 g/dL   Globulin 2.5 1.9 - 3.7 g/dL (calc)   AG Ratio 1.8 1.0 - 2.5 (calc)   Total Bilirubin 0.6 0.2 - 1.2 mg/dL   Alkaline phosphatase (APISO) 51 37 - 153 U/L   AST 16 10 - 35 U/L   ALT 11 6 - 29 U/L  CBC with Differential/Platelet  Result Value Ref Range   WBC 6.9 3.8 - 10.8 Thousand/uL   RBC 4.90 3.80 - 5.10 Million/uL   Hemoglobin 14.1 11.7 - 15.5 g/dL   HCT 43.2 35 - 45 %   MCV 88.2 80.0 - 100.0 fL   MCH 28.8 27.0 - 33.0 pg   MCHC 32.6 32.0 - 36.0 g/dL   RDW 13.2 11.0 - 15.0 %   Platelets 303 140 - 400 Thousand/uL   MPV 10.9 7.5 - 12.5 fL   Neutro Abs 4,030 1,500 - 7,800 cells/uL   Lymphs Abs 2,180 850 -  3,900 cells/uL    Absolute Monocytes 511 200 - 950 cells/uL   Eosinophils Absolute 117 15.0 - 500.0 cells/uL   Basophils Absolute 62 0.0 - 200.0 cells/uL   Neutrophils Relative % 58.4 %   Total Lymphocyte 31.6 %   Monocytes Relative 7.4 %   Eosinophils Relative 1.7 %   Basophils Relative 0.9 %  Hemoglobin A1c  Result Value Ref Range   Hgb A1c MFr Bld 5.5 <5.7 % of total Hgb   Mean Plasma Glucose 111 (calc)   eAG (mmol/L) 6.2 (calc)   Results for orders placed or performed in visit on 11/14/19  POCT urinalysis dipstick  Result Value Ref Range   Color, UA yellow    Clarity, UA clear    Glucose, UA Negative Negative   Bilirubin, UA negative    Ketones, UA negative    Spec Grav, UA 1.020 1.010 - 1.025   Blood, UA large    pH, UA 5.0 5.0 - 8.0   Protein, UA Positive (A) Negative   Urobilinogen, UA 0.2 0.2 or 1.0 E.U./dL   Nitrite, UA negative    Leukocytes, UA Trace (A) Negative   Appearance clear    Odor none        Assessment & Plan:   1. Lower abdominal pain Suspected diverticulitis onset 2-3 weeks ago, assessed and tx at [facility] walk in clinic, pt was feeling better with tx with augmentin at f/up appt on 10/18, however after finishing abx, over the last week she had gradual onset of worsening generalized lower abd pain, LLQ>RLQ, she had normal BM ~3 d ago, but then had severe pain - felt like severe menstrual cramps and pressure, with N/V several times Sunday, no BM since then, only a little mucus, she is passing gas, feels generally bloated, appetite is returning and she is tolerating solids/liquids for the past 2-3 days She is very tender on exam to RLQ, suprapubic area and LLQ with guarding, no rebound tenderness - suspect possibly colitis/diverticulitis vs UTI?  UA with bood and trace leukocytes, no nitrites and only a little bladder pressure with initiation of micturation, no urinary freq, urgency, hematuria, dysuria   Stat CT scan and labs ordered today  - COMPLETE METABOLIC PANEL  WITH GFR - CBC with Differential/Platelet - POCT urinalysis dipstick - CT Abdomen Pelvis Wo Contrast - Urine Culture  2. Non-intractable vomiting with nausea, unspecified vomiting type Improving since the weekend, continue to progress diet slowly - CT Abdomen Pelvis Wo Contrast  3. Hematuria, unspecified type Pain to suprapubic area, neg nitrites, no CVA tenderness, no urinary complaints per pt, culture added - Urine Culture    Greater than 50% of this visit was spent in direct face-to-face counseling, obtaining history and physical, discussing and educating pt on treatment plan, arranging stat CT imaging and following up with results and plan with patient today.  Total time of this visit was 45 min +.  Remainder of time involved but was not limited to reviewing chart (recent and pertinent OV notes and labs), documentation in EMR, and coordinating care and treatment plan.    Delsa Grana, PA-C 11/14/19 12:02 PM

## 2019-11-15 LAB — CBC WITH DIFFERENTIAL/PLATELET
Absolute Monocytes: 762 cells/uL (ref 200–950)
Basophils Absolute: 73 cells/uL (ref 0–200)
Basophils Relative: 0.6 %
Eosinophils Absolute: 182 cells/uL (ref 15–500)
Eosinophils Relative: 1.5 %
HCT: 40.3 % (ref 35.0–45.0)
Hemoglobin: 13.5 g/dL (ref 11.7–15.5)
Lymphs Abs: 2880 cells/uL (ref 850–3900)
MCH: 29 pg (ref 27.0–33.0)
MCHC: 33.5 g/dL (ref 32.0–36.0)
MCV: 86.7 fL (ref 80.0–100.0)
MPV: 11 fL (ref 7.5–12.5)
Monocytes Relative: 6.3 %
Neutro Abs: 8204 cells/uL — ABNORMAL HIGH (ref 1500–7800)
Neutrophils Relative %: 67.8 %
Platelets: 322 10*3/uL (ref 140–400)
RBC: 4.65 10*6/uL (ref 3.80–5.10)
RDW: 12.6 % (ref 11.0–15.0)
Total Lymphocyte: 23.8 %
WBC: 12.1 10*3/uL — ABNORMAL HIGH (ref 3.8–10.8)

## 2019-11-15 LAB — COMPLETE METABOLIC PANEL WITH GFR
AG Ratio: 1.4 (calc) (ref 1.0–2.5)
ALT: 17 U/L (ref 6–29)
AST: 21 U/L (ref 10–35)
Albumin: 4.1 g/dL (ref 3.6–5.1)
Alkaline phosphatase (APISO): 70 U/L (ref 37–153)
BUN: 12 mg/dL (ref 7–25)
CO2: 30 mmol/L (ref 20–32)
Calcium: 9.5 mg/dL (ref 8.6–10.4)
Chloride: 98 mmol/L (ref 98–110)
Creat: 0.89 mg/dL (ref 0.50–0.99)
GFR, Est African American: 77 mL/min/{1.73_m2} (ref >=60)
GFR, Est Non African American: 67 mL/min/{1.73_m2} (ref >=60)
Globulin: 2.9 g/dL (calc) (ref 1.9–3.7)
Glucose, Bld: 88 mg/dL (ref 65–99)
Potassium: 4.5 mmol/L (ref 3.5–5.3)
Sodium: 137 mmol/L (ref 135–146)
Total Bilirubin: 0.6 mg/dL (ref 0.2–1.2)
Total Protein: 7 g/dL (ref 6.1–8.1)

## 2019-11-15 LAB — URINE CULTURE
MICRO NUMBER:: 11127405
Result:: NO GROWTH
SPECIMEN QUALITY:: ADEQUATE

## 2019-11-26 ENCOUNTER — Encounter: Payer: Self-pay | Admitting: Family Medicine

## 2019-11-30 NOTE — Telephone Encounter (Signed)
resolved 

## 2020-03-07 ENCOUNTER — Ambulatory Visit
Admission: RE | Admit: 2020-03-07 | Discharge: 2020-03-07 | Disposition: A | Discharge status: Home / Self Care | Payer: Medicare Other | Source: Ambulatory Visit | Attending: Family Medicine | Admitting: Family Medicine

## 2020-03-07 ENCOUNTER — Other Ambulatory Visit: Payer: Self-pay

## 2020-03-07 DIAGNOSIS — Z1231 Encounter for screening mammogram for malignant neoplasm of breast: Secondary | ICD-10-CM | POA: Diagnosis not present

## 2020-04-07 ENCOUNTER — Encounter: Payer: Self-pay | Admitting: Family Medicine

## 2020-04-07 ENCOUNTER — Telehealth (INDEPENDENT_AMBULATORY_CARE_PROVIDER_SITE_OTHER): Payer: Medicare Other | Admitting: Family Medicine

## 2020-04-07 VITALS — BP 126/87 | HR 87 | Ht 66.0 in | Wt 208.0 lb

## 2020-04-07 DIAGNOSIS — Z23 Encounter for immunization: Secondary | ICD-10-CM

## 2020-04-07 DIAGNOSIS — I10 Essential (primary) hypertension: Secondary | ICD-10-CM

## 2020-04-07 DIAGNOSIS — E669 Obesity, unspecified: Secondary | ICD-10-CM

## 2020-04-07 DIAGNOSIS — R7303 Prediabetes: Secondary | ICD-10-CM | POA: Diagnosis not present

## 2020-04-07 DIAGNOSIS — M546 Pain in thoracic spine: Secondary | ICD-10-CM

## 2020-04-07 DIAGNOSIS — I700 Atherosclerosis of aorta: Secondary | ICD-10-CM

## 2020-04-07 DIAGNOSIS — Z720 Tobacco use: Secondary | ICD-10-CM | POA: Diagnosis not present

## 2020-04-07 DIAGNOSIS — M8580 Other specified disorders of bone density and structure, unspecified site: Secondary | ICD-10-CM

## 2020-04-07 DIAGNOSIS — Z5181 Encounter for therapeutic drug level monitoring: Secondary | ICD-10-CM

## 2020-04-07 MED ORDER — ZOSTER VAC RECOMB ADJUVANTED 50 MCG/0.5ML IM SUSR
0.5000 mL | Freq: Once | INTRAMUSCULAR | 1 refills | Status: AC
Start: 2020-04-07 — End: 2020-04-07

## 2020-04-07 MED ORDER — DICLOFENAC SODIUM 1 % EX GEL: 2.0000 g | Freq: Four times a day (QID) | CUTANEOUS | 5 refills | Status: DC | PRN

## 2020-04-07 NOTE — Progress Notes (Addendum)
Name: Allison Phillips   MRN: [id number]    DOB: 08-15-1950   Date:04/07/2020       Progress Note  Subjective:    I connected with  Allison Phillips  on 04/07/20 at 10:00 AM EDT by a video enabled telemedicine application and verified that I am speaking with the correct person using two identifiers.  I discussed the limitations of evaluation and management by telemedicine and the availability of in person appointments. The patient expressed understanding and agreed to proceed. Staff also discussed with the patient that there may be a patient responsible charge related to this service. Patient Location: [facility] Provider Location: [facility] Additional Individuals present: none  Chief Complaint  Patient presents with  . Follow-up    Allison Phillips is a 70 y.o. female, presents for virtual visit for routine follow up on the conditions listed above.  Interested in shingrix  Hx of MDD - mild Depression screen [facility] 2/9 04/07/2020 11/14/2019 11/05/2019 10/08/2019 07/17/2019  Decreased Interest 0 0 0 0 0  Down, Depressed, Hopeless 0 0 0 0 0  PHQ - 2 Score 0 0 0 0 0  Altered sleeping - - - 0 -  Tired, decreased energy - - - 0 -  Change in appetite - - - 0 -  Feeling bad or failure about yourself  - - - 0 -  Trouble concentrating - - - 0 -  Moving slowly or fidgety/restless - - - 0 -  Suicidal thoughts - - - 0 -  PHQ-9 Score - - - 0 -  Difficult doing work/chores - - - Not difficult at all -  Some recent data might be hidden   Hypertension:  Currently managed with diet/lifestyle no current meds Blood pressure today is well controlled. BP Readings from Last 3 Encounters:  04/07/20 126/87  11/14/19 112/68  11/05/19 132/76   Pt denies CP, SOB, exertional sx, LE edema, palpitation, Ha's, visual disturbances, lightheadedness, hypotension, syncope.  Weight stable Wt Readings from Last 5 Encounters:  04/07/20 208 lb (94.3 kg)  11/14/19 210 lb 9.6 oz (95.5 kg)  11/05/19 210 lb 1.6 oz  (95.3 kg)  10/08/19 207 lb 11.2 oz (94.2 kg)  07/17/19 208 lb (94.3 kg)   BMI Readings from Last 5 Encounters:  04/07/20 33.57 kg/m  11/14/19 36.15 kg/m  11/05/19 36.06 kg/m  10/08/19 35.65 kg/m  07/17/19 35.70 kg/m    Abdominal issues/pain no change or worsening Hx of liver abscess and diverticulitis, BMs normal, no changes, blood in stool  Hx of aortic atherosclerosis not on statin - found on imaging Lab Results  Component Value Date   CHOL 156 08/28/2018   HDL 67 08/28/2018   LDLCALC 68 08/28/2018   TRIG 120 08/28/2018   CHOLHDL 2.3 08/28/2018   The 10-year ASCVD risk score Mikey Bussing DC Jr., et al., 2013) is: 34.5%   Values used to calculate the score:     Age: 74 years     Sex: Female     Is Non-Hispanic African American: Yes     Diabetic: Yes     Tobacco smoker: Yes     Systolic Blood Pressure: 734 mmHg     Is BP treated: No     HDL Cholesterol: 67 mg/dL     Total Cholesterol: 156 mg/dL On omega 3 fatty acid supplements   Patient Active Problem List   Diagnosis Date Noted  . Aortic atherosclerosis ([place]) 11/14/2019  . Obesity (BMI 35.0-39.9 without comorbidity) 01/06/2018  .  Osteopenia 04/20/2017  . Essential hypertension, benign 03/08/2016    Current Outpatient Medications:  .  Cyanocobalamin (B-12) 500 MCG TABS, Take 2,500 mcg by mouth daily. , Disp: , Rfl:  .  diclofenac Sodium (VOLTAREN) 1 % GEL, Apply 2 g topically 4 (four) times daily as needed (for chronic pain management)., Disp: 100 g, Rfl: 5 .  Gluc-Chonn-MSM-Boswellia-Vit D (GLUCOSAMINE CHONDROITIN + D3 PO), Take by mouth., Disp: , Rfl:  .  Multiple Vitamin (MULTIVITAMIN) tablet, Take 1 tablet by mouth daily. Centrum Silver, Disp: , Rfl:  .  Omega-3 Fatty Acids (FISH OIL PO), Take 1,200 mg daily by mouth. , Disp: , Rfl:  .  meloxicam (MOBIC) 7.5 MG tablet, Take 7.5 mg by mouth daily. (Patient not taking: No sig reported), Disp: , Rfl:  .  ondansetron (ZOFRAN ODT) 4 MG disintegrating tablet, Take  1-2 tablets (4-8 mg total) by mouth every 8 (eight) hours as needed for nausea or vomiting. (Patient not taking: Reported on 04/07/2020), Disp: 20 tablet, Rfl: 1 Allergies  Allergen Reactions  . Adhesive [Tape]     Rash/dermatitis    Past Surgical History:  Procedure Laterality Date  . ANAL FISSURE REPAIR    . APPENDECTOMY    . [place]  . COLONOSCOPY WITH PROPOFOL N/A 12/18/2014   Procedure: COLONOSCOPY WITH PROPOFOL;  Surgeon: Christene Lye, MD;  Location: [facility] ENDOSCOPY;  Service: Endoscopy;  Laterality: N/A;  . COLONOSCOPY WITH PROPOFOL N/A 02/17/2018   Procedure: COLONOSCOPY WITH PROPOFOL;  Surgeon: Virgel Manifold, MD;  Location: [facility] ENDOSCOPY;  Service: Endoscopy;  Laterality: N/A;  . LIPOMA EXCISION  07/2015   right shoulder  . LIPOMA EXCISION  11/19/2015  . OOPHORECTOMY     for "fibroids" not sure which ovary   Family History  Problem Relation Age of Onset  . Hypertension Mother   . Kidney disease Mother   . Alzheimer's disease Mother   . Thyroid cancer Son   . Lung cancer Brother   . Rectal cancer Brother   . Breast cancer Neg Hx    Social History   Socioeconomic History  . Marital status: Married    Spouse name: Nadara Mustard  . Number of children: 1  . Years of education: Not on file  . Highest education level: Bachelor's degree (e.g., BA, AB, BS)  Occupational History  . Occupation: Retired  Tobacco Use  . Smoking status: Current Every Day Smoker    Packs/day: 0.50    Years: 15.00    Pack years: 7.50    Types: Cigarettes    Last attempt to quit: 10/24/2016    Years since quitting: 3.4  . Smokeless tobacco: Never Used  . Tobacco comment: smoking cessation information provided  Vaping Use  . Vaping Use: Never used  Substance and Sexual Activity  . Alcohol use: Yes    Alcohol/week: 4.0 standard drinks    Types: 4 Standard drinks or equivalent per week    Comment: on occasion  . Drug use: No  . Sexual activity: Yes    Partners:  Male  Other Topics Concern  . Not on file  Social History Narrative  . Not on file   Social Determinants of Health   Financial Resource Strain: Low Risk   . Difficulty of Paying Living Expenses: Not hard at all  Food Insecurity: No Food Insecurity  . Worried About Charity fundraiser in the Last Year: Never true  . Ran Out of Food in the Last Year: Never  true  Transportation Needs: No Transportation Needs  . Lack of Transportation (Medical): No  . Lack of Transportation (Non-Medical): No  Physical Activity: Inactive  . Days of Exercise per Week: 0 days  . Minutes of Exercise per Session: 0 min  Stress: No Stress Concern Present  . Feeling of Stress : Not at all  Social Connections: Socially Integrated  . Frequency of Communication with Friends and Family: More than three times a week  . Frequency of Social Gatherings with Friends and Family: Twice a week  . Attends Religious Services: More than 4 times per year  . Active Member of Clubs or Organizations: Yes  . Attends Archivist Meetings: More than 4 times per year  . Marital Status: Married  Human resources officer Violence: Not At Risk  . Fear of Current or Ex-Partner: No  . Emotionally Abused: No  . Physically Abused: No  . Sexually Abused: No    Chart Review Today: I personally reviewed active problem list, medication list, allergies, family history, social history, health maintenance, notes from last encounter, lab results, imaging with the patient/caregiver today.   Review of Systems  10 Systems reviewed and are negative for acute change except as noted in the HPI.   Objective:    Virtual encounter, vitals limited, only able to obtain the following Today's Vitals   04/07/20 1034  BP: 126/87  Pulse: 87  Weight: 208 lb (94.3 kg)  Height: 5\' 6"  (1.676 m)   Body mass index is 33.57 kg/m. Nursing Note and Vital Signs reviewed.  Physical Exam Vitals and nursing note reviewed.  Constitutional:       General: She is not in acute distress.    Appearance: She is not ill-appearing, toxic-appearing or diaphoretic.  Pulmonary:     Effort: No respiratory distress.  Neurological:     Mental Status: She is alert. Mental status is at baseline.  Psychiatric:        Mood and Affect: Mood normal.        Behavior: Behavior normal.     PE limited by virtual encounter  No results found for this or any previous visit (from the past 72 hour(s)).  PHQ2/9: Depression screen [facility] 2/9 04/07/2020 11/14/2019 11/05/2019 10/08/2019 07/17/2019  Decreased Interest 0 0 0 0 0  Down, Depressed, Hopeless 0 0 0 0 0  PHQ - 2 Score 0 0 0 0 0  Altered sleeping - - - 0 -  Tired, decreased energy - - - 0 -  Change in appetite - - - 0 -  Feeling bad or failure about yourself  - - - 0 -  Trouble concentrating - - - 0 -  Moving slowly or fidgety/restless - - - 0 -  Suicidal thoughts - - - 0 -  PHQ-9 Score - - - 0 -  Difficult doing work/chores - - - Not difficult at all -  Some recent data might be hidden   PHQ-2/9 Result is neg, reviewed  Fall Risk: Fall Risk  04/07/2020 11/14/2019 11/05/2019 10/08/2019 07/17/2019  Falls in the past year? 0 0 0 0 0  Number falls in past yr: 0 0 0 0 0  Injury with Fall? 0 0 0 0 0  Risk for fall due to : - - - - No Fall Risks  Follow up - Falls evaluation completed Falls evaluation completed - Falls prevention discussed     Assessment and Plan:     ICD-10-CM   1. Essential hypertension, benign  W10 COMPLETE METABOLIC PANEL WITH GFR  2. Prediabetes  X32.35 COMPLETE METABOLIC PANEL WITH GFR    Hemoglobin A1c  3. Aortic atherosclerosis (HCC)  T73.2 COMPLETE METABOLIC PANEL WITH GFR    Lipid panel  4. Tobacco abuse  Z72.0 CBC with Differential/Platelet    COMPLETE METABOLIC PANEL WITH GFR    Lipid panel    Hemoglobin A1c  5. Osteopenia, unspecified location  K02.54 COMPLETE METABOLIC PANEL WITH GFR  6. Obesity (BMI 35.0-39.9 without comorbidity)  E66.9 CBC with  Differential/Platelet    COMPLETE METABOLIC PANEL WITH GFR    Hemoglobin A1c  7. Need for shingles vaccine  Z23 Zoster Vaccine Adjuvanted [facility]) injection  8. Encounter for medication monitoring  Z51.81 CBC with Differential/Platelet    COMPLETE METABOLIC PANEL WITH GFR    Lipid panel    Hemoglobin A1c  9. Acute right-sided thoracic back pain  M54.6 diclofenac Sodium (VOLTAREN) 1 % GEL   Would benefit from statin with ASCVD risk score - repeat labs and review with pt - likely to recommend trying statin  I discussed the assessment and treatment plan with the patient. The patient was provided an opportunity to ask questions and all were answered. The patient agreed with the plan and demonstrated an understanding of the instructions.  The patient was advised to call back or seek an in-person evaluation if the symptoms worsen or if the condition fails to improve as anticipated.  I provided 20+ minutes of non-face-to-face time during this encounter.  Delsa Grana, PA-C 04/07/20 10:21 AM

## 2020-05-12 ENCOUNTER — Encounter: Payer: Self-pay | Admitting: Family Medicine

## 2020-05-12 DIAGNOSIS — G4733 Obstructive sleep apnea (adult) (pediatric): Secondary | ICD-10-CM

## 2020-06-06 ENCOUNTER — Ambulatory Visit: Payer: Medicare Other | Admitting: Family Medicine

## 2020-06-10 ENCOUNTER — Other Ambulatory Visit: Payer: Self-pay

## 2020-06-10 ENCOUNTER — Ambulatory Visit (INDEPENDENT_AMBULATORY_CARE_PROVIDER_SITE_OTHER): Payer: Medicare Other | Admitting: Unknown Physician Specialty

## 2020-06-10 ENCOUNTER — Encounter: Payer: Self-pay | Admitting: Unknown Physician Specialty

## 2020-06-10 VITALS — BP 124/78 | HR 77 | Temp 98.1°F | Resp 16 | Ht 66.0 in | Wt 213.1 lb

## 2020-06-10 DIAGNOSIS — M654 Radial styloid tenosynovitis [de Quervain]: Secondary | ICD-10-CM

## 2020-06-10 NOTE — Patient Instructions (Signed)
Thumb Spica splint  De Quervain's Tenosynovitis  De Quervain's tenosynovitis is a condition that causes inflammation of the tendon on the thumb side of the wrist. Tendons are cords of tissue that connect bones to muscles. The tendons in the hand pass through a tunnel called a sheath. A slippery layer of tissue (synovium) lets the tendons move smoothly in the sheath. With de Quervain's tenosynovitis, the sheath swells or thickens, causing friction and pain. The condition is also called de Quervain's disease and de Quervain's syndrome. It occurs most often in women who are 69-69 years old. What are the causes? The exact cause of this condition is not known. It may be associated with overuse of the hand and wrist. What increases the risk? You are more likely to develop this condition if you:  Use your hands far more than normal, especially if you repeat certain movements that involve twisting your hand or using a tight grip.  Are pregnant.  Are a middle-aged woman.  Have rheumatoid arthritis.  Have diabetes. What are the signs or symptoms? The main symptom of this condition is pain on the thumb side of the wrist. The pain may get worse when you grasp something or turn your wrist. Other symptoms may include:  Pain that extends up the forearm.  Swelling of your wrist and hand.  Trouble moving the thumb and wrist.  A sensation of snapping in the wrist.  A bump filled with fluid (cyst) in the area of the pain. How is this diagnosed? This condition may be diagnosed based on:  Your symptoms and medical history.  A physical exam. During the exam, your health care provider may do a simple test Wynn Maudlin test) that involves pulling your thumb and wrist to see if this causes pain. You may also need to have an X-ray or ultrasound. How is this treated? Treatment for this condition may include:  Avoiding any activity that causes pain and swelling.  Taking medicines. Anti-inflammatory  medicines and corticosteroid injections may be used to reduce inflammation and relieve pain.  Wearing a splint.  Having surgery. This may be needed if other treatments do not work. Once the pain and swelling have gone down, you may start:  Physical therapy. This includes exercises to improve movement and strength in your wrist and thumb.  Occupational therapy. This includes adjusting how you move your wrist. Follow these instructions at home: If you have a splint:  Wear the splint as told by your health care provider. Remove it only as told by your health care provider.  Loosen the splint if your fingers tingle, become numb, or turn cold and blue.  Keep the splint clean.  If the splint is not waterproof: ? Do not let it get wet. ? Cover it with a watertight covering when you take a bath or a shower. Managing pain, stiffness, and swelling  Avoid movements and activities that cause pain and swelling in the wrist area.  If directed, put ice on the painful area. This may be helpful after doing activities that involve the sore wrist. To do this: ? Put ice in a plastic bag. ? Place a towel between your skin and the bag. ? Leave the ice on for 20 minutes, 2-3 times a day. ? Remove the ice if your skin turns bright red. This is very important. If you cannot feel pain, heat, or cold, you have a greater risk of damage to the area.  Move your fingers often to reduce stiffness and swelling.  Raise (elevate) the injured area above the level of your heart while you are sitting or lying down.   General instructions  Return to your normal activities as told by your health care provider. Ask your health care provider what activities are safe for you.  Take over-the-counter and prescription medicines only as told by your health care provider.  Keep all follow-up visits. This is important. Contact a health care provider if:  Your pain medicine does not help.  Your pain gets worse.  You  develop new symptoms. Summary  De Quervain's tenosynovitis is a condition that causes inflammation of the tendon on the thumb side of the wrist.  The condition occurs most often in women who are 69-69 years old.  The exact cause of this condition is not known. It may be associated with overuse of the hand and wrist.  Treatment starts with avoiding activity that causes pain or swelling in the wrist area. Other treatments may include wearing a splint and taking medicine. Sometimes, surgery is needed. This information is not intended to replace advice given to you by your health care provider. Make sure you discuss any questions you have with your health care provider. Document Revised: 04/18/2019 Document Reviewed: 04/18/2019 Elsevier Patient Education  2021 [place] Tenosynovitis  De Quervain's tenosynovitis is a condition that causes inflammation of the tendon on the thumb side of the wrist. Tendons are cords of tissue that connect bones to muscles. The tendons in the hand pass through a tunnel called a sheath. A slippery layer of tissue (synovium) lets the tendons move smoothly in the sheath. With de Quervain's tenosynovitis, the sheath swells or thickens, causing friction and pain. The condition is also called de Quervain's disease and de Quervain's syndrome. It occurs most often in women who are 69-69 years old. What are the causes? The exact cause of this condition is not known. It may be associated with overuse of the hand and wrist. What increases the risk? You are more likely to develop this condition if you:  Use your hands far more than normal, especially if you repeat certain movements that involve twisting your hand or using a tight grip.  Are pregnant.  Are a middle-aged woman.  Have rheumatoid arthritis.  Have diabetes. What are the signs or symptoms? The main symptom of this condition is pain on the thumb side of the wrist. The pain may get worse  when you grasp something or turn your wrist. Other symptoms may include:  Pain that extends up the forearm.  Swelling of your wrist and hand.  Trouble moving the thumb and wrist.  A sensation of snapping in the wrist.  A bump filled with fluid (cyst) in the area of the pain. How is this diagnosed? This condition may be diagnosed based on:  Your symptoms and medical history.  A physical exam. During the exam, your health care provider may do a simple test Wynn Maudlin test) that involves pulling your thumb and wrist to see if this causes pain. You may also need to have an X-ray or ultrasound. How is this treated? Treatment for this condition may include:  Avoiding any activity that causes pain and swelling.  Taking medicines. Anti-inflammatory medicines and corticosteroid injections may be used to reduce inflammation and relieve pain.  Wearing a splint.  Having surgery. This may be needed if other treatments do not work. Once the pain and swelling have gone down, you may start:  Physical therapy. This includes exercises  to improve movement and strength in your wrist and thumb.  Occupational therapy. This includes adjusting how you move your wrist. Follow these instructions at home: If you have a splint:  Wear the splint as told by your health care provider. Remove it only as told by your health care provider.  Loosen the splint if your fingers tingle, become numb, or turn cold and blue.  Keep the splint clean.  If the splint is not waterproof: ? Do not let it get wet. ? Cover it with a watertight covering when you take a bath or a shower. Managing pain, stiffness, and swelling  Avoid movements and activities that cause pain and swelling in the wrist area.  If directed, put ice on the painful area. This may be helpful after doing activities that involve the sore wrist. To do this: ? Put ice in a plastic bag. ? Place a towel between your skin and the bag. ? Leave the  ice on for 20 minutes, 2-3 times a day. ? Remove the ice if your skin turns bright red. This is very important. If you cannot feel pain, heat, or cold, you have a greater risk of damage to the area.  Move your fingers often to reduce stiffness and swelling.  Raise (elevate) the injured area above the level of your heart while you are sitting or lying down.   General instructions  Return to your normal activities as told by your health care provider. Ask your health care provider what activities are safe for you.  Take over-the-counter and prescription medicines only as told by your health care provider.  Keep all follow-up visits. This is important. Contact a health care provider if:  Your pain medicine does not help.  Your pain gets worse.  You develop new symptoms. Summary  De Quervain's tenosynovitis is a condition that causes inflammation of the tendon on the thumb side of the wrist.  The condition occurs most often in women who are 67-32 years old.  The exact cause of this condition is not known. It may be associated with overuse of the hand and wrist.  Treatment starts with avoiding activity that causes pain or swelling in the wrist area. Other treatments may include wearing a splint and taking medicine. Sometimes, surgery is needed. This information is not intended to replace advice given to you by your health care provider. Make sure you discuss any questions you have with your health care provider. Document Revised: 04/18/2019 Document Reviewed: 04/18/2019 Elsevier Patient Education  2021 [employer].

## 2020-06-10 NOTE — Progress Notes (Signed)
BP 124/78   Pulse 77   Temp 98.1 F (36.7 C) (Oral)   Resp 16   Ht 5\' 6"  (1.676 m)   Wt 213 lb 1.6 oz (96.7 kg)   LMP  (LMP Unknown) Comment: rt ovary removed  SpO2 98%   BMI 34.40 kg/m    Subjective:    Patient ID: Allison Phillips, female    DOB: 03-12-1950, 70 y.o.   MRN: [id number]  HPI: Allison Phillips is a 70 y.o. female  Chief Complaint  Patient presents with  . Hand Pain    Thumb on left hand, painful, throbbing, warm to the touch   Hand Pain  Incident onset: several months. The injury mechanism was repetitive motion. Pain location: left thumb. The quality of the pain is described as aching. The pain does not radiate. The patient is experiencing no pain. The pain has been fluctuating since the incident. Pertinent negatives include no chest pain, muscle weakness, numbness or tingling. Nothing aggravates the symptoms. She has tried rest (Voltaren gel) for the symptoms.    Relevant past medical, surgical, family and social history reviewed and updated as indicated. Interim medical history since our last visit reviewed. Allergies and medications reviewed and updated.  Review of Systems  Cardiovascular: Negative for chest pain.  Neurological: Negative for tingling and numbness.    Per HPI unless specifically indicated above     Objective:    BP 124/78   Pulse 77   Temp 98.1 F (36.7 C) (Oral)   Resp 16   Ht 5\' 6"  (1.676 m)   Wt 213 lb 1.6 oz (96.7 kg)   LMP  (LMP Unknown) Comment: rt ovary removed  SpO2 98%   BMI 34.40 kg/m   Wt Readings from Last 3 Encounters:  06/10/20 213 lb 1.6 oz (96.7 kg)  04/07/20 208 lb (94.3 kg)  11/14/19 210 lb 9.6 oz (95.5 kg)    Physical Exam Constitutional:      General: She is not in acute distress.    Appearance: Normal appearance. She is well-developed.  HENT:     Head: Normocephalic and atraumatic.  Eyes:     General: Lids are normal. No scleral icterus.       Right eye: No discharge.        Left eye: No  discharge.     Conjunctiva/sclera: Conjunctivae normal.  Cardiovascular:     Rate and Rhythm: Normal rate.  Pulmonary:     Effort: Pulmonary effort is normal.  Abdominal:     Palpations: There is no hepatomegaly or splenomegaly.  Musculoskeletal:        General: Normal range of motion.     Right hand: Normal.     Left hand: Normal.     Comments: Positive Quervan's sign  Skin:    Coloration: Skin is not pale.     Findings: No rash.  Neurological:     Mental Status: She is alert and oriented to person, place, and time.  Psychiatric:        Behavior: Behavior normal.        Thought Content: Thought content normal.        Judgment: Judgment normal.     Results for orders placed or performed in visit on 11/14/19  Urine Culture   Specimen: Urine  Result Value Ref Range   MICRO NUMBER: [id number]    SPECIMEN QUALITY: Adequate    Sample Source URINE    STATUS: FINAL    Result: No  Growth   COMPLETE METABOLIC PANEL WITH GFR  Result Value Ref Range   Glucose, Bld 88 65 - 99 mg/dL   BUN 12 7 - 25 mg/dL   Creat 0.89 0.50 - 0.99 mg/dL   GFR, Est Non African American 67 > OR = 60 mL/min/1.75m2   GFR, Est African American 77 > OR = 60 mL/min/1.78m2   BUN/Creatinine Ratio NOT APPLICABLE 6 - 22 (calc)   Sodium 137 135 - 146 mmol/L   Potassium 4.5 3.5 - 5.3 mmol/L   Chloride 98 98 - 110 mmol/L   CO2 30 20 - 32 mmol/L   Calcium 9.5 8.6 - 10.4 mg/dL   Total Protein 7.0 6.1 - 8.1 g/dL   Albumin 4.1 3.6 - 5.1 g/dL   Globulin 2.9 1.9 - 3.7 g/dL (calc)   AG Ratio 1.4 1.0 - 2.5 (calc)   Total Bilirubin 0.6 0.2 - 1.2 mg/dL   Alkaline phosphatase (APISO) 70 37 - 153 U/L   AST 21 10 - 35 U/L   ALT 17 6 - 29 U/L  CBC with Differential/Platelet  Result Value Ref Range   WBC 12.1 (H) 3.8 - 10.8 Thousand/uL   RBC 4.65 3.80 - 5.10 Million/uL   Hemoglobin 13.5 11.7 - 15.5 g/dL   HCT 40.3 35.0 - 45.0 %   MCV 86.7 80.0 - 100.0 fL   MCH 29.0 27.0 - 33.0 pg   MCHC 33.5 32.0 - 36.0 g/dL   RDW  12.6 11.0 - 15.0 %   Platelets 322 140 - 400 Thousand/uL   MPV 11.0 7.5 - 12.5 fL   Neutro Abs 8,204 (H) 1,500 - 7,800 cells/uL   Lymphs Abs 2,880 850 - 3,900 cells/uL   Absolute Monocytes 762 200 - 950 cells/uL   Eosinophils Absolute 182 15 - 500 cells/uL   Basophils Absolute 73 0 - 200 cells/uL   Neutrophils Relative % 67.8 %   Total Lymphocyte 23.8 %   Monocytes Relative 6.3 %   Eosinophils Relative 1.5 %   Basophils Relative 0.6 %  POCT urinalysis dipstick  Result Value Ref Range   Color, UA yellow    Clarity, UA clear    Glucose, UA Negative Negative   Bilirubin, UA negative    Ketones, UA negative    Spec Grav, UA 1.020 1.010 - 1.025   Blood, UA large    pH, UA 5.0 5.0 - 8.0   Protein, UA Positive (A) Negative   Urobilinogen, UA 0.2 0.2 or 1.0 E.U./dL   Nitrite, UA negative    Leukocytes, UA Trace (A) Negative   Appearance clear    Odor none       Assessment & Plan:   Problem List Items Addressed This Visit   None   Visit Diagnoses    Tenosynovitis, de Quervain    -  Primary   Recommended a thumb spica splint in combination with Voltaren gel she has an rx for.  Recheck in 2 weeks       Follow up plan: Return in about 2 weeks (around 06/24/2020).

## 2020-06-17 ENCOUNTER — Telehealth: Payer: Self-pay

## 2020-06-17 NOTE — Telephone Encounter (Signed)
Dates put on immunization record

## 2020-06-17 NOTE — Telephone Encounter (Signed)
Copied from [place] [id number]. Topic: General - Other >> Jun 13, 2020  2:56 PM Erick Blinks wrote: Reason for CRM:  Pt called to report her boosters for [facility]  11/13/2019 04/28/2020

## 2020-06-24 ENCOUNTER — Encounter: Payer: Self-pay | Admitting: Unknown Physician Specialty

## 2020-06-24 ENCOUNTER — Other Ambulatory Visit: Payer: Self-pay

## 2020-06-24 ENCOUNTER — Telehealth (INDEPENDENT_AMBULATORY_CARE_PROVIDER_SITE_OTHER): Payer: Medicare Other | Admitting: Unknown Physician Specialty

## 2020-06-24 DIAGNOSIS — M654 Radial styloid tenosynovitis [de Quervain]: Secondary | ICD-10-CM

## 2020-06-24 NOTE — Progress Notes (Signed)
LMP  (LMP Unknown) Comment: rt ovary removed   Subjective:    Patient ID: Allison Phillips, female    DOB: 10/20/1950, 70 y.o.   MRN: [id number]  HPI: Allison Phillips is a 70 y.o. female  Chief Complaint  Patient presents with  . Follow-up   This visit was completed via telephone due to the restrictions of the COVID-19 pandemic. All issues as above were discussed and addressed but no physical exam was performed. If it was felt that the patient should be evaluated in the office, they were directed there. The patient verbally consented to this visit. Patient was unable to complete an audio/visual visit due to Technical difficulties. . Location of the patient: home . Location of the provider: home . Those involved with this call:  . Provider: [name] FNP . CMA: Hollie Salk . Time spent on call: 10 minutes on the phone discussing health concerns. 5 minutes total spent in review of patient's record and preparation of their chart.  I verified patient identity using two factors (patient name and date of birth). Patient consents verbally to being seen via telemedicine visit today.   Relevant past medical, surgical, family and social history reviewed and updated as indicated. Interim medical history since our last visit reviewed. Allergies and medications reviewed and updated.  F/u visit for De Quervain's tendonitis.  It is improving with the thumb spica splint and Voltaren gel    Per HPI unless specifically indicated above     Objective:    LMP  (LMP Unknown) Comment: rt ovary removed  Wt Readings from Last 3 Encounters:  06/10/20 213 lb 1.6 oz (96.7 kg)  04/07/20 208 lb (94.3 kg)  11/14/19 210 lb 9.6 oz (95.5 kg)    Physical Exam Neurological:     Mental Status: She is alert.  Psychiatric:        Mood and Affect: Mood normal.     Results for orders placed or performed in visit on 11/14/19  Urine Culture   Specimen: Urine  Result Value Ref Range   MICRO NUMBER:  [id number]    SPECIMEN QUALITY: Adequate    Sample Source URINE    STATUS: FINAL    Result: No Growth   COMPLETE METABOLIC PANEL WITH GFR  Result Value Ref Range   Glucose, Bld 88 65 - 99 mg/dL   BUN 12 7 - 25 mg/dL   Creat 0.89 0.50 - 0.99 mg/dL   GFR, Est Non African American 67 > OR = 60 mL/min/1.96m2   GFR, Est African American 77 > OR = 60 mL/min/1.26m2   BUN/Creatinine Ratio NOT APPLICABLE 6 - 22 (calc)   Sodium 137 135 - 146 mmol/L   Potassium 4.5 3.5 - 5.3 mmol/L   Chloride 98 98 - 110 mmol/L   CO2 30 20 - 32 mmol/L   Calcium 9.5 8.6 - 10.4 mg/dL   Total Protein 7.0 6.1 - 8.1 g/dL   Albumin 4.1 3.6 - 5.1 g/dL   Globulin 2.9 1.9 - 3.7 g/dL (calc)   AG Ratio 1.4 1.0 - 2.5 (calc)   Total Bilirubin 0.6 0.2 - 1.2 mg/dL   Alkaline phosphatase (APISO) 70 37 - 153 U/L   AST 21 10 - 35 U/L   ALT 17 6 - 29 U/L  CBC with Differential/Platelet  Result Value Ref Range   WBC 12.1 (H) 3.8 - 10.8 Thousand/uL   RBC 4.65 3.80 - 5.10 Million/uL   Hemoglobin 13.5 11.7 - 15.5 g/dL  HCT 40.3 35.0 - 45.0 %   MCV 86.7 80.0 - 100.0 fL   MCH 29.0 27.0 - 33.0 pg   MCHC 33.5 32.0 - 36.0 g/dL   RDW 12.6 11.0 - 15.0 %   Platelets 322 140 - 400 Thousand/uL   MPV 11.0 7.5 - 12.5 fL   Neutro Abs 8,204 (H) 1,500 - 7,800 cells/uL   Lymphs Abs 2,880 850 - 3,900 cells/uL   Absolute Monocytes 762 200 - 950 cells/uL   Eosinophils Absolute 182 15 - 500 cells/uL   Basophils Absolute 73 0 - 200 cells/uL   Neutrophils Relative % 67.8 %   Total Lymphocyte 23.8 %   Monocytes Relative 6.3 %   Eosinophils Relative 1.5 %   Basophils Relative 0.6 %  POCT urinalysis dipstick  Result Value Ref Range   Color, UA yellow    Clarity, UA clear    Glucose, UA Negative Negative   Bilirubin, UA negative    Ketones, UA negative    Spec Grav, UA 1.020 1.010 - 1.025   Blood, UA large    pH, UA 5.0 5.0 - 8.0   Protein, UA Positive (A) Negative   Urobilinogen, UA 0.2 0.2 or 1.0 E.U./dL   Nitrite, UA negative     Leukocytes, UA Trace (A) Negative   Appearance clear    Odor none       Assessment & Plan:   Problem List Items Addressed This Visit   None   Visit Diagnoses    Tenosynovitis, de Quervain    -  Primary   Improving with current treatment.  Will refer to PT for rehab   Relevant Orders   Ambulatory referral to Physical Therapy       Follow up plan: Return if symptoms worsen or fail to improve.

## 2020-07-08 ENCOUNTER — Ambulatory Visit: Payer: Medicare Other | Admitting: Occupational Therapy

## 2020-07-11 ENCOUNTER — Ambulatory Visit: Payer: Medicare Other | Admitting: Occupational Therapy

## 2020-07-17 ENCOUNTER — Ambulatory Visit: Payer: Medicare Other | Admitting: Occupational Therapy

## 2020-07-17 ENCOUNTER — Ambulatory Visit (INDEPENDENT_AMBULATORY_CARE_PROVIDER_SITE_OTHER): Payer: Medicare Other

## 2020-07-17 DIAGNOSIS — Z0000 Encounter for general adult medical examination without abnormal findings: Secondary | ICD-10-CM

## 2020-07-17 NOTE — Progress Notes (Signed)
Subjective:   Allison Phillips is a 70 y.o. female who presents for Medicare Annual (Subsequent) preventive examination.  Virtual Visit via Telephone Note  I connected with  Allison Phillips on 07/17/20 at  9:20 AM EDT by telephone and verified that I am speaking with the correct person using two identifiers.  Location: Patient: home Provider: [place] Persons participating in the virtual visit: [place]   I discussed the limitations, risks, security and privacy concerns of performing an evaluation and management service by telephone and the availability of in person appointments. The patient expressed understanding and agreed to proceed.  Interactive audio and video telecommunications were attempted between this nurse and patient, however failed, due to patient having technical difficulties OR patient did not have access to video capability.  We continued and completed visit with audio only.  Some vital signs may be absent or patient reported.   Clemetine Marker, LPN   Review of Systems     Cardiac Risk Factors include: advanced age (>107men, >79 women);obesity (BMI >30kg/m2);smoking/ tobacco exposure     Objective:    Today's Vitals   07/17/20 0921  PainSc: 3    There is no height or weight on file to calculate BMI.  Advanced Directives 07/17/2020 07/17/2019 01/18/2019 01/18/2019 01/08/2019 07/13/2018 02/17/2018  Does Patient Have a Medical Advance Directive? No No No No No No No  Would patient like information on creating a medical advance directive? No - Patient declined No - Patient declined No - Patient declined No - Patient declined No - Patient declined Yes (MAU/Ambulatory/Procedural Areas - Information given) -    Current Medications (verified) Outpatient Encounter Medications as of 07/17/2020  Medication Sig   Cyanocobalamin (B-12) 500 MCG TABS Take 2,500 mcg by mouth daily.    diclofenac Sodium (VOLTAREN) 1 % GEL Apply 2 g topically 4 (four) times daily  as needed (for chronic pain management).   Gluc-Chonn-MSM-Boswellia-Vit D (GLUCOSAMINE CHONDROITIN + D3 PO) Take by mouth.   Multiple Vitamin (MULTIVITAMIN) tablet Take 1 tablet by mouth daily. Centrum Silver   Omega-3 Fatty Acids (FISH OIL PO) Take 1,200 mg daily by mouth.    No facility-administered encounter medications on file as of 07/17/2020.    Allergies (verified) Adhesive [tape]   History: Past Medical History:  Diagnosis Date   Bursitis of left hip    Depression    Depression    Diverticulitis    Hepatic abscess 01/18/2019   Hip pain, chronic, left 03/18/2017   Hypertension    Joint pain    Lower back pain 03/18/2017   Obesity    Osteopenia 04/20/2017   April 2019; next DEXA April 2021   Osteoporosis    Prediabetes    Reflux    Sacroiliac joint pain 03/18/2017   Tobacco use    Past Surgical History:  Procedure Laterality Date   ANAL FISSURE REPAIR     APPENDECTOMY     CESAREAN SECTION  1978   COLONOSCOPY WITH PROPOFOL N/A 12/18/2014   Procedure: COLONOSCOPY WITH PROPOFOL;  Surgeon: Christene Lye, MD;  Location: [facility] ENDOSCOPY;  Service: Endoscopy;  Laterality: N/A;   COLONOSCOPY WITH PROPOFOL N/A 02/17/2018   Procedure: COLONOSCOPY WITH PROPOFOL;  Surgeon: Virgel Manifold, MD;  Location: [facility] ENDOSCOPY;  Service: Endoscopy;  Laterality: N/A;   LIPOMA EXCISION  07/2015   right shoulder   LIPOMA EXCISION  11/19/2015   OOPHORECTOMY     for "fibroids" not sure which ovary   Family History  Problem Relation Age of Onset   Hypertension Mother    Kidney disease Mother    Alzheimer's disease Mother    Thyroid cancer Son    Lung cancer Brother    Rectal cancer Brother    Breast cancer Neg Hx    Social History   Socioeconomic History   Marital status: Married    Spouse name: Nadara Mustard   Number of children: 1   Years of education: Not on file   Highest education level: Bachelor's degree (e.g., BA, AB, BS)  Occupational History   Occupation: Retired   Tobacco Use   Smoking status: Every Day    Packs/day: 0.50    Years: 15.00    Pack years: 7.50    Types: Cigarettes    Last attempt to quit: 10/24/2016    Years since quitting: 3.7   Smokeless tobacco: Never   Tobacco comments:    smoking cessation information provided; pt working on cutting down  Vaping Use   Vaping Use: Never used  Substance and Sexual Activity   Alcohol use: Yes    Alcohol/week: 4.0 standard drinks    Types: 4 Standard drinks or equivalent per week    Comment: on occasion   Drug use: No   Sexual activity: Yes    Partners: Male  Other Topics Concern   Not on file  Social History Narrative   Not on file   Social Determinants of Health   Financial Resource Strain: Low Risk    Difficulty of Paying Living Expenses: Not hard at all  Food Insecurity: No Food Insecurity   Worried About Charity fundraiser in the Last Year: Never true   [place] in the Last Year: Never true  Transportation Needs: No Transportation Needs   Lack of Transportation (Medical): No   Lack of Transportation (Non-Medical): No  Physical Activity: Sufficiently Active   Days of Exercise per Week: 5 days   Minutes of Exercise per Session: 30 min  Stress: No Stress Concern Present   Feeling of Stress : Not at all  Social Connections: Socially Integrated   Frequency of Communication with Friends and Family: More than three times a week   Frequency of Social Gatherings with Friends and Family: Three times a week   Attends Religious Services: More than 4 times per year   Active Member of Clubs or Organizations: Yes   Attends Music therapist: More than 4 times per year   Marital Status: Married    Tobacco Counseling Ready to quit: Yes Counseling given: Yes Tobacco comments: smoking cessation information provided; pt working on cutting down   Clinical Intake:  Pre-visit preparation completed: Yes  Pain : 0-10 Pain Score: 3  Pain Type: Acute pain Pain  Location: Other (Comment) (thumb) Pain Orientation: Left Pain Descriptors / Indicators: Aching, Sore Pain Onset: 1 to 4 weeks ago Pain Frequency: Constant     Nutritional Risks: None Diabetes: No  How often do you need to have someone help you when you read instructions, pamphlets, or other written materials from your doctor or pharmacy?: 1 - Never    Interpreter Needed?: No  Information entered by :: Clemetine Marker LPN   Activities of Daily Living In your present state of health, do you have any difficulty performing the following activities: 07/17/2020 06/24/2020  Hearing? N N  Comment declines hearing aids -  Vision? N N  Difficulty concentrating or making decisions? N N  Walking or climbing stairs? N N  Dressing or bathing? N N  Doing errands, shopping? N N  Preparing Food and eating ? N -  Using the Toilet? N -  In the past six months, have you accidently leaked urine? N -  Do you have problems with loss of bowel control? N -  Managing your Medications? N -  Managing your Finances? N -  Housekeeping or managing your Housekeeping? N -  Some recent data might be hidden    Patient Care Team: Delsa Grana, PA-C as PCP - General (Family Medicine) Christene Lye, MD (General Surgery) Sharlet Salina, MD as Referring Physician (Physical Medicine and Rehabilitation) Hessie Knows, MD as Consulting Physician (Orthopedic Surgery) Renata Caprice as Physician Assistant (Orthopedic Surgery)  Indicate any recent Medical Services you may have received from other than [facility] providers in the past year (date may be approximate).     Assessment:   This is a routine wellness examination for Allison Phillips.  Hearing/Vision screen Hearing Screening - Comments:: Pt denies hearing difficulty Vision Screening - Comments:: Annual vision screenings at [place] issues and exercise activities discussed: Current Exercise Habits: Home exercise routine, Type of exercise:  walking, Time (Minutes): 30, Frequency (Times/Week): 5, Weekly Exercise (Minutes/Week): 150, Intensity: Moderate, Exercise limited by: None identified   Goals Addressed             This Visit's Progress    DIET - INCREASE WATER INTAKE   On track    Recommend drinking 6-8 glasses of water per day      Quit smoking / using tobacco   Not on track      Depression Screen [facility] 2/9 Scores 07/17/2020 06/10/2020 04/07/2020 11/14/2019 11/05/2019 10/08/2019 07/17/2019  PHQ - 2 Score 0 1 0 0 0 0 0  PHQ- 9 Score - - - - - 0 -    Fall Risk Fall Risk  07/17/2020 06/24/2020 06/10/2020 04/07/2020 11/14/2019  Falls in the past year? 0 0 0 0 0  Number falls in past yr: 0 0 0 0 0  Injury with Fall? 0 0 0 0 0  Risk for fall due to : No Fall Risks - - - -  Follow up Falls prevention discussed Falls evaluation completed Falls evaluation completed - Falls evaluation completed    [place]:  Any stairs in or around the home? Yes  If so, are there any without handrails? No  Home free of loose throw rugs in walkways, pet beds, electrical cords, etc? Yes  Adequate lighting in your home to reduce risk of falls? Yes   ASSISTIVE DEVICES UTILIZED TO PREVENT FALLS:  Life alert? No  Use of a cane, walker or w/c? No  Grab bars in the bathroom? Yes  Shower chair or bench in shower? No  Elevated toilet seat or a handicapped toilet? Yes   TIMED UP AND GO:  Was the test performed? No . Telephonic visit.   Cognitive Function: Normal cognitive status assessed by direct observation by this Nurse Health Advisor. No abnormalities found.       6CIT Screen 12/06/2016  What Year? 0 points  What month? 0 points  What time? 0 points  Count back from 20 0 points  Months in reverse 0 points  Repeat phrase 0 points  Total Score 0    Immunizations Immunization History  Administered Date(s) Administered   Fluad Quad(high Dose 65+) 10/19/2018, 10/08/2019   Influenza, High Dose  Seasonal PF 11/14/2017   Moderna Sars-Covid-2  Vaccination 03/02/2019, 04/02/2019, 11/13/2019, 04/28/2020   Pneumococcal Conjugate-13 12/06/2016   Pneumococcal Polysaccharide-23 11/02/2010, 08/28/2018, 01/19/2019   Td 10/23/2007   Zoster, Live 11/16/2011    TDAP status: Due, Education has been provided regarding the importance of this vaccine. Advised may receive this vaccine at local pharmacy or Health Dept. Aware to provide a copy of the vaccination record if obtained from local pharmacy or Health Dept. Verbalized acceptance and understanding.  Flu Vaccine status: Up to date  Pneumococcal vaccine status: Up to date  Covid-19 vaccine status: Completed vaccines  Qualifies for Shingles Vaccine? Yes   Zostavax completed Yes   Shingrix Completed?: No.    Education has been provided regarding the importance of this vaccine. Patient has been advised to call insurance company to determine out of pocket expense if they have not yet received this vaccine. Advised may also receive vaccine at local pharmacy or Health Dept. Verbalized acceptance and understanding.  Screening Tests Health Maintenance  Topic Date Due   Zoster Vaccines- Shingrix (1 of 2) Never done   DEXA SCAN  04/21/2019   TETANUS/TDAP  06/10/2021 (Originally 10/22/2017)   INFLUENZA VACCINE  08/18/2020   MAMMOGRAM  03/07/2021   COLONOSCOPY (Pts 45-64yrs Insurance coverage will need to be confirmed)  02/18/2023   COVID-19 Vaccine  Completed   Hepatitis C Screening  Completed   PNA vac Low Risk Adult  Completed   HPV VACCINES  Aged Out    Health Maintenance  Health Maintenance Due  Topic Date Due   Zoster Vaccines- Shingrix (1 of 2) Never done   DEXA SCAN  04/21/2019    Colorectal cancer screening: Type of screening: Colonoscopy. Completed 02/17/18. Repeat every 5 years  Mammogram status: Completed 03/07/20. Repeat every year  Bone Density status: Completed 04/20/17. Results reflect: Bone density results: OSTEOPENIA. Repeat  every 2 years.  Lung Cancer Screening: (Low Dose CT Chest recommended if Age 31-80 years, 30 pack-year currently smoking OR have quit w/in 15years.) does not qualify.   Additional Screening:  Hepatitis C Screening: does qualify; Completed 12/24/16  Vision Screening: Recommended annual ophthalmology exams for early detection of glaucoma and other disorders of the eye. Is the patient up to date with their annual eye exam?  Yes  Who is the provider or what is the name of the office in which the patient attends annual eye exams? [facility].   Dental Screening: Recommended annual dental exams for proper oral hygiene  Community Resource Referral / Chronic Care Management: CRR required this visit?  No   CCM required this visit?  No      Plan:     I have personally reviewed and noted the following in the patient's chart:   Medical and social history Use of alcohol, tobacco or illicit drugs  Current medications and supplements including opioid prescriptions.  Functional ability and status Nutritional status Physical activity Advanced directives List of other physicians Hospitalizations, surgeries, and ER visits in previous 12 months Vitals Screenings to include cognitive, depression, and falls Referrals and appointments  In addition, I have reviewed and discussed with patient certain preventive protocols, quality metrics, and best practice recommendations. A written personalized care plan for preventive services as well as general preventive health recommendations were provided to patient.     Clemetine Marker, LPN   [id number]   Nurse Notes: none

## 2020-07-17 NOTE — Patient Instructions (Signed)
Allison Phillips , Thank you for taking time to come for your Medicare Wellness Visit. I appreciate your ongoing commitment to your health goals. Please review the following plan we discussed and let me know if I can assist you in the future.   Screening recommendations/referrals: Colonoscopy: done 02/17/18. Repeat in 2025 Mammogram: done 03/07/20 Bone Density: done 04/20/17. Please call [phone] to schedule your bone density screening.  Recommended yearly ophthalmology/optometry visit for glaucoma screening and checkup Recommended yearly dental visit for hygiene and checkup  Vaccinations: Influenza vaccine: done 10/08/19 Pneumococcal vaccine: done 01/19/19 Tdap vaccine: due Shingles vaccine: Shingrix discussed. Please contact your pharmacy for coverage information.  Covid-19: done 03/02/19, 04/02/19, 11/13/19 & 04/28/20  Advanced directives: Please bring a copy of your health care power of attorney and living will to the office at your convenience once you have completed that paperwork  Conditions/risks identified: If you wish to quit smoking, help is available. For free tobacco cessation program offerings call the [facility] at (708)784-5725 or Live Well Line at 443-168-2791. You may also visit [email or web address] or email [email or web address] for more information on other programs.   Next appointment: Follow up in one year for your annual wellness visit    Preventive Care 65 Years and Older, Female Preventive care refers to lifestyle choices and visits with your health care provider that can promote health and wellness. What does preventive care include? A yearly physical exam. This is also called an annual well check. Dental exams once or twice a year. Routine eye exams. Ask your health care provider how often you should have your eyes checked. Personal lifestyle choices, including: Daily care of your teeth and gums. Regular physical activity. Eating a healthy  diet. Avoiding tobacco and drug use. Limiting alcohol use. Practicing safe sex. Taking low-dose aspirin every day. Taking vitamin and mineral supplements as recommended by your health care provider. What happens during an annual well check? The services and screenings done by your health care provider during your annual well check will depend on your age, overall health, lifestyle risk factors, and family history of disease. Counseling  Your health care provider may ask you questions about your: Alcohol use. Tobacco use. Drug use. Emotional well-being. Home and relationship well-being. Sexual activity. Eating habits. History of falls. Memory and ability to understand (cognition). Work and work Statistician. Reproductive health. Screening  You may have the following tests or measurements: Height, weight, and BMI. Blood pressure. Lipid and cholesterol levels. These may be checked every 5 years, or more frequently if you are over 76 years old. Skin check. Lung cancer screening. You may have this screening every year starting at age 56 if you have a 30-pack-year history of smoking and currently smoke or have quit within the past 15 years. Fecal occult blood test (FOBT) of the stool. You may have this test every year starting at age 64. Flexible sigmoidoscopy or colonoscopy. You may have a sigmoidoscopy every 5 years or a colonoscopy every 10 years starting at age 54. Hepatitis C blood test. Hepatitis B blood test. Sexually transmitted disease (STD) testing. Diabetes screening. This is done by checking your blood sugar (glucose) after you have not eaten for a while (fasting). You may have this done every 1-3 years. Bone density scan. This is done to screen for osteoporosis. You may have this done starting at age 60. Mammogram. This may be done every 1-2 years. Talk to your health care provider about how often you should have regular  mammograms. Talk with your health care provider about  your test results, treatment options, and if necessary, the need for more tests. Vaccines  Your health care provider may recommend certain vaccines, such as: Influenza vaccine. This is recommended every year. Tetanus, diphtheria, and acellular pertussis (Tdap, Td) vaccine. You may need a Td booster every 10 years. Zoster vaccine. You may need this after age 92. Pneumococcal 13-valent conjugate (PCV13) vaccine. One dose is recommended after age 30. Pneumococcal polysaccharide (PPSV23) vaccine. One dose is recommended after age 17. Talk to your health care provider about which screenings and vaccines you need and how often you need them. This information is not intended to replace advice given to you by your health care provider. Make sure you discuss any questions you have with your health care provider. Document Released: 01/31/2015 Document Revised: 09/24/2015 Document Reviewed: 11/05/2014 Elsevier Interactive Patient Education  2017 [place] Prevention in the Home Falls can cause injuries. They can happen to people of all ages. There are many things you can do to make your home safe and to help prevent falls. What can I do on the outside of my home? Regularly fix the edges of walkways and driveways and fix any cracks. Remove anything that might make you trip as you walk through a door, such as a raised step or threshold. Trim any bushes or trees on the path to your home. Use bright outdoor lighting. Clear any walking paths of anything that might make someone trip, such as rocks or tools. Regularly check to see if handrails are loose or broken. Make sure that both sides of any steps have handrails. Any raised decks and porches should have guardrails on the edges. Have any leaves, snow, or ice cleared regularly. Use sand or salt on walking paths during winter. Clean up any spills in your garage right away. This includes oil or grease spills. What can I do in the bathroom? Use  night lights. Install grab bars by the toilet and in the tub and shower. Do not use towel bars as grab bars. Use non-skid mats or decals in the tub or shower. If you need to sit down in the shower, use a plastic, non-slip stool. Keep the floor dry. Clean up any water that spills on the floor as soon as it happens. Remove soap buildup in the tub or shower regularly. Attach bath mats securely with double-sided non-slip rug tape. Do not have throw rugs and other things on the floor that can make you trip. What can I do in the bedroom? Use night lights. Make sure that you have a light by your bed that is easy to reach. Do not use any sheets or blankets that are too big for your bed. They should not hang down onto the floor. Have a firm chair that has side arms. You can use this for support while you get dressed. Do not have throw rugs and other things on the floor that can make you trip. What can I do in the kitchen? Clean up any spills right away. Avoid walking on wet floors. Keep items that you use a lot in easy-to-reach places. If you need to reach something above you, use a strong step stool that has a grab bar. Keep electrical cords out of the way. Do not use floor polish or wax that makes floors slippery. If you must use wax, use non-skid floor wax. Do not have throw rugs and other things on the floor that can  make you trip. What can I do with my stairs? Do not leave any items on the stairs. Make sure that there are handrails on both sides of the stairs and use them. Fix handrails that are broken or loose. Make sure that handrails are as long as the stairways. Check any carpeting to make sure that it is firmly attached to the stairs. Fix any carpet that is loose or worn. Avoid having throw rugs at the top or bottom of the stairs. If you do have throw rugs, attach them to the floor with carpet tape. Make sure that you have a light switch at the top of the stairs and the bottom of the  stairs. If you do not have them, ask someone to add them for you. What else can I do to help prevent falls? Wear shoes that: Do not have high heels. Have rubber bottoms. Are comfortable and fit you well. Are closed at the toe. Do not wear sandals. If you use a stepladder: Make sure that it is fully opened. Do not climb a closed stepladder. Make sure that both sides of the stepladder are locked into place. Ask someone to hold it for you, if possible. Clearly mark and make sure that you can see: Any grab bars or handrails. First and last steps. Where the edge of each step is. Use tools that help you move around (mobility aids) if they are needed. These include: Canes. Walkers. Scooters. Crutches. Turn on the lights when you go into a dark area. Replace any light bulbs as soon as they burn out. Set up your furniture so you have a clear path. Avoid moving your furniture around. If any of your floors are uneven, fix them. If there are any pets around you, be aware of where they are. Review your medicines with your doctor. Some medicines can make you feel dizzy. This can increase your chance of falling. Ask your doctor what other things that you can do to help prevent falls. This information is not intended to replace advice given to you by your health care provider. Make sure you discuss any questions you have with your health care provider. Document Released: 10/31/2008 Document Revised: 06/12/2015 Document Reviewed: 02/08/2014 Elsevier Interactive Patient Education  2017 [employer].

## 2020-07-24 ENCOUNTER — Ambulatory Visit: Payer: Medicare Other | Attending: Unknown Physician Specialty | Admitting: Occupational Therapy

## 2020-07-24 ENCOUNTER — Encounter: Payer: Self-pay | Admitting: Occupational Therapy

## 2020-07-24 DIAGNOSIS — M65312 Trigger thumb, left thumb: Secondary | ICD-10-CM | POA: Diagnosis present

## 2020-07-24 NOTE — Therapy (Signed)
[facility] [address] [place], [address] Phone: [phone]   Fax:  [phone]  Occupational Therapy Evaluation  Patient Details  Name: Allison Phillips MRN: [id number] Date of Birth: 24-Oct-1950 Referring Provider (OT): [name]   Encounter Date: 07/24/2020   OT End of Session - 07/24/20 1206     Visit Number 1    Number of Visits 1    Date for OT Re-Evaluation 07/24/20    OT Start Time 0945    OT Stop Time 1025    OT Time Calculation (min) 40 min    Activity Tolerance Patient tolerated treatment well    Behavior During Therapy [facility] for tasks assessed/performed             Past Medical History:  Diagnosis Date   Bursitis of left hip    Depression    Depression    Diverticulitis    Hepatic abscess 01/18/2019   Hip pain, chronic, left 03/18/2017   Hypertension    Joint pain    Lower back pain 03/18/2017   Obesity    Osteopenia 04/20/2017   April 2019; next DEXA April 2021   Osteoporosis    Prediabetes    Reflux    Sacroiliac joint pain 03/18/2017   Tobacco use     Past Surgical History:  Procedure Laterality Date   ANAL [place]   COLONOSCOPY WITH PROPOFOL N/A 12/18/2014   Procedure: COLONOSCOPY WITH PROPOFOL;  Surgeon: Christene Lye, MD;  Location: [facility] ENDOSCOPY;  Service: Endoscopy;  Laterality: N/A;   COLONOSCOPY WITH PROPOFOL N/A 02/17/2018   Procedure: COLONOSCOPY WITH PROPOFOL;  Surgeon: Virgel Manifold, MD;  Location: [facility] ENDOSCOPY;  Service: Endoscopy;  Laterality: N/A;   LIPOMA EXCISION  07/2015   right shoulder   LIPOMA EXCISION  11/19/2015   OOPHORECTOMY     for "fibroids" not sure which ovary    There were no vitals filed for this visit.   Subjective Assessment - 07/24/20 1029     Subjective  My L thumb hurts so bad -probably for about 3-4 months now - this soft splint helps but last night it was about  8-9/10 -if I keep it still - no pain    Pertinent History Pt seen NP on 06/10/20 with thumb pain - DeQuervain diagnose with and recommend thumb spica and then Voltaren ointment - but pain cont  and was on 06/24/20 refer to OT -    Patient Stated Goals Want my thumb pain better so I can use my hand to cook, work in yard, play with grandkids    Currently in Pain? Yes    Pain Location --   Thumb   Pain Orientation Left    Pain Descriptors / Indicators Aching;Tender;Sore    Pain Type Acute pain    Pain Onset More than a month ago               [facility] OT Assessment - 07/24/20 0001       Assessment   Medical Diagnosis L trigger thumb    Referring Provider (OT) Malachy Mood wicker    Onset Date/Surgical Date 03/18/20    Hand Dominance Right      Prior Function   Vocation Retired    Leisure yardwork, house work and play with grandkids      Right Hand AROM   R Thumb MCP 0-60  30 Degrees    R Thumb IP 0-80 60 Degrees      Left Hand AROM   L Thumb MCP 0-60 30 Degrees    L Thumb IP 0-80 60 Degrees    L Thumb Opposition to Index --   Opposition to base of 5th - pain                          OT Education - 07/24/20 1206     Education Details finding of eval and POC/recommendations    Person(s) Educated Patient    Methods Explanation;Demonstration;Tactile cues;Verbal cues;Handout    Comprehension Verbal cues required;Returned demonstration;Verbalized understanding                        Plan - 07/24/20 1210     Clinical Impression Statement Pt refer to OT with doagnosis of L  DeQuervains - but upon OT evaluation pt present with L trigger thumb - pain 8-9/10 with AROM of thumb and some triggering per pt- she is tender over A1pulley at [facility] of thumb - pt refer to Dr Candelaria Stagers at [place] [facility] for ultrasounded shot - pt was ed on HEP until seeing MD    OT Occupational Profile and History Problem Focused Assessment - Including review of records relating to  presenting problem    Occupational performance deficits (Please refer to evaluation for details): Play;Leisure;Social Participation    Body Structure / Function / Physical Skills ADL;Pain;IADL;FMC    OT Frequency One time visit    Consulted and Agree with Plan of Care Patient             Patient will benefit from skilled therapeutic intervention in order to improve the following deficits and impairments:   Body Structure / Function / Physical Skills: ADL, Pain, IADL, [place]       Visit Diagnosis: Trigger thumb of left hand - Plan: Ot plan of care cert/re-cert    Problem List Patient Active Problem List   Diagnosis Date Noted   Aortic atherosclerosis ([place]) 11/14/2019   Obesity (BMI 35.0-39.9 without comorbidity) 01/06/2018   Osteopenia 04/20/2017   Essential hypertension, benign 03/08/2016    Rosalyn Gess OTR/L,CLT 07/24/2020, 12:42 PM  [facility] [address] [place], [address] Phone: [phone]   Fax:  [phone]  Name: Allison Phillips MRN: [id number] Date of Birth: 1950-11-07

## 2020-07-31 ENCOUNTER — Encounter: Payer: Self-pay | Admitting: Pulmonary Disease

## 2020-07-31 ENCOUNTER — Ambulatory Visit (INDEPENDENT_AMBULATORY_CARE_PROVIDER_SITE_OTHER): Payer: Medicare Other | Admitting: Pulmonary Disease

## 2020-07-31 ENCOUNTER — Other Ambulatory Visit: Payer: Self-pay

## 2020-07-31 VITALS — BP 118/80 | HR 64 | Temp 98.2°F | Ht 67.0 in | Wt 214.2 lb

## 2020-07-31 DIAGNOSIS — R0683 Snoring: Secondary | ICD-10-CM

## 2020-07-31 NOTE — Progress Notes (Signed)
[facility]  Chief Complaint  Patient presents with   Consult    OSA CONSULT- loud snoring    Constitutional:  BP 118/80 (BP Location: Left Arm, Patient Position: Sitting, Cuff Size: Normal)   Pulse 64   Temp 98.2 F (36.8 C) (Oral)   Ht 5\' 7"  (1.702 m)   Wt 214 lb 3.2 oz (97.2 kg)   LMP  (LMP Unknown) Comment: rt ovary removed  SpO2 98%   BMI 33.55 kg/m   Past Medical History:  Depression, Diverticulitis, Hepatic abscess, HTN, Hip pain, Back pain, Osteopenia, Osteoporosis, Pre-DM, GERD  Past Surgical History:  She  has a past surgical history that includes Anal fissure repair; Cesarean section (8413); Appendectomy; Colonoscopy with propofol (N/A, 12/18/2014); Lipoma excision (07/2015); Lipoma excision (11/19/2015); Oophorectomy; and Colonoscopy with propofol (N/A, 02/17/2018).  Brief Summary:  Allison Phillips is a 70 y.o. female smoker with snoring.      Subjective:   She had a sleep study several years ago and was diagnosed with sleep apnea.  She had CPAP, but lost her machine.  Her husband has been concerned about her snoring and says she stops breathing.  As a result she decided to revisit whether she has sleep apnea.  She can fall asleep in the evening while watching TV.  She used to have trouble with TMJ, but not recently.  She goes to sleep at 10 pm.  She falls asleep few minutes.  She wakes up 3 times to use the bathroom.  She gets out of bed at 7 am.  She feels okay in the morning.  She denies morning headache.  She does not use anything to help her fall sleep or stay awake.  She denies sleep walking, sleep talking, bruxism, or nightmares.  There is no history of restless legs.  She denies sleep hallucinations, sleep paralysis, or cataplexy.  The Epworth score is 5 out of 24.   Physical Exam:   Appearance - well kempt   ENMT - no sinus tenderness, no oral exudate, no LAN, Mallampati 3 airway, no  stridor  Respiratory - equal breath sounds bilaterally, no wheezing or rales  CV - s1s2 regular rate and rhythm, no murmurs  Ext - no clubbing, no edema  Skin - no rashes  Psych - normal mood and affect   Sleep Tests:    Cardiac Tests:  Echo 01/20/19 >> EF 60 to 65%, mild LVH  Social History:  She  reports that she has been smoking cigarettes. She has a 7.50 pack-year smoking history. She has never used smokeless tobacco. She reports current alcohol use of about 4.0 standard drinks of alcohol per week. She reports that she does not use drugs.  Family History:  Her family history includes Alzheimer's disease in her mother; Hypertension in her mother; Kidney disease in her mother; Lung cancer in her brother; Rectal cancer in her brother; Thyroid cancer in her son.    Discussion:  She has snoring, sleep disruption, apnea, and daytime sleepiness.  She has history of hypertension and depression, and prior history of sleep apnea.  I am concerned she still has obstructive sleep apnea.  Assessment/Plan:   Snoring with excessive daytime sleepiness. - will need to arrange for a home sleep study  Obesity. - discussed how weight can impact sleep and risk for sleep disordered breathing - discussed options to assist with weight loss: combination of diet modification, cardiovascular and strength training exercises  Cardiovascular risk. -  had an extensive discussion regarding the adverse health consequences related to untreated sleep disordered breathing - specifically discussed the risks for hypertension, coronary artery disease, cardiac dysrhythmias, cerebrovascular disease, and diabetes - lifestyle modification discussed  Safe driving practices. - discussed how sleep disruption can increase risk of accidents, particularly when driving - safe driving practices were discussed  Therapies for obstructive sleep apnea. - if the sleep study shows significant sleep apnea, then various  therapies for treatment were reviewed: CPAP, oral appliance, and surgical interventions  Time Spent Involved in Patient Care on Day of Examination:  32 minutes  Follow up:   Patient Instructions  Will arrange for home sleep study Will call to arrange for follow up after sleep study reviewed  Medication List:   Allergies as of 07/31/2020       Reactions   Adhesive [tape]    Rash/dermatitis        Medication List        Accurate as of July 31, 2020 10:25 AM. If you have any questions, ask your nurse or doctor.          B-12 500 MCG Tabs Take 2,500 mcg by mouth daily.   diclofenac Sodium 1 % Gel Commonly known as: VOLTAREN Apply 2 g topically 4 (four) times daily as needed (for chronic pain management).   FISH OIL PO Take 1,200 mg daily by mouth.   GLUCOSAMINE CHONDROITIN + D3 PO Take by mouth.   multivitamin tablet Take 1 tablet by mouth daily. Centrum Silver        Signature:  Chesley Mires, MD [place] Pager - [phone] 07/31/2020, 10:25 AM

## 2020-07-31 NOTE — Patient Instructions (Signed)
Will arrange for home sleep study Will call to arrange for follow up after sleep study reviewed  

## 2020-09-04 ENCOUNTER — Ambulatory Visit: Payer: Medicare Other

## 2020-09-04 ENCOUNTER — Other Ambulatory Visit: Payer: Self-pay

## 2020-09-04 DIAGNOSIS — G4733 Obstructive sleep apnea (adult) (pediatric): Secondary | ICD-10-CM | POA: Diagnosis not present

## 2020-09-04 DIAGNOSIS — R0683 Snoring: Secondary | ICD-10-CM

## 2020-09-08 ENCOUNTER — Telehealth: Payer: Self-pay | Admitting: Pulmonary Disease

## 2020-09-08 DIAGNOSIS — G4733 Obstructive sleep apnea (adult) (pediatric): Secondary | ICD-10-CM | POA: Diagnosis not present

## 2020-09-08 NOTE — Telephone Encounter (Signed)
HST 09/04/20 >> AHI 16.9, SpO2 low 90%   Please inform her that her sleep study shows moderate obstructive sleep apnea.  Please arrange for ROV with me or NP to discuss treatment options.

## 2020-09-09 NOTE — Telephone Encounter (Signed)
Called and spoke with patient to let her know of HST results from Dr. Halford Chessman. She expressed understanding. She is now scheduled for follow up in [place] with [facility]. Nothing further needed at this time.   Next Appt With Pulmonology (Tammy Parrett, NP)09/23/2020 at 10:00 AM

## 2020-09-23 ENCOUNTER — Other Ambulatory Visit: Payer: Self-pay

## 2020-09-23 ENCOUNTER — Ambulatory Visit (INDEPENDENT_AMBULATORY_CARE_PROVIDER_SITE_OTHER): Payer: Medicare Other | Admitting: Adult Health

## 2020-09-23 ENCOUNTER — Encounter: Payer: Self-pay | Admitting: Adult Health

## 2020-09-23 VITALS — BP 120/78 | HR 80 | Temp 98.4°F | Ht 67.0 in | Wt 214.8 lb

## 2020-09-23 DIAGNOSIS — G4733 Obstructive sleep apnea (adult) (pediatric): Secondary | ICD-10-CM

## 2020-09-23 NOTE — Assessment & Plan Note (Signed)
Moderate OSA - patient education given .  Will begin CPAP , CPAP education discuss  Begin CPAP 5-15cmH2O. , mask of choice   Plan  Patient Instructions  Begin CPAP at bedtime Work on healthy weight loss Do not drive if sleepy Follow-up in 3 months with Dr. Halford Chessman  or APP and As needed

## 2020-09-23 NOTE — Patient Instructions (Signed)
Begin CPAP at bedtime Work on healthy weight loss Do not drive if sleepy Follow-up in 3 months with Dr. Halford Chessman  or APP and As needed

## 2020-09-23 NOTE — Progress Notes (Signed)
$'@Patient'X$  ID: Allison Phillips, female    DOB: 1950-12-02, 70 y.o.   MRN: [id number]  Chief Complaint  Patient presents with   Follow-up    Referring provider: Laurell Roof  HPI: 70 year old female seen for sleep consult July 31, 2020 for loud snoring and daytime sleepiness.  Found to have moderate obstructive sleep apnea on home sleep study   TEST/EVENTS :  HST 09/04/20 >> AHI 16.9, SpO2 low 90%  09/23/2020 Follow up : OSA  Patient presents for a 68-monthfollow-up.  Patient was seen last visit for a sleep consult.  She was complaining of loud snoring and daytime sleepiness.  Patient was set up for home sleep study this was completed on September 04, 2020.  This showed moderate sleep apnea with AHI at 16.9 and SPO2 low at 90%.  We discussed her sleep study results.  Went over treatment options including weight loss, oral appliance and CPAP.  Patient would like to proceed with CPAP.  Was diagnosed with CPAP years ago but lost her CPAP machine. Wants to restart CPAP to help with daytime sleepiness.   Allergies  Allergen Reactions   Adhesive [Tape]     Rash/dermatitis    Immunization History  Administered Date(s) Administered   Fluad Quad(high Dose 65+) 10/19/2018, 10/08/2019   Influenza, High Dose Seasonal PF 11/14/2017   Moderna Sars-Covid-2 Vaccination 03/02/2019, 04/02/2019, 11/13/2019, 04/28/2020   Pneumococcal Conjugate-13 12/06/2016   Pneumococcal Polysaccharide-23 11/02/2010, 08/28/2018, 01/19/2019   Td 10/23/2007   Zoster, Live 11/16/2011    Past Medical History:  Diagnosis Date   Bursitis of left hip    Depression    Depression    Diverticulitis    Hepatic abscess 01/18/2019   Hip pain, chronic, left 03/18/2017   Hypertension    Joint pain    Lower back pain 03/18/2017   Obesity    Osteopenia 04/20/2017   April 2019; next DEXA April 2021   Osteoporosis    Prediabetes    Reflux    Sacroiliac joint pain 03/18/2017   Tobacco use     Tobacco History: Social  History   Tobacco Use  Smoking Status Every Day   Packs/day: 0.50   Years: 15.00   Pack years: 7.50   Types: Cigarettes   Last attempt to quit: 10/24/2016   Years since quitting: 3.9  Smokeless Tobacco Never  Tobacco Comments   smoking cessation information provided; pt working on cutting down   Ready to quit: Not Answered Counseling given: Not Answered Tobacco comments: smoking cessation information provided; pt working on cutting down   Outpatient Medications Prior to Visit  Medication Sig Dispense Refill   Cyanocobalamin (B-12) 500 MCG TABS Take 2,500 mcg by mouth daily.      diclofenac Sodium (VOLTAREN) 1 % GEL Apply 2 g topically 4 (four) times daily as needed (for chronic pain management). 100 g 5   Gluc-Chonn-MSM-Boswellia-Vit D (GLUCOSAMINE CHONDROITIN + D3 PO) Take by mouth.     Multiple Vitamin (MULTIVITAMIN) tablet Take 1 tablet by mouth daily. Centrum Silver     Omega-3 Fatty Acids (FISH OIL PO) Take 1,200 mg daily by mouth.      No facility-administered medications prior to visit.     Review of Systems:   Constitutional:   No  weight loss, night sweats,  Fevers, chills, fatigue, or  lassitude.  HEENT:   No headaches,  Difficulty swallowing,  Tooth/dental problems, or  Sore throat,  No sneezing, itching, ear ache, nasal congestion, post nasal drip,   CV:  No chest pain,  Orthopnea, PND, swelling in lower extremities, anasarca, dizziness, palpitations, syncope.   GI  No heartburn, indigestion, abdominal pain, nausea, vomiting, diarrhea, change in bowel habits, loss of appetite, bloody stools.   Resp: No shortness of breath with exertion or at rest.  No excess mucus, no productive cough,  No non-productive cough,  No coughing up of blood.  No change in color of mucus.  No wheezing.  No chest wall deformity  Skin: no rash or lesions.  GU: no dysuria, change in color of urine, no urgency or frequency.  No flank pain, no hematuria   MS:  No joint  pain or swelling.  No decreased range of motion.  No back pain.    Physical Exam  BP 120/78 (BP Location: Left Arm, Patient Position: Sitting, Cuff Size: Normal)   Pulse 80   Temp 98.4 F (36.9 C) (Oral)   Ht '5\' 7"'$  (1.702 m)   Wt 214 lb 12.8 oz (97.4 kg)   LMP  (LMP Unknown) Comment: rt ovary removed  SpO2 98%   BMI 33.64 kg/m   GEN: A/Ox3; pleasant , NAD, well nourished    HEENT:  [place]/AT,   NOSE-clear, THROAT-clear, no lesions, no postnasal drip or exudate noted.  Class 2 MP airway   NECK:  Supple w/ fair ROM; no JVD; normal carotid impulses w/o bruits; no thyromegaly or nodules palpated; no lymphadenopathy.    RESP  Clear  P & A; w/o, wheezes/ rales/ or rhonchi. no accessory muscle use, no dullness to percussion  CARD:  RRR, no m/r/g, no peripheral edema, pulses intact, no cyanosis or clubbing.  GI:   Soft & nt; nml bowel sounds; no organomegaly or masses detected.   Musco: Warm bil, no deformities or joint swelling noted.   Neuro: alert, no focal deficits noted.    Skin: Warm, no lesions or rashes    Lab Results:      BNP No results found for: BNP  ProBNP No results found for: PROBNP  Imaging: No results found.    No flowsheet data found.  No results found for: NITRICOXIDE      Assessment & Plan:   OSA (obstructive sleep apnea) Moderate OSA - patient education given .  Will begin CPAP , CPAP education discuss  Begin CPAP 5-15cmH2O. , mask of choice   Plan  Patient Instructions  Begin CPAP at bedtime Work on healthy weight loss Do not drive if sleepy Follow-up in 3 months with Dr. Halford Chessman  or APP and As needed        Morbid obesity ([place]) Discussed healthy weight loss. Declines referral to weight management .      Rexene Edison, NP 09/23/2020

## 2020-09-23 NOTE — Assessment & Plan Note (Signed)
Discussed healthy weight loss. Declines referral to weight management .

## 2020-09-24 NOTE — Progress Notes (Signed)
Reviewed and agree with assessment/plan.   Chesley Mires, MD [facility] Pulmonary/Critical Care 09/24/2020, 8:48 AM Pager:  [phone]

## 2020-09-30 ENCOUNTER — Ambulatory Visit: Payer: Medicare Other | Admitting: Family Medicine

## 2020-09-30 ENCOUNTER — Encounter: Payer: Self-pay | Admitting: Family Medicine

## 2020-09-30 ENCOUNTER — Ambulatory Visit (INDEPENDENT_AMBULATORY_CARE_PROVIDER_SITE_OTHER): Payer: Medicare Other | Admitting: Family Medicine

## 2020-09-30 ENCOUNTER — Other Ambulatory Visit: Payer: Self-pay

## 2020-09-30 VITALS — BP 122/68 | HR 80 | Temp 98.1°F | Resp 16 | Ht 66.0 in | Wt 217.2 lb

## 2020-09-30 DIAGNOSIS — R110 Nausea: Secondary | ICD-10-CM

## 2020-09-30 NOTE — Patient Instructions (Signed)
It was great to see you!  Our plans for today:  - Let us know if your symptoms return. - We are checking some labs today, we will release these results to your MyChart.  Take care and seek immediate care sooner if you develop any concerns.   Dr. Ky Barban

## 2020-09-30 NOTE — Progress Notes (Signed)
   SUBJECTIVE:   CHIEF COMPLAINT / HPI:   ABDOMINAL ISSUES - last week had one self-resolved episode of nausea and sweating.  - has h/o hepatic abscess with similar initial presentation - denies abd pain, changes in BM, blood in stool, current N/V, rashes, fever, weight loss.    OBJECTIVE:   BP 122/68   Pulse 80   Temp 98.1 F (36.7 C)   Resp 16   Ht '5\' 6"'$  (1.676 m)   Wt 217 lb 3.2 oz (98.5 kg)   LMP  (LMP Unknown) Comment: rt ovary removed  SpO2 98%   BMI 35.06 kg/m   Gen: well appearing, in NAD Card: RRR Lungs: CTAB Abd: soft, NTND, +BS. No organomegaly. Negative murphy sign. Jaundice not present. Skin: no rash.  Ext: WWP, no edema   ASSESSMENT/PLAN:   Nausea Now self-resolved. Benign exam with normal vitals, reassurance provided. Will assess liver function and CBC given h/o hepatic abscess. F/u if symptoms return.   Myles Gip, DO

## 2020-10-01 LAB — CBC WITH DIFFERENTIAL/PLATELET
Absolute Monocytes: 378 cells/uL (ref 200–950)
Basophils Absolute: 102 cells/uL (ref 0–200)
Basophils Relative: 1.7 %
Eosinophils Absolute: 162 cells/uL (ref 15–500)
Eosinophils Relative: 2.7 %
HCT: 40.4 % (ref 35.0–45.0)
Hemoglobin: 13 g/dL (ref 11.7–15.5)
Lymphs Abs: 2214 cells/uL (ref 850–3900)
MCH: 28.4 pg (ref 27.0–33.0)
MCHC: 32.2 g/dL (ref 32.0–36.0)
MCV: 88.4 fL (ref 80.0–100.0)
MPV: 11.1 fL (ref 7.5–12.5)
Monocytes Relative: 6.3 %
Neutro Abs: 3144 cells/uL (ref 1500–7800)
Neutrophils Relative %: 52.4 %
Platelets: 303 10*3/uL (ref 140–400)
RBC: 4.57 10*6/uL (ref 3.80–5.10)
RDW: 13.1 % (ref 11.0–15.0)
Total Lymphocyte: 36.9 %
WBC: 6 10*3/uL (ref 3.8–10.8)

## 2020-10-01 LAB — COMPLETE METABOLIC PANEL WITH GFR
AG Ratio: 1.6 (calc) (ref 1.0–2.5)
ALT: 9 U/L (ref 6–29)
AST: 14 U/L (ref 10–35)
Albumin: 4.2 g/dL (ref 3.6–5.1)
Alkaline phosphatase (APISO): 49 U/L (ref 37–153)
BUN: 17 mg/dL (ref 7–25)
CO2: 29 mmol/L (ref 20–32)
Calcium: 9.6 mg/dL (ref 8.6–10.4)
Chloride: 107 mmol/L (ref 98–110)
Creat: 0.96 mg/dL (ref 0.50–1.05)
Globulin: 2.6 g/dL (calc) (ref 1.9–3.7)
Glucose, Bld: 88 mg/dL (ref 65–99)
Potassium: 5.4 mmol/L — ABNORMAL HIGH (ref 3.5–5.3)
Sodium: 142 mmol/L (ref 135–146)
Total Bilirubin: 0.3 mg/dL (ref 0.2–1.2)
Total Protein: 6.8 g/dL (ref 6.1–8.1)
eGFR: 64 mL/min/{1.73_m2} (ref >=60)

## 2020-10-06 ENCOUNTER — Telehealth: Payer: Self-pay

## 2020-10-06 ENCOUNTER — Other Ambulatory Visit: Payer: Self-pay

## 2020-10-06 DIAGNOSIS — M8580 Other specified disorders of bone density and structure, unspecified site: Secondary | ICD-10-CM

## 2020-10-06 NOTE — Telephone Encounter (Signed)
Copied from [place] [id number]. Topic: Referral - Status >> Oct 06, 2020 11:15 AM Lennox Solders wrote: Reason for CRM: Per pt she had a referral for bone density test that was put in system in sept 2021 and the order has expired per [facility]. The pt was never notified and [facility] needs another order and pt would like for our office to schedule her bone density test any date in afternoon . Pt has osteopenia and does not remember where she had the last bone density test and when

## 2020-10-06 NOTE — Telephone Encounter (Signed)
Oct 6 @ 2:20, Left vm with pt

## 2020-10-23 ENCOUNTER — Ambulatory Visit
Admission: RE | Admit: 2020-10-23 | Discharge: 2020-10-23 | Disposition: A | Discharge status: Home / Self Care | Payer: Medicare Other | Source: Ambulatory Visit | Attending: Family Medicine | Admitting: Family Medicine

## 2020-10-23 ENCOUNTER — Other Ambulatory Visit: Payer: Self-pay

## 2020-10-23 DIAGNOSIS — M8580 Other specified disorders of bone density and structure, unspecified site: Secondary | ICD-10-CM

## 2020-10-23 DIAGNOSIS — Z780 Asymptomatic menopausal state: Secondary | ICD-10-CM | POA: Insufficient documentation

## 2020-10-23 DIAGNOSIS — E559 Vitamin D deficiency, unspecified: Secondary | ICD-10-CM | POA: Diagnosis not present

## 2020-10-23 DIAGNOSIS — Z13820 Encounter for screening for osteoporosis: Secondary | ICD-10-CM | POA: Diagnosis not present

## 2020-10-23 DIAGNOSIS — M8589 Other specified disorders of bone density and structure, multiple sites: Secondary | ICD-10-CM | POA: Diagnosis not present

## 2020-12-29 ENCOUNTER — Telehealth: Payer: Self-pay

## 2020-12-29 NOTE — Telephone Encounter (Signed)
Called and lvm in regards to patients cpap machine.

## 2020-12-29 NOTE — Telephone Encounter (Signed)
Brad from [employer] is working on getting patients compliance. Will await for an update.

## 2020-12-30 ENCOUNTER — Encounter: Payer: Self-pay | Admitting: Adult Health

## 2020-12-30 ENCOUNTER — Ambulatory Visit (INDEPENDENT_AMBULATORY_CARE_PROVIDER_SITE_OTHER): Payer: Medicare Other | Admitting: Adult Health

## 2020-12-30 ENCOUNTER — Other Ambulatory Visit: Payer: Self-pay

## 2020-12-30 VITALS — BP 130/78 | HR 61 | Temp 98.1°F | Ht 65.0 in | Wt 218.6 lb

## 2020-12-30 DIAGNOSIS — G4733 Obstructive sleep apnea (adult) (pediatric): Secondary | ICD-10-CM | POA: Diagnosis not present

## 2020-12-30 DIAGNOSIS — E669 Obesity, unspecified: Secondary | ICD-10-CM

## 2020-12-30 DIAGNOSIS — F17210 Nicotine dependence, cigarettes, uncomplicated: Secondary | ICD-10-CM

## 2020-12-30 DIAGNOSIS — F17200 Nicotine dependence, unspecified, uncomplicated: Secondary | ICD-10-CM

## 2020-12-30 DIAGNOSIS — Z720 Tobacco use: Secondary | ICD-10-CM | POA: Diagnosis not present

## 2020-12-30 NOTE — Assessment & Plan Note (Signed)
Smoking cessation discussed  Refer to LDCT chest screening program (22 PY )

## 2020-12-30 NOTE — Progress Notes (Signed)
@Patient  ID: Allison Phillips, female    DOB: 15-Jul-1950, 70 y.o.   MRN: [id number]  Chief Complaint  Patient presents with   Follow-up    Referring provider: Laurell Roof  HPI: 70 year old female smoker seen for sleep consult July 31, 2020 for snoring and daytime sleepiness found to have moderate obstructive sleep apnea  TEST/EVENTS :  HST 09/04/20 >> AHI 16.9, SpO2 low 90%  12/30/2020 Follow up: OSA  Patient presents for a 41-month follow-up.  Patient was recently diagnosed with sleep apnea.  She was started on CPAP.  Patient says that she has started CPAP.  She does feel like it is helping some with her daytime sleepiness and restless sleep.  Also has help with her snoring.  She tries to wear her CPAP each night however has been staying in with her mother who is ill and does not take her CPAP when she goes there.  CPAP download shows compliance at 40%.  Daily average usage at 4 hours. AHI 2.0/hour.  Patient is on auto CPAP 5 to 15 cm of H2O.  We discussed CPAP compliance.  Patient education given on CPAP and sleep apnea Using a nasal mask .  Flu and covid vaccine utd.   Patient smokes 1/2 PPD for last 45 yrs. Has quit briefly on/off couple of times. Discussed LDCT chest screening program. Discussed smoking cessation in detail.   Allergies  Allergen Reactions   Adhesive [Tape]     Rash/dermatitis    Immunization History  Administered Date(s) Administered   Fluad Quad(high Dose 65+) 10/19/2018, 10/08/2019, 10/08/2020   Influenza, High Dose Seasonal PF 11/14/2017   Moderna Sars-Covid-2 Vaccination 03/02/2019, 04/02/2019, 11/13/2019, 04/28/2020   Pneumococcal Conjugate-13 12/06/2016   Pneumococcal Polysaccharide-23 11/02/2010, 08/28/2018, 01/19/2019   Td 10/23/2007   Zoster, Live 11/16/2011    Past Medical History:  Diagnosis Date   Bursitis of left hip    Depression    Depression    Diverticulitis    Hepatic abscess 01/18/2019   Hip pain, chronic, left 03/18/2017    Hypertension    Joint pain    Lower back pain 03/18/2017   Obesity    Osteopenia 04/20/2017   April 2019; next DEXA April 2021   Osteoporosis    Prediabetes    Reflux    Sacroiliac joint pain 03/18/2017   Tobacco use     Tobacco History: Social History   Tobacco Use  Smoking Status Every Day   Packs/day: 0.50   Years: 15.00   Pack years: 7.50   Types: Cigarettes   Last attempt to quit: 10/24/2016   Years since quitting: 4.1  Smokeless Tobacco Never  Tobacco Comments   smoking cessation information provided; pt working on cutting down      0.25ppd-12/30/2020   Ready to quit: Not Answered Counseling given: Not Answered Tobacco comments: smoking cessation information provided; pt working on cutting down  0.25ppd-12/30/2020   Outpatient Medications Prior to Visit  Medication Sig Dispense Refill   Cyanocobalamin (B-12) 500 MCG TABS Take 2,500 mcg by mouth daily.      diclofenac Sodium (VOLTAREN) 1 % GEL Apply 2 g topically 4 (four) times daily as needed (for chronic pain management). 100 g 5   Gluc-Chonn-MSM-Boswellia-Vit D (GLUCOSAMINE CHONDROITIN + D3 PO) Take by mouth.     Multiple Vitamin (MULTIVITAMIN) tablet Take 1 tablet by mouth daily. Centrum Silver     Omega-3 Fatty Acids (FISH OIL PO) Take 1,200 mg daily by mouth.  No facility-administered medications prior to visit.     Review of Systems:   Constitutional:   No  weight loss, night sweats,  Fevers, chills, fatigue, or  lassitude.  HEENT:   No headaches,  Difficulty swallowing,  Tooth/dental problems, or  Sore throat,                No sneezing, itching, ear ache, nasal congestion, post nasal drip,   CV:  No chest pain,  Orthopnea, PND, swelling in lower extremities, anasarca, dizziness, palpitations, syncope.   GI  No heartburn, indigestion, abdominal pain, nausea, vomiting, diarrhea, change in bowel habits, loss of appetite, bloody stools.   Resp: No shortness of breath with exertion or at rest.  No  excess mucus, no productive cough,  No non-productive cough,  No coughing up of blood.  No change in color of mucus.  No wheezing.  No chest wall deformity  Skin: no rash or lesions.  GU: no dysuria, change in color of urine, no urgency or frequency.  No flank pain, no hematuria   MS:  No joint pain or swelling.  No decreased range of motion.  No back pain.    Physical Exam  BP 130/78 (BP Location: Left Arm, Patient Position: Sitting, Cuff Size: Normal)    Pulse 61    Temp 98.1 F (36.7 C) (Oral)    Ht 5\' 5"  (1.651 m)    Wt 218 lb 9.6 oz (99.2 kg)    LMP  (LMP Unknown) Comment: rt ovary removed   SpO2 98%    BMI 36.38 kg/m   GEN: A/Ox3; pleasant , NAD, well nourished    HEENT:  [place]/AT, NOSE-clear, THROAT-clear, no lesions, no postnasal drip or exudate noted.   NECK:  Supple w/ fair ROM; no JVD; normal carotid impulses w/o bruits; no thyromegaly or nodules palpated; no lymphadenopathy.    RESP  Clear  P & A; w/o, wheezes/ rales/ or rhonchi. no accessory muscle use, no dullness to percussion  CARD:  RRR, no m/r/g, no peripheral edema, pulses intact, no cyanosis or clubbing.  GI:   Soft & nt; nml bowel sounds; no organomegaly or masses detected.   Musco: Warm bil, no deformities or joint swelling noted.   Neuro: alert, no focal deficits noted.    Skin: Warm, no lesions or rashes    Lab Results:  CBC  BNP No results found for: BNP  ProBNP No results found for: PROBNP  Imaging: No results found.    No flowsheet data found.  No results found for: NITRICOXIDE      Assessment & Plan:   OSA (obstructive sleep apnea) Moderate OSA -patient is encouraged on CPAP compliance.  Patient education on sleep apnea and CPAP  Plan  Patient Instructions  Wear CPAP at bedtime all night long.  Work on healthy weight loss Do not drive if sleepy Refer to LDCT Chest screening program .  Work on not smoking .  Follow-up in 4-6  months with Dr. Halford Chessman  or APP and As needed         Obesity (BMI 35.0-39.9 without comorbidity) Healthy weight loss.   Tobacco abuse Smoking cessation discussed  Refer to LDCT chest screening program (22 PY )      Rexene Edison, NP 12/30/2020

## 2020-12-30 NOTE — Assessment & Plan Note (Signed)
Moderate OSA -patient is encouraged on CPAP compliance.  Patient education on sleep apnea and CPAP  Plan  Patient Instructions  Wear CPAP at bedtime all night long.  Work on healthy weight loss Do not drive if sleepy Refer to LDCT Chest screening program .  Work on not smoking .  Follow-up in 4-6  months with Dr. Halford Chessman  or APP and As needed

## 2020-12-30 NOTE — Progress Notes (Signed)
Reviewed and agree with assessment/plan.   Chesley Mires, MD [facility] Pulmonary/Critical Care 12/30/2020, 2:37 PM Pager:  [phone]

## 2020-12-30 NOTE — Assessment & Plan Note (Signed)
Healthy weight loss 

## 2020-12-30 NOTE — Patient Instructions (Addendum)
Wear CPAP at bedtime all night long.  Work on healthy weight loss Do not drive if sleepy Refer to LDCT Chest screening program .  Work on not smoking .  Follow-up in 4-6  months with Dr. Halford Chessman  or APP and As needed

## 2020-12-30 NOTE — Telephone Encounter (Signed)
Created in error

## 2021-02-11 ENCOUNTER — Ambulatory Visit: Payer: Self-pay

## 2021-02-11 NOTE — Telephone Encounter (Addendum)
Chief Complaint: Developed symptoms last night, cough, sore throat,runny nose Symptoms: Uses CPAP Frequency: Started last night. Pertinent Negatives: Patient denies fever Disposition: _0 ED /_1 Urgent Care (no appt availability in office) / _2 Appointment(In office/virtual)/ _3  [place] [facility]/ _4 Home Care/ _5 Refused Recommended Disposition /_6 [place] [facility]/ _7  Follow-up with PCP Additional Notes:     Pt stated she tested positive for COVID today, February 11, 2021. Pt seeking clinical advice as if there is anything she may need to do.  PT stated she currently has no symptoms.   Chief Complaint: COVID 19 positive, no symptoms Symptoms: None Frequency:  Pertinent Negatives: Patient denies symptoms Disposition: _8 ED /_9 Urgent Care (no appt availability in office) / _10 Appointment(In office/virtual)/ _11  [place] [facility]/ _12 Home Care/ _13 Refused Recommended Disposition /_14 [facility]/ _15  Follow-up with PCP Additional Notes: Husband positive with symptoms. Instructed to call back if she develops symptoms.   Reason for Disposition  [1] COVID-19 diagnosed by positive lab test (e.g., PCR, rapid self-test kit) AND [2] NO symptoms (e.g., cough, fever, others)  Answer Assessment - Initial Assessment Questions 1. COVID-19 DIAGNOSIS: "Who made your COVID-19 diagnosis?" "Was it confirmed by a positive lab test or self-test?" If not diagnosed by a doctor (or NP/PA), ask "Are there lots of cases (community spread) where you live?" Note: See public health department website, if unsure.     Home test and [facility] 2. COVID-19 EXPOSURE: "Was there any known exposure to COVID before the symptoms began?" CDC Definition of close contact: within 6 feet (2 meters) for a total of 15 minutes or more over a 24-hour period.      Yes 3. ONSET: "When did the COVID-19 symptoms start?"      No symptoms 4. WORST SYMPTOM: "What is your worst symptom?" (e.g., cough, fever,  shortness of breath, muscle aches)     N/a 5. COUGH: "Do you have a cough?" If Yes, ask: "How bad is the cough?"       N/a 6. FEVER: "Do you have a fever?" If Yes, ask: "What is your temperature, how was it measured, and when did it start?"     N/a 7. RESPIRATORY STATUS: "Describe your breathing?" (e.g., shortness of breath, wheezing, unable to speak)      N/a 8. BETTER-SAME-WORSE: "Are you getting better, staying the same or getting worse compared to yesterday?"  If getting worse, ask, "In what way?"     Same 9. HIGH RISK DISEASE: "Do you have any chronic medical problems?" (e.g., asthma, heart or lung disease, weak immune system, obesity, etc.)     No 10. VACCINE: "Have you had the COVID-19 vaccine?" If Yes, ask: "Which one, how many shots, when did you get it?"       N/a 11. BOOSTER: "Have you received your COVID-19 booster?" If Yes, ask: "Which one and when did you get it?"       N/a 12. PREGNANCY: "Is there any chance you are pregnant?" "When was your last menstrual period?"       No 13. OTHER SYMPTOMS: "Do you have any other symptoms?"  (e.g., chills, fatigue, headache, loss of smell or taste, muscle pain, sore throat)       No 14. O2 SATURATION MONITOR:  "Do you use an oxygen saturation monitor (pulse oximeter) at home?" If Yes, ask "What is your reading (oxygen level) today?" "What is your usual oxygen saturation reading?" (e.g., 95%)       No  Protocols used: Coronavirus (COVID-19) Diagnosed or  [place]

## 2021-02-12 ENCOUNTER — Encounter: Payer: Self-pay | Admitting: Family Medicine

## 2021-02-12 ENCOUNTER — Ambulatory Visit (INDEPENDENT_AMBULATORY_CARE_PROVIDER_SITE_OTHER): Payer: Medicare Other | Admitting: Family Medicine

## 2021-02-12 VITALS — BP 132/78 | HR 65 | Temp 98.0°F | Resp 16 | Ht 65.0 in | Wt 220.0 lb

## 2021-02-12 DIAGNOSIS — G4733 Obstructive sleep apnea (adult) (pediatric): Secondary | ICD-10-CM | POA: Diagnosis not present

## 2021-02-12 DIAGNOSIS — E669 Obesity, unspecified: Secondary | ICD-10-CM

## 2021-02-12 DIAGNOSIS — U071 COVID-19: Secondary | ICD-10-CM

## 2021-02-12 MED ORDER — NIRMATRELVIR/RITONAVIR (PAXLOVID)TABLET: 3.0000 | ORAL_TABLET | Freq: Two times a day (BID) | ORAL | 0 refills | Status: AC

## 2021-02-12 NOTE — Progress Notes (Signed)
Outpatient Oral COVID Treatment Note   Diagnosis: COVID-19 infection  Purpose of visit: Discussion of potential use of Molnupiravir or Paxlovid, a new treatment for mild to moderate COVID-19 viral infection in non-hospitalized patients.   Subjective: Patient is a 71 y.o. female who has been diagnosed with COVID 19 viral infection.  Their symptoms began on 02/11/2021 with headache last night. Today feeling tired, body aches, chills, loose stools this morning, lack of appetite, scratchy throat, she also has cough.  She states husband developed symptoms earlier this week and she got tested at [facility] yesterday and her test was positive   Past Medical History:  Diagnosis Date   Bursitis of left hip    Depression    Depression    Diverticulitis    Hepatic abscess 01/18/2019   Hip pain, chronic, left 03/18/2017   Hypertension    Joint pain    Lower back pain 03/18/2017   Obesity    Osteopenia 04/20/2017   April 2019; next DEXA April 2021   Osteoporosis    Prediabetes    Reflux    Sacroiliac joint pain 03/18/2017   Tobacco use     Allergies  Allergen Reactions   Adhesive [Tape]     Rash/dermatitis     Current Outpatient Medications:    Cyanocobalamin (B-12) 500 MCG TABS, Take 2,500 mcg by mouth daily. , Disp: , Rfl:    diclofenac Sodium (VOLTAREN) 1 % GEL, Apply 2 g topically 4 (four) times daily as needed (for chronic pain management)., Disp: 100 g, Rfl: 5   Gluc-Chonn-MSM-Boswellia-Vit D (GLUCOSAMINE CHONDROITIN + D3 PO), Take by mouth., Disp: , Rfl:    Multiple Vitamin (MULTIVITAMIN) tablet, Take 1 tablet by mouth daily. Centrum Silver, Disp: , Rfl:    Omega-3 Fatty Acids (FISH OIL PO), Take 1,200 mg daily by mouth. , Disp: , Rfl:   Vitals:   02/12/21 1021  BP: 132/78  Pulse: 65  Resp: 16  Temp: 98 F (36.7 C)  SpO2: 92%    Constitutional: Patient appears well-developed and well-nourished. Obese  No distress.  HEENT: head atraumatic, normocephalic, pupils equal and  reactive to light, , neck supple Cardiovascular: Normal rate, regular rhythm and normal heart sounds.  No murmur heard. No BLE edema. Pulmonary/Chest: Effort normal and breath sounds normal. No respiratory distress. Abdominal: Soft.  There is no tenderness. Psychiatric: Patient has a normal mood and affect. behavior is normal. Judgment and thought content normal.    Assessment: 71 y.o. female with mild/moderate COVID 19 viral infection diagnosed on 02/12/2021  at high risk for progression to severe COVID 19.  Plan:  This patient is a 71 y.o. female that meets the following criteria for Emergency Use Authorization of: Paxlovid 1. Age >12 yr AND > 40 kg 2. SARS-COV-2 positive test 3. Symptom onset < 5 days 4. Mild-to-moderate COVID disease with high risk for severe progression to hospitalization or death  I have spoken and communicated the following to the patient or parent/caregiver regarding: Paxlovid is an unapproved drug that is authorized for use under an Emergency Use Authorization.  There are no adequate, approved, available products for the treatment of COVID-19 in adults who have mild-to-moderate COVID-19 and are at high risk for progressing to severe COVID-19, including hospitalization or death. Other therapeutics are currently authorized. For additional information on all products authorized for treatment or prevention of COVID-19, please see [employer].  There are benefits and risks of taking this treatment as outlined in the Fact Sheet for Patients  and Caregivers.  Fact Sheet for Patients and Caregivers was reviewed with patient. A hard copy will be provided to patient from pharmacy prior to the patient receiving treatment. Patients should continue to self-isolate and use infection control measures (e.g., wear mask, isolate, social distance, avoid sharing personal  items, clean and disinfect high touch surfaces, and frequent handwashing) according to CDC guidelines.  The patient or parent/caregiver has the option to accept or refuse treatment. Patient medication history was reviewed for potential drug interactions:No drug interactions Patients GFR was calculated to be 64, and they were therefore prescribed Normal dose (GFR>60) - nirmatrelvir 150mg  tab (2 tablet) by mouth twice daily AND ritonavir 100mg  tab (1 tablet) by mouth twice daily  After reviewing above information with the patient, the patient agrees to receive Paxlovid.  Follow up instructions:    Take prescription BID x 5 days as directed Reach out to pharmacist for counseling on medication if desired For concerns regarding further COVID symptoms please follow up with your PCP or urgent care For urgent or life-threatening issues, seek care at your local emergency department  The patient was provided an opportunity to ask questions, and all were answered. The patient agreed with the plan and demonstrated an understanding of the instructions.   Script sent to [facility] in [place]  and opted to pick up RX.  The patient was advised to call their PCP or seek an in-person evaluation if the symptoms worsen or if the condition fails to improve as anticipated.   1. COVID-19  - nirmatrelvir/ritonavir EUA (PAXLOVID) 20 x 150 MG & 10 x 100MG  TABS; Take 3 tablets by mouth 2 (two) times daily for 5 days. (Take nirmatrelvir 150 mg two tablets twice daily for 5 days and ritonavir 100 mg one tablet twice daily for 5 days) Patient GFR is 62  Dispense: 30 tablet; Refill: 0  Discussed importance of getting pulse oximeter   2. OSA (obstructive sleep apnea)  May use saline spray   3. Obesity (BMI 35.0-39.9 without comorbidity)   Increases risk of complications from INOMV-67

## 2021-02-12 NOTE — Patient Instructions (Addendum)
Pulse oximeter- Check oxygen  Vitamin C- 500mg  Vitamin D- 1,000 Zinc- 30-100mg  a day

## 2021-02-13 ENCOUNTER — Ambulatory Visit: Payer: Self-pay

## 2021-02-13 NOTE — Telephone Encounter (Signed)
Pt.notified

## 2021-02-13 NOTE — Telephone Encounter (Signed)
Pt requesting to speak with a nurse regarding the medication nirmatrelvir/ritonavir EUA (PAXLOVID) 20 x 150 MG & 10 x 100MG  TABS.  Pt stated that she has a few concerns about the taste of the medication.     Chief Complaint: Pt. Has a "horrible taste in my mouth from the Paxlovid. I don't know if I should keep taking it." Symptoms: Instructed pt. Some pt.'s complain of bad or metallic taste as a side effect. Frequency: Started yesterday. Pertinent Negatives: Patient denies any other symptoms. Disposition: [] ED /[] Urgent Care (no appt availability in office) / [] Appointment(In office/virtual)/ []  [facility]/ [] Home Care/ [] Refused Recommended Disposition /[] [facility]/ [x]  Follow-up with PCP Additional Notes: Please advise pt.   Answer Assessment - Initial Assessment Questions 1. NAME of MEDICATION: "What medicine are you calling about?"     Paxlovid 2. QUESTION: "What is your question?" (e.g., double dose of medicine, side effect)     Has caused a bad taste in her mouth 3. PRESCRIBING HCP: "Who prescribed it?" Reason: if prescribed by specialist, call should be referred to that group.     Dr. Ancil Boozer 4. SYMPTOMS: "Do you have any symptoms?"     Yes 5. SEVERITY: If symptoms are present, ask "Are they mild, moderate or severe?"     Severe 6. PREGNANCY:  "Is there any chance that you are pregnant?" "When was your last menstrual period?"     No  Protocols used: Medication Question Call-A-AH

## 2021-02-18 ENCOUNTER — Other Ambulatory Visit: Payer: Self-pay | Admitting: Family Medicine

## 2021-02-18 DIAGNOSIS — Z1231 Encounter for screening mammogram for malignant neoplasm of breast: Secondary | ICD-10-CM

## 2021-03-03 ENCOUNTER — Other Ambulatory Visit: Payer: Self-pay

## 2021-03-03 DIAGNOSIS — Z87891 Personal history of nicotine dependence: Secondary | ICD-10-CM

## 2021-03-03 DIAGNOSIS — F17200 Nicotine dependence, unspecified, uncomplicated: Secondary | ICD-10-CM

## 2021-03-10 ENCOUNTER — Other Ambulatory Visit: Payer: Self-pay

## 2021-03-10 ENCOUNTER — Encounter: Payer: Self-pay | Admitting: Acute Care

## 2021-03-10 ENCOUNTER — Ambulatory Visit (INDEPENDENT_AMBULATORY_CARE_PROVIDER_SITE_OTHER): Payer: Medicare Other | Admitting: Acute Care

## 2021-03-10 DIAGNOSIS — F17210 Nicotine dependence, cigarettes, uncomplicated: Secondary | ICD-10-CM | POA: Diagnosis not present

## 2021-03-10 NOTE — Progress Notes (Signed)
Virtual Visit via Telephone Note  I connected with Allison Phillips on 03/10/21 at 10:00 AM EST by telephone and verified that I am speaking with the correct person using two identifiers.  Location: Patient:  At home Provider:  [place], [place], [place], [address]    I discussed the limitations, risks, security and privacy concerns of performing an evaluation and management service by telephone and the availability of in person appointments. I also discussed with the patient that there may be a patient responsible charge related to this service. The patient expressed understanding and agreed to proceed.   Shared Decision Making Visit Lung Cancer Screening Program [phone])   Eligibility: Age 71 y.o. Pack Years Smoking History Calculation 24 pack year smoking history (# packs/per year x # years smoked) Recent History of coughing up blood  no Unexplained weight loss? no ( >Than 15 pounds within the last 6 months ) Prior History Lung / other cancer no (Diagnosis within the last 5 years already requiring surveillance chest CT Scans). Smoking Status Current Smoker Former Smokers: Years since quit: NA  Quit Date:  NA  Visit Components: Discussion included one or more decision making aids. yes Discussion included risk/benefits of screening. yes Discussion included potential follow up diagnostic testing for abnormal scans. yes Discussion included meaning and risk of over diagnosis. yes Discussion included meaning and risk of False Positives. yes Discussion included meaning of total radiation exposure. yes  Counseling Included: Importance of adherence to annual lung cancer LDCT screening. yes Impact of comorbidities on ability to participate in the program. yes Ability and willingness to under diagnostic treatment. yes  Smoking Cessation Counseling: Current Smokers:  Discussed importance of smoking cessation. yes Information about tobacco cessation classes and  interventions provided to patient. yes Patient provided with "ticket" for LDCT Scan. yes Symptomatic Patient. no  Counseling NA Diagnosis Code: Tobacco Use Z72.0 Asymptomatic Patient no  Counseling (Intermediate counseling: > three minutes counseling) S1779 Former Smokers:  Discussed the importance of maintaining cigarette abstinence. yes Diagnosis Code: Personal History of Nicotine Dependence. T90.300 Information about tobacco cessation classes and interventions provided to patient. Yes Patient provided with "ticket" for LDCT Scan. yes Written Order for Lung Cancer Screening with LDCT placed in Epic. Yes (CT Chest Lung Cancer Screening Low Dose W/O CM) [id number] Z12.2-Screening of respiratory organs Z87.891-Personal history of nicotine dependence  I have spent 25 minutes of face to face/ virtual visit   time with  Allison Phillips discussing the risks and benefits of lung cancer screening. We viewed / discussed a power point together that explained in detail the above noted topics. We paused at intervals to allow for questions to be asked and answered to ensure understanding.We discussed that the single most powerful action that she  can take to decrease her risk of developing lung cancer is to quit smoking. We discussed whether or not she is ready to commit to setting a quit date. We discussed options for tools to aid in quitting smoking including nicotine replacement therapy, non-nicotine medications, support groups, Quit Smart classes, and behavior modification. We discussed that often times setting smaller, more achievable goals, such as eliminating 1 cigarette a day for a week and then 2 cigarettes a day for a week can be helpful in slowly decreasing the number of cigarettes smoked. This allows for a sense of accomplishment as well as providing a clinical benefit. I provided  her  with smoking cessation  information  with contact information for community resources, classes,  free nicotine replacement  therapy, and access to mobile apps, text messaging, and on-line smoking cessation help. I have also provided  her  the office contact information in the event she needs to contact me, or the screening staff. We discussed the time and location of the scan, and that either Allison Glassman RN, Allison Prince, RN  or I will call / send a letter with the results within 24-72 hours of receiving them. The patient verbalized understanding of all of  the above and had no further questions upon leaving the office. They have my contact information in the event they have any further questions.  I spent 3 minutes counseling on smoking cessation and the health risks of continued tobacco abuse.  I explained to the patient that there has been a high incidence of coronary artery disease noted on these exams. I explained that this is a non-gated exam therefore degree or severity cannot be determined. This patient is not on statin therapy. I have asked the patient to follow-up with their PCP regarding any incidental finding of coronary artery disease and management with diet or medication as their PCP  feels is clinically indicated. The patient verbalized understanding of the above and had no further questions upon completion of the visit.      Allison Spatz, NP 03/10/2021

## 2021-03-10 NOTE — Patient Instructions (Signed)
Thank you for participating in the [facility] Screening Program. °It was our pleasure to meet you today. °We will call you with the results of your scan within the next few days. °Your scan will be assigned a Lung RADS category score by the physicians reading the scans.  °This Lung RADS score determines follow up scanning.  °See below for description of categories, and follow up screening recommendations. °We will be in touch to schedule your follow up screening annually or based on recommendations of our providers. °We will fax a copy of your scan results to your Primary Care Physician, or the physician who referred you to the program, to ensure they have the results. °Please call the office if you have any questions or concerns regarding your scanning experience or results.  °Our office number is [phone]. °Please speak with Denise Phelps, RN. She is our Lung Cancer Screening RN. °If she is unavailable when you call, please have the office staff send her a message. She will return your call at her earliest convenience. °Remember, if your scan is normal, we will scan you annually as long as you continue to meet the criteria for the program. (Age 55-77, Current smoker or smoker who has quit within the last 15 years). °If you are a smoker, remember, quitting is the single most powerful action that you can take to decrease your risk of lung cancer and other pulmonary, breathing related problems. °We know quitting is hard, and we are here to help.  °Please let us know if there is anything we can do to help you meet your goal of quitting. °If you are a former smoker, congratulations. We are proud of you! Remain smoke free! °Remember you can refer friends or family members through the number above.  °We will screen them to make sure they meet criteria for the program. °Thank you for helping us take better care of you by participating in Lung Screening. ° °You can receive free nicotine replacement therapy  ( patches, gum or mints) by calling 1-800-QUIT NOW. Please call so we can get you on the path to becoming  a non-smoker. I know it is hard, but you can do this! ° °Lung RADS Categories: ° °Lung RADS 1: no nodules or definitely non-concerning nodules.  °Recommendation is for a repeat annual scan in 12 months. ° °Lung RADS 2:  nodules that are non-concerning in appearance and behavior with a very low likelihood of becoming an active cancer. °Recommendation is for a repeat annual scan in 12 months. ° °Lung RADS 3: nodules that are probably non-concerning , includes nodules with a low likelihood of becoming an active cancer.  Recommendation is for a 6-month repeat screening scan. Often noted after an upper respiratory illness. We will be in touch to make sure you have no questions, and to schedule your 6-month scan. ° °Lung RADS 4 A: nodules with concerning findings, recommendation is most often for a follow up scan in 3 months or additional testing based on our provider's assessment of the scan. We will be in touch to make sure you have no questions and to schedule the recommended 3 month follow up scan. ° °Lung RADS 4 B:  indicates findings that are concerning. We will be in touch with you to schedule additional diagnostic testing based on our provider's  assessment of the scan. ° °Hypnosis for smoking cessation  °[employer] °[phone] ° °Acupuncture for smoking cessation  °[facility] °[phone]  °

## 2021-03-11 ENCOUNTER — Ambulatory Visit: Payer: Medicare Other

## 2021-03-11 ENCOUNTER — Ambulatory Visit
Admission: RE | Admit: 2021-03-11 | Discharge: 2021-03-11 | Disposition: A | Discharge status: Home / Self Care | Payer: Medicare Other | Source: Ambulatory Visit | Attending: Acute Care | Admitting: Acute Care

## 2021-03-11 ENCOUNTER — Other Ambulatory Visit: Payer: Self-pay

## 2021-03-11 DIAGNOSIS — Z87891 Personal history of nicotine dependence: Secondary | ICD-10-CM | POA: Diagnosis present

## 2021-03-11 DIAGNOSIS — F17200 Nicotine dependence, unspecified, uncomplicated: Secondary | ICD-10-CM | POA: Insufficient documentation

## 2021-03-16 ENCOUNTER — Telehealth: Payer: Self-pay | Admitting: Acute Care

## 2021-03-16 ENCOUNTER — Other Ambulatory Visit: Payer: Self-pay

## 2021-03-16 DIAGNOSIS — F17210 Nicotine dependence, cigarettes, uncomplicated: Secondary | ICD-10-CM

## 2021-03-16 DIAGNOSIS — Z87891 Personal history of nicotine dependence: Secondary | ICD-10-CM

## 2021-03-16 NOTE — Telephone Encounter (Signed)
Spoke to patient. She is requesting CT low dose results. She would like a call back on  [phone]

## 2021-03-16 NOTE — Telephone Encounter (Signed)
Results called to patient.  No suspicious findings for lung cancer.  Findings notes emphysema and atherosclerosis.  Patient takes fish oil but not on statin.  States lipids are normally noted as "ok".  Patient questioned cyst notation on liver.  States she had an infection several years ago and had an area drained.  She wondered if this could be related.  Advised this would be a good discussion with her PCP who knows her history.  It may or may not be related but radiology review does not find it concerning at this time.  Patient acknowledged understanding and had no further questions.  Advised results will be sent to PCP.

## 2021-03-24 ENCOUNTER — Ambulatory Visit
Admission: RE | Admit: 2021-03-24 | Discharge: 2021-03-24 | Disposition: A | Discharge status: Home / Self Care | Payer: Medicare Other | Source: Ambulatory Visit | Attending: Family Medicine | Admitting: Family Medicine

## 2021-03-24 ENCOUNTER — Ambulatory Visit (INDEPENDENT_AMBULATORY_CARE_PROVIDER_SITE_OTHER): Payer: Medicare Other | Admitting: Family Medicine

## 2021-03-24 ENCOUNTER — Encounter: Payer: Self-pay | Admitting: Family Medicine

## 2021-03-24 ENCOUNTER — Other Ambulatory Visit: Payer: Self-pay

## 2021-03-24 VITALS — BP 132/76 | HR 77 | Temp 98.5°F | Resp 16 | Ht 66.0 in | Wt 217.4 lb

## 2021-03-24 DIAGNOSIS — Z1231 Encounter for screening mammogram for malignant neoplasm of breast: Secondary | ICD-10-CM | POA: Diagnosis not present

## 2021-03-24 DIAGNOSIS — H538 Other visual disturbances: Secondary | ICD-10-CM | POA: Diagnosis not present

## 2021-03-24 DIAGNOSIS — R7303 Prediabetes: Secondary | ICD-10-CM

## 2021-03-24 NOTE — Assessment & Plan Note (Addendum)
Few months duration with unclear etiology. Association with intermittent photophobia, flashing lights, headache. No obvious abnormalities on exam, no obvious retinal detachment on bedside ultrasound but with decreased visual acuity. Explained symptoms not characteristic of new onset diabetes, however will check a1c. Refer to optho for evaluation. Emergent precautions discussed.  ?

## 2021-03-24 NOTE — Patient Instructions (Signed)
It was great to see you! ? ?Our plans for today:  ?- We are referring to ophthalmology. Let us know if you don't hear about an appointment in a few days. ?- If you start to see dark spots, loss of vision, or a shade come down across your vision, go to the Emergency Department.  ? ?We are checking some labs today, we will release these results to your MyChart. ? ?Take care and seek immediate care sooner if you develop any concerns.  ? ?Dr. Ky Barban ? ?

## 2021-03-24 NOTE — Progress Notes (Signed)
? ?  SUBJECTIVE:  ? ?CHIEF COMPLAINT / HPI:  ? ?EYE COMPLAINT ?- reports few episodes of seeing bright, flashing lights with accompanying headache, off and on the past few months with worst episode this morning, lasted 10 minutes.  ?- also noticing some blurry vision.  ?- last saw eye doctor ([facility]) few months ago with new glasses. ?- does not see dark spots but will see different shapes ?- concerned she may have diabetes as she read diabetes can affect vision. ?Duration:  months ?Involved eye:  bilateral ?Onset: sudden ?Quality: no pain ?Foreign body sensation:no ?Visual impairment: yes, some blurry vision ?Eye redness: no ?Discharge: no ?Swelling: no ?Photophobia: yes ?Itching: no ?Tearing: no ?Headache: yes ?Floaters: yes ?URI symptoms: no ?Contact lens use: no ?Eye trauma: no ?Treatments attempted: none ? ? ? ?OBJECTIVE:  ? ?BP 132/76   Pulse 77   Temp 98.5 ?F (36.9 ?C) (Oral)   Resp 16   Ht '5\' 6"'$  (1.676 m)   Wt 217 lb 6.4 oz (98.6 kg)   LMP  (LMP Unknown) Comment: rt ovary removed  SpO2 95%   BMI 35.09 kg/m?   ? ?Vision Screening  ? Right eye Left eye Both eyes  ?Without correction     ?With correction '20/50 20/50 20/40 '$  ? ?Gen: well appearing, in NAD ?HEENT: PERRL, EOMI. Optic field normal. Fundoscopic exam without obvious abnormalities.  ?Card: RRR ?Lungs: CTAB ?Ext: WWP, no edema ? ?Limited Ultrasound of bilateral eyes: ?Findings and Impression: negative for obvious retinal detachment. ? ? ?ASSESSMENT/PLAN:  ? ?Blurry vision, bilateral ?Few months duration with unclear etiology. Association with intermittent photophobia, flashing lights, headache. No obvious abnormalities on exam, no obvious retinal detachment on bedside ultrasound but with decreased visual acuity. Explained symptoms not characteristic of new onset diabetes, however will check a1c. Refer to optho for evaluation. Emergent precautions discussed.  ?  ? ? ?Myles Gip, DO ?

## 2021-03-25 LAB — HEMOGLOBIN A1C
Hgb A1c MFr Bld: 5.7 % of total Hgb — ABNORMAL HIGH (ref <5.7)
Mean Plasma Glucose: 117 mg/dL
eAG (mmol/L): 6.5 mmol/L

## 2021-05-21 ENCOUNTER — Ambulatory Visit: Payer: Medicare Other | Admitting: Pulmonary Disease

## 2021-05-26 ENCOUNTER — Encounter: Payer: Self-pay | Admitting: Family Medicine

## 2021-05-26 ENCOUNTER — Ambulatory Visit (INDEPENDENT_AMBULATORY_CARE_PROVIDER_SITE_OTHER): Payer: Medicare Other | Admitting: Family Medicine

## 2021-05-26 VITALS — BP 126/84 | HR 71 | Temp 98.5°F | Resp 16 | Ht 66.0 in | Wt 219.4 lb

## 2021-05-26 DIAGNOSIS — J0110 Acute frontal sinusitis, unspecified: Secondary | ICD-10-CM

## 2021-05-26 DIAGNOSIS — J069 Acute upper respiratory infection, unspecified: Secondary | ICD-10-CM

## 2021-05-26 DIAGNOSIS — I700 Atherosclerosis of aorta: Secondary | ICD-10-CM

## 2021-05-26 DIAGNOSIS — R051 Acute cough: Secondary | ICD-10-CM | POA: Diagnosis not present

## 2021-05-26 MED ORDER — BENZONATATE 100 MG PO CAPS: 100.0000 mg | ORAL_CAPSULE | Freq: Three times a day (TID) | ORAL | 0 refills | Status: DC | PRN

## 2021-05-26 MED ORDER — AMOXICILLIN-POT CLAVULANATE 875-125 MG PO TABS: 1.0000 | ORAL_TABLET | Freq: Two times a day (BID) | ORAL | 0 refills | Status: AC

## 2021-05-26 MED ORDER — LEVOCETIRIZINE DIHYDROCHLORIDE 5 MG PO TABS: 5.0000 mg | ORAL_TABLET | Freq: Every evening | ORAL | 1 refills | Status: DC

## 2021-05-26 NOTE — Patient Instructions (Signed)
Please get and take a daily antihistamine and do a steroid nasal spray and saline nasal spray ? ?Use the cough meds and other over the counter cough medicine ? ?If your symptoms continue then start the antibiotic in 2 days (will have been 10 days since your symptoms started) ?If you suddenly get worse you may also start the antibiotics ?

## 2021-05-26 NOTE — Progress Notes (Signed)
? ? ?Patient ID: Allison Phillips, female    DOB: 1950-07-11, 71 y.o.   MRN: [id number] ? ?PCP: Delsa Grana, PA-C ? ?Chief Complaint  ?Patient presents with  ?? URI  ?  X1 week  ? ? ?Subjective:  ? ?Allison Phillips is a 71 y.o. female, presents to clinic with CC of the following: ? ?HPI  ?Sinus congestion, drainage, sore throat onset 1 week ago ?Cough, sometimes productive ?Sneezing profuse drainage scratchy throat sore right neck pain ?Chills when sx started ?No fever ? ?She has tried multiple OTC meds including Sudafed, robitussin ?Sx worsening and she feels generally ill and tired ? ?Patient Active Problem List  ? Diagnosis Date Noted  ?? Blurry vision, bilateral 03/24/2021  ?? OSA (obstructive sleep apnea) 09/23/2020  ?? Aortic atherosclerosis ([place]) 11/14/2019  ?? Obesity (BMI 35.0-39.9 without comorbidity) 01/06/2018  ?? Osteopenia 04/20/2017  ?? Morbid obesity ([place]) 12/30/2016  ?? Essential hypertension, benign 03/08/2016  ?? Tobacco abuse   ? ? ? ? ?Current Outpatient Medications:  ??  Cyanocobalamin (B-12) 500 MCG TABS, Take 2,500 mcg by mouth daily. , Disp: , Rfl:  ??  diclofenac Sodium (VOLTAREN) 1 % GEL, Apply 2 g topically 4 (four) times daily as needed (for chronic pain management)., Disp: 100 g, Rfl: 5 ??  Gluc-Chonn-MSM-Boswellia-Vit D (GLUCOSAMINE CHONDROITIN + D3 PO), Take by mouth., Disp: , Rfl:  ??  Multiple Vitamin (MULTIVITAMIN) tablet, Take 1 tablet by mouth daily. Centrum Silver, Disp: , Rfl:  ??  Omega-3 Fatty Acids (FISH OIL PO), Take 1,200 mg daily by mouth. , Disp: , Rfl:  ? ? ?Allergies  ?Allergen Reactions  ?? Adhesive [Tape]   ?  Rash/dermatitis  ? ? ? ?Social History  ? ?Tobacco Use  ?? Smoking status: Every Day  ?  Packs/day: 0.50  ?  Years: 15.00  ?  Pack years: 7.50  ?  Types: Cigarettes  ?  Last attempt to quit: 10/24/2016  ?  Years since quitting: 4.5  ?? Smokeless tobacco: Never  ?? Tobacco comments:  ?  smoking cessation information provided; pt working on cutting down  ?    ?   0.25ppd-12/30/2020  ?Vaping Use  ?? Vaping Use: Never used  ?Substance Use Topics  ?? Alcohol use: Yes  ?  Alcohol/week: 4.0 standard drinks  ?  Types: 4 Standard drinks or equivalent per week  ?  Comment: on occasion  ?? Drug use: No  ?  ? ? ?Chart Review Today: ?I personally reviewed active problem list, medication list, allergies, family history, social history, health maintenance, notes from last encounter, lab results, imaging with the patient/caregiver today. ? ? ?Review of Systems  ?Constitutional: Negative.   ?HENT: Negative.    ?Eyes: Negative.   ?Respiratory: Negative.    ?Cardiovascular: Negative.   ?Gastrointestinal: Negative.   ?Endocrine: Negative.   ?Genitourinary: Negative.   ?Musculoskeletal: Negative.   ?Skin: Negative.   ?Allergic/Immunologic: Negative.   ?Neurological: Negative.   ?Hematological: Negative.   ?Psychiatric/Behavioral: Negative.    ?All other systems reviewed and are negative. ? ?   ?Objective:  ? ?Vitals:  ? 05/26/21 0910  ?BP: 126/84  ?Pulse: 71  ?Resp: 16  ?Temp: 98.5 ?F (36.9 ?C)  ?TempSrc: Oral  ?SpO2: 95%  ?Weight: 219 lb 6.4 oz (99.5 kg)  ?Height: '5\' 6"'$  (1.676 m)  ?  ?Body mass index is 35.41 kg/m?. ? ?Physical Exam ?Vitals and nursing note reviewed.  ?Constitutional:   ?   General: She is not  in acute distress. ?   Appearance: She is well-developed. She is not ill-appearing, toxic-appearing or diaphoretic.  ?HENT:  ?   Head: Normocephalic and atraumatic.  ?   Right Ear: Hearing, tympanic membrane, ear canal and external ear normal. There is no impacted cerumen.  ?   Left Ear: Hearing, tympanic membrane, ear canal and external ear normal. There is no impacted cerumen.  ?   Nose: Mucosal edema, congestion and rhinorrhea present.  ?   Right Sinus: No maxillary sinus tenderness or frontal sinus tenderness.  ?   Left Sinus: No maxillary sinus tenderness or frontal sinus tenderness.  ?   Mouth/Throat:  ?   Mouth: Mucous membranes are moist. Mucous membranes are not pale.  ?    Pharynx: Oropharynx is clear. Uvula midline. Posterior oropharyngeal erythema present. No oropharyngeal exudate or uvula swelling.  ?   Tonsils: No tonsillar abscesses.  ?Eyes:  ?   General: No scleral icterus.    ?   Right eye: No discharge.     ?   Left eye: No discharge.  ?   Conjunctiva/sclera: Conjunctivae normal.  ?Neck:  ?   Trachea: No tracheal deviation.  ?Cardiovascular:  ?   Rate and Rhythm: Normal rate and regular rhythm.  ?   Pulses: Normal pulses.  ?   Heart sounds: Normal heart sounds. No murmur heard. ?  No friction rub. No gallop.  ?Pulmonary:  ?   Effort: Pulmonary effort is normal. No respiratory distress.  ?   Breath sounds: Normal breath sounds. No stridor. No wheezing, rhonchi or rales.  ?Abdominal:  ?   General: Bowel sounds are normal. There is no distension.  ?   Palpations: Abdomen is soft.  ?Musculoskeletal:     ?   General: Normal range of motion.  ?   Cervical back: Normal range of motion and neck supple.  ?Lymphadenopathy:  ?   Cervical: No cervical adenopathy.  ?Skin: ?   General: Skin is warm and dry.  ?   Coloration: Skin is not jaundiced or pale.  ?   Findings: No lesion or rash.  ?Neurological:  ?   Mental Status: She is alert.  ?   Motor: No abnormal muscle tone.  ?   Coordination: Coordination normal.  ?Psychiatric:     ?   Behavior: Behavior normal.  ?  ? ?Results for orders placed or performed in visit on 03/24/21  ?Hemoglobin A1c  ?Result Value Ref Range  ? Hgb A1c MFr Bld 5.7 (H) <5.7 % of total Hgb  ? Mean Plasma Glucose 117 mg/dL  ? eAG (mmol/L) 6.5 mmol/L  ? ? ?   ?Assessment & Plan:  ? ?Problem List Items Addressed This Visit   ? ?  ? Cardiovascular and Mediastinum  ? Aortic atherosclerosis ([place])  ?  Pt refuses statin, monitoring lipids and ASCVD risk, asx ? ?  ?  ?  ? Other  ? Morbid obesity ([place])  ?  BMI 35+ with comorbidities of OSA, atherosclerosis, HLD, HTN ? ?  ?  ? ?Other Visit Diagnoses   ? ? Upper respiratory tract infection, unspecified type    -  Primary  ?  Relevant Medications  ? benzonatate (TESSALON) 100 MG capsule  ? levocetirizine (XYZAL ALLERGY 24HR) 5 MG tablet  ? Acute cough      ? Relevant Medications  ? benzonatate (TESSALON) 100 MG capsule  ? Acute frontal sinusitis, recurrence not specified      ? Relevant Medications  ?  benzonatate (TESSALON) 100 MG capsule  ? amoxicillin-clavulanate (AUGMENTIN) 875-125 MG tablet  ? levocetirizine (XYZAL ALLERGY 24HR) 5 MG tablet  ? ?  ? ? ?Pt with URI sx over a week, lungs clear, doubt strep throat, no sig sinus ttp today, pt may be able to continue supportive tx, if sx continue or acutely worsen abx for acute bacterial sinusitis would be indicated/appropriate  ?Pt given plan - add more consistent supportive OTC meds, decongestants, add antihistamine - if she improves hold abx.  ? ? ? ?Delsa Grana, PA-C ?05/26/21 9:29 AM ? ?

## 2021-05-29 NOTE — Assessment & Plan Note (Signed)
BMI 35+ with comorbidities of OSA, atherosclerosis, HLD, HTN ?

## 2021-05-29 NOTE — Assessment & Plan Note (Signed)
Pt refuses statin, monitoring lipids and ASCVD risk, asx ?

## 2021-06-09 ENCOUNTER — Encounter: Payer: Self-pay | Admitting: Adult Health

## 2021-06-09 ENCOUNTER — Ambulatory Visit (INDEPENDENT_AMBULATORY_CARE_PROVIDER_SITE_OTHER): Payer: Medicare Other | Admitting: Adult Health

## 2021-06-09 DIAGNOSIS — E669 Obesity, unspecified: Secondary | ICD-10-CM

## 2021-06-09 DIAGNOSIS — F17210 Nicotine dependence, cigarettes, uncomplicated: Secondary | ICD-10-CM

## 2021-06-09 DIAGNOSIS — Z720 Tobacco use: Secondary | ICD-10-CM

## 2021-06-09 DIAGNOSIS — G4733 Obstructive sleep apnea (adult) (pediatric): Secondary | ICD-10-CM

## 2021-06-09 NOTE — Assessment & Plan Note (Signed)
Moderate obstructive sleep apnea with improved control and compliance on CPAP.  Patient is encouraged to wear her CPAP every single night.  - discussed how weight can impact sleep and risk for sleep disordered breathing - discussed options to assist with weight loss: combination of diet modification, cardiovascular and strength training exercises   - had an extensive discussion regarding the adverse health consequences related to untreated sleep disordered breathing - specifically discussed the risks for hypertension, coronary artery disease, cardiac dysrhythmias, cerebrovascular disease, and diabetes - lifestyle modification discussed   - discussed how sleep disruption can increase risk of accidents, particularly when driving - safe driving practices were discussed   Plan  Patient Instructions  Wear CPAP at bedtime all night long.  Work on healthy weight loss Do not drive if sleepy Work on not smoking .  Continue with yearly lung cancer screenings chest CT Follow-up in 1 year with Dr. Halford Chessman  or APP and As needed

## 2021-06-09 NOTE — Assessment & Plan Note (Signed)
Smoking cessation Continue with lung cancer screening program

## 2021-06-09 NOTE — Assessment & Plan Note (Signed)
Healthy weight loss 

## 2021-06-09 NOTE — Progress Notes (Signed)
$'@Patient'r$  ID: Allison Phillips, female    DOB: 1950-10-18, 71 y.o.   MRN: [id number]  Chief Complaint  Patient presents with   Follow-up    Referring provider: Laurell Roof  HPI: 70 year old female seen for sleep consult July 31, 2020 for snoring and daytime sleepiness found to have moderate obstructive sleep apnea  TEST/EVENTS :  HST 09/04/20 >> AHI 16.9, SpO2 low 90%  Participates in the lung cancer screening program  06/09/2021 Follow up : OSA  Patient presents for a 37-monthfollow-up.  Patient has underlying sleep apnea.  Patient was started on CPAP therapy.  Patient says she is doing better since starting CPAP.  She has less daytime sleepiness and snoring.  Feels more rested and feels that she benefits from CPAP.  CPAP download shows 73% compliance.  Daily average usage at 6 hours.  AHI 1.6.  Patient is on AutoSet 5 to 15 cm H2O.  Daily average pressure at 9 cm H2O.  We discussed smoking cessation.  Patient has tried to quit was actually quit for short period of time.  Encouraged on cessation.  She participates in the lung cancer screening program last CT was March 11, 2021 that showed lung RADS 1 with no suspicious lung nodules. Notable for Emphysema .   Allergies  Allergen Reactions   Adhesive [Tape]     Rash/dermatitis    Immunization History  Administered Date(s) Administered   Fluad Quad(high Dose 65+) 10/19/2018, 10/08/2019, 10/08/2020   Influenza, High Dose Seasonal PF 11/14/2017   Moderna Sars-Covid-2 Vaccination 03/02/2019, 04/02/2019, 11/13/2019, 04/28/2020   Pneumococcal Conjugate-13 12/06/2016   Pneumococcal Polysaccharide-23 11/02/2010, 08/28/2018, 01/19/2019   Td 10/23/2007   Zoster, Live 11/16/2011    Past Medical History:  Diagnosis Date   Bursitis of left hip    Depression    Depression    Diverticulitis    Hepatic abscess 01/18/2019   Hip pain, chronic, left 03/18/2017   Hypertension    Joint pain    Lower back pain 03/18/2017   Obesity     Osteopenia 04/20/2017   April 2019; next DEXA April 2021   Osteoporosis    Prediabetes    Reflux    Sacroiliac joint pain 03/18/2017   Tobacco use     Tobacco History: Social History   Tobacco Use  Smoking Status Every Day   Packs/day: 0.50   Years: 15.00   Pack years: 7.50   Types: Cigarettes   Last attempt to quit: 10/24/2016   Years since quitting: 4.6  Smokeless Tobacco Never  Tobacco Comments   8-10 cigarettes daily- 06/09/2021   Ready to quit: Not Answered Counseling given: Not Answered Tobacco comments: 8-10 cigarettes daily- 06/09/2021   Outpatient Medications Prior to Visit  Medication Sig Dispense Refill   Cyanocobalamin (B-12) 500 MCG TABS Take 2,500 mcg by mouth daily.      diclofenac Sodium (VOLTAREN) 1 % GEL Apply 2 g topically 4 (four) times daily as needed (for chronic pain management). 100 g 5   Gluc-Chonn-MSM-Boswellia-Vit D (GLUCOSAMINE CHONDROITIN + D3 PO) Take by mouth.     levocetirizine (XYZAL ALLERGY 24HR) 5 MG tablet Take 1 tablet (5 mg total) by mouth every evening. (Patient taking differently: Take 5 mg by mouth as needed.) 30 tablet 1   Multiple Vitamin (MULTIVITAMIN) tablet Take 1 tablet by mouth daily. Centrum Silver     Omega-3 Fatty Acids (FISH OIL PO) Take 1,200 mg daily by mouth.      benzonatate (TESSALON) 100 MG  capsule Take 1-2 capsules (100-200 mg total) by mouth 3 (three) times daily as needed for cough. 30 capsule 0   No facility-administered medications prior to visit.     Review of Systems:   Constitutional:   No  weight loss, night sweats,  Fevers, chills, fatigue, or  lassitude.  HEENT:   No headaches,  Difficulty swallowing,  Tooth/dental problems, or  Sore throat,                No sneezing, itching, ear ache, nasal congestion, post nasal drip,   CV:  No chest pain,  Orthopnea, PND, swelling in lower extremities, anasarca, dizziness, palpitations, syncope.   GI  No heartburn, indigestion, abdominal pain, nausea, vomiting,  diarrhea, change in bowel habits, loss of appetite, bloody stools.   Resp: No shortness of breath with exertion or at rest.  No excess mucus, no productive cough,  No non-productive cough,  No coughing up of blood.  No change in color of mucus.  No wheezing.  No chest wall deformity  Skin: no rash or lesions.  GU: no dysuria, change in color of urine, no urgency or frequency.  No flank pain, no hematuria   MS:  No joint pain or swelling.  No decreased range of motion.  No back pain.    Physical Exam  BP 124/62 (BP Location: Left Arm)   Pulse 88   Temp 97.7 F (36.5 C) (Temporal)   Ht '5\' 6"'$  (1.676 m)   Wt 220 lb 9.6 oz (100.1 kg)   LMP  (LMP Unknown) Comment: rt ovary removed  SpO2 97%   BMI 35.61 kg/m   GEN: A/Ox3; pleasant , NAD, well nourished    HEENT:  [place]/AT,  EACs-clear, TMs-wnl, NOSE-clear, THROAT-clear, no lesions, no postnasal drip or exudate noted. Class 2-3MP airway   NECK:  Supple w/ fair ROM; no JVD; normal carotid impulses w/o bruits; no thyromegaly or nodules palpated; no lymphadenopathy.    RESP  Clear  P & A; w/o, wheezes/ rales/ or rhonchi. no accessory muscle use, no dullness to percussion  CARD:  RRR, no m/r/g, no peripheral edema, pulses intact, no cyanosis or clubbing.  GI:   Soft & nt; nml bowel sounds; no organomegaly or masses detected.   Musco: Warm bil, no deformities or joint swelling noted.   Neuro: alert, no focal deficits noted.    Skin: Warm, no lesions or rashes    Lab Results:  CBC   ProBNP No results found for: PROBNP  Imaging: No results found.        View : No data to display.          No results found for: NITRICOXIDE      Assessment & Plan:   OSA (obstructive sleep apnea) Moderate obstructive sleep apnea with improved control and compliance on CPAP.  Patient is encouraged to wear her CPAP every single night.  - discussed how weight can impact sleep and risk for sleep disordered breathing - discussed  options to assist with weight loss: combination of diet modification, cardiovascular and strength training exercises   - had an extensive discussion regarding the adverse health consequences related to untreated sleep disordered breathing - specifically discussed the risks for hypertension, coronary artery disease, cardiac dysrhythmias, cerebrovascular disease, and diabetes - lifestyle modification discussed   - discussed how sleep disruption can increase risk of accidents, particularly when driving - safe driving practices were discussed   Plan  Patient Instructions  Wear CPAP at bedtime all  night long.  Work on healthy weight loss Do not drive if sleepy Work on not smoking .  Continue with yearly lung cancer screenings chest CT Follow-up in 1 year with Dr. Halford Chessman  or APP and As needed        Obesity (BMI 35.0-39.9 without comorbidity) Healthy weight loss  Tobacco abuse Smoking cessation Continue with lung cancer screening program     Rexene Edison, NP 06/09/2021

## 2021-06-09 NOTE — Patient Instructions (Signed)
Wear CPAP at bedtime all night long.  Work on healthy weight loss Do not drive if sleepy Work on not smoking .  Continue with yearly lung cancer screenings chest CT Follow-up in 1 year with Dr. Halford Phillips  or APP and As needed

## 2021-06-17 NOTE — Progress Notes (Signed)
Reviewed and agree with assessment/plan.   Chesley Mires, MD [facility] Pulmonary/Critical Care 06/17/2021, 12:48 PM Pager:  [phone]

## 2021-07-23 ENCOUNTER — Ambulatory Visit: Payer: Medicare Other

## 2021-07-24 IMAGING — DX DG CHEST 1V PORT
1 series · 1 of 1 positions shown · non-contrast
Comparison: Chest radiographs 08/28/2018 and earlier.

CLINICAL DATA: 68-year-old female with cough and chest congestion
with fatigue. Smoker. No known sick contacts.

EXAM:
PORTABLE CHEST 1 VIEW

[chest ap]
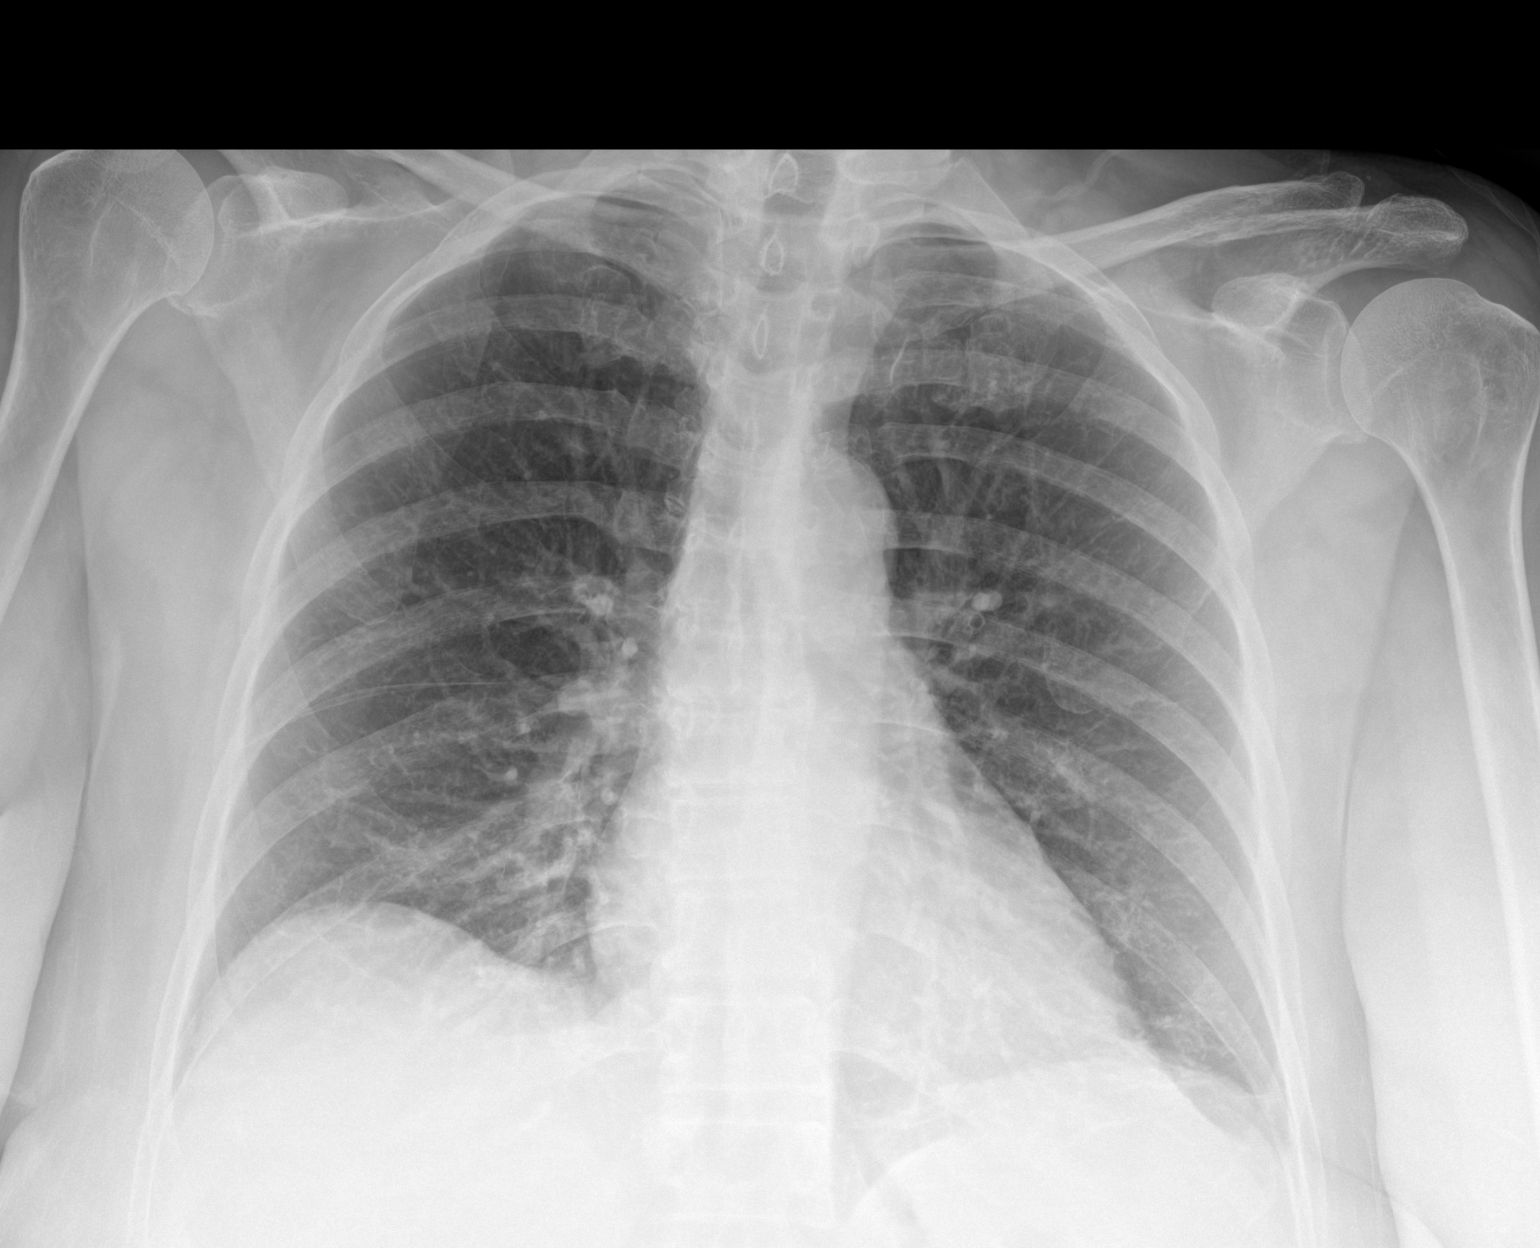

[1 of 1 positions shown; findings below may reference images not displayed]

FINDINGS: Portable AP upright view at 2696 hours. Stable lung volumes.
Mediastinal contours remain normal. Visualized tracheal air column
is within normal limits. Chronically increased pulmonary
interstitial markings, not definitely changed since 6529
radiographs. No pneumothorax or acute pulmonary opacity. No acute
osseous abnormality identified.
IMPRESSION: Chronic interstitial lung changes which are probably smoking
related. No superimposed acute findings are identified.

## 2021-07-29 ENCOUNTER — Ambulatory Visit (INDEPENDENT_AMBULATORY_CARE_PROVIDER_SITE_OTHER): Payer: Medicare Other | Admitting: Family Medicine

## 2021-07-29 ENCOUNTER — Encounter: Payer: Self-pay | Admitting: Family Medicine

## 2021-07-29 DIAGNOSIS — Z0000 Encounter for general adult medical examination without abnormal findings: Secondary | ICD-10-CM

## 2021-07-29 NOTE — Progress Notes (Addendum)
Annual Wellness Visit Telehealth via Phone Visit Time Spent with Patient: 22 minutes    Patient: Allison Phillips, Female    DOB: 13-May-1950, 71 y.o.   MRN: [id number]  Subjective  Chief Complaint  Patient presents with   Medicare Wellness    Allison Phillips is a 71 y.o. female who presents today for her Annual Wellness Visit. She reports consuming a general diet. The patient does not participate in regular exercise at present. She generally feels well. She reports sleeping well. She does not have additional problems to discuss today.     Vision:Within last year and Dental: No current dental problems, Receives regular dental care, and Last dental visit: May 2023   Patient Active Problem List   Diagnosis Date Noted   OSA (obstructive sleep apnea) 09/23/2020   Aortic atherosclerosis ([place]) 11/14/2019   Obesity (BMI 35.0-39.9 without comorbidity) 01/06/2018   Osteopenia 04/20/2017   Morbid obesity ([place]) 12/30/2016   Essential hypertension, benign 03/08/2016   Tobacco abuse       Medications: Outpatient Medications Prior to Visit  Medication Sig   Cyanocobalamin (B-12) 500 MCG TABS Take 2,500 mcg by mouth daily.    Gluc-Chonn-MSM-Boswellia-Vit D (GLUCOSAMINE CHONDROITIN + D3 PO) Take by mouth.   Multiple Vitamin (MULTIVITAMIN) tablet Take 1 tablet by mouth daily. Centrum Silver   Omega-3 Fatty Acids (FISH OIL PO) Take 1,200 mg daily by mouth.    diclofenac Sodium (VOLTAREN) 1 % GEL Apply 2 g topically 4 (four) times daily as needed (for chronic pain management). (Patient not taking: Reported on 07/29/2021)   [DISCONTINUED] levocetirizine (XYZAL ALLERGY 24HR) 5 MG tablet Take 1 tablet (5 mg total) by mouth every evening. (Patient not taking: Reported on 07/29/2021)   No facility-administered medications prior to visit.    Allergies  Allergen Reactions   Adhesive [Tape]     Rash/dermatitis    Patient Care Team: Delsa Grana, PA-C as PCP - General (Family Medicine) Hessie Knows, MD as Consulting Physician (Orthopedic Surgery) Renata Caprice as Physician Assistant (Orthopedic Surgery)  ROS      Objective  LMP  (LMP Unknown) Comment: rt ovary removed   Physical Exam Entirety of visit conducted over the phone. Speaks in full sentences, no respiratory distress.    Most recent functional status assessment:    07/29/2021    8:10 AM  In your present state of health, do you have any difficulty performing the following activities:  Hearing? 0  Vision? 1  Difficulty concentrating or making decisions? 0  Walking or climbing stairs? 0  Dressing or bathing? 0  Doing errands, shopping? 0   Most recent fall risk assessment:    07/29/2021    8:09 AM  Fall Risk   Falls in the past year? 0  Number falls in past yr: 0  Injury with Fall? 0  Risk for fall due to : No Fall Risks  Follow up Falls prevention discussed    Most recent depression screenings:    07/29/2021    8:10 AM 05/26/2021    9:12 AM  PHQ 2/9 Scores  PHQ - 2 Score 2 0  PHQ- 9 Score 2    Most recent cognitive screening:    07/29/2021    8:26 AM  6CIT Screen  What Year? 0 points  What month? 0 points  What time? 0 points  Count back from 20 0 points  Months in reverse 0 points  Repeat phrase 0 points  Total Score 0  points   Most recent Audit-C alcohol use screening    07/29/2021    8:26 AM  Alcohol Use Disorder Test (AUDIT)  1. How often do you have a drink containing alcohol? 2  2. How many drinks containing alcohol do you have on a typical day when you are drinking? 0  3. How often do you have six or more drinks on one occasion? 0  AUDIT-C Score 2   A score of 3 or more in women, and 4 or more in men indicates increased risk for alcohol abuse, EXCEPT if all of the points are from question 1   Vision/Hearing Screen: No results found.    No results found for any visits on 07/29/21.    Assessment & Plan   Annual wellness visit done today including the all  of the following: Reviewed patient's Family Medical History Reviewed and updated list of patient's medical providers Assessment of cognitive impairment was done Assessed patient's functional ability Established a written schedule for health screening [place] Completed and Reviewed  Exercise Activities and Dietary recommendations  Goals      DIET - INCREASE WATER INTAKE     Recommend drinking 6-8 glasses of water per day     Quit smoking / using tobacco     Weight (lb) < 200 lb (90.7 kg)        Immunization History  Administered Date(s) Administered   Fluad Quad(high Dose 65+) 10/19/2018, 10/08/2019, 10/08/2020   Influenza, High Dose Seasonal PF 11/14/2017   Moderna Sars-Covid-2 Vaccination 03/02/2019, 04/02/2019, 11/13/2019, 04/28/2020   Pneumococcal Conjugate-13 12/06/2016   Pneumococcal Polysaccharide-23 11/02/2010, 08/28/2018, 01/19/2019   Td 10/23/2007   Zoster, Live 11/16/2011    Health Maintenance  Topic Date Due   TETANUS/TDAP  10/22/2017   COVID-19 Vaccine (5 - Moderna series) 06/23/2020   Zoster Vaccines- Shingrix (1 of 2) 08/26/2021 (Originally 02/05/2001)   INFLUENZA VACCINE  08/18/2021   MAMMOGRAM  03/25/2022   DEXA SCAN  10/24/2022   COLONOSCOPY (Pts 45-9yr Insurance coverage will need to be confirmed)  02/18/2023   Pneumonia Vaccine 70 Years old  Completed   Hepatitis C Screening  Completed   HPV VACCINES  Aged Out   Advanced Care Planning: A voluntary discussion about advance care planning including the explanation and discussion of advance directives.  Discussed health care proxy and Living will, and the patient was able to identify a health care proxy as husband, Allison Phillips  Patient does not have a living will at present time. If patient does have living will, I have requested they bring this to the clinic to be scanned in to their chart.  Discussed health benefits of physical activity, and encouraged her to engage in  regular exercise appropriate for her age and condition.    Problem List Items Addressed This Visit   None Visit Diagnoses     Encounter for Medicare annual wellness exam    -  Primary       Return in about 1 year (around 07/30/2022) for medicare wellness.     AMyles Gip DO

## 2021-07-29 NOTE — Patient Instructions (Signed)
  Allison Phillips , Thank you for taking time to come for your Medicare Wellness Visit. I appreciate your ongoing commitment to your health goals. Please review the following plan we discussed and let me know if I can assist you in the future.   These are the goals we discussed:  Goals      DIET - INCREASE WATER INTAKE     Recommend drinking 6-8 glasses of water per day     Quit smoking / using tobacco     Weight (lb) < 200 lb (90.7 kg)        This is a list of the screening recommended for you and due dates:  Health Maintenance  Topic Date Due   Tetanus Vaccine  10/22/2017   COVID-19 Vaccine (5 - Moderna series) 06/23/2020   Zoster (Shingles) Vaccine (1 of 2) 08/26/2021*   Flu Shot  08/18/2021   Mammogram  03/25/2022   DEXA scan (bone density measurement)  10/24/2022   Colon Cancer Screening  02/18/2023   Pneumonia Vaccine  Completed   Hepatitis C Screening: USPSTF Recommendation to screen - Ages 18-79 yo.  Completed   HPV Vaccine  Aged Out  *Topic was postponed. The date shown is not the original due date.

## 2021-08-22 IMAGING — CT CT ABD-PELV W/ CM
2 of 4 series · 15 of 46 positions shown, 17 images · IV contrast (omnipaque)
Comparison: CT 01/18/2019 and MRI 01/19/2019

CLINICAL DATA: Follow-up hepatic abscess and percutaneous drains.

EXAM:
CT ABDOMEN AND PELVIS WITH CONTRAST
TECHNIQUE: Multidetector CT imaging of the abdomen and pelvis was performed
using the standard protocol following bolus administration of
intravenous contrast.
CONTRAST:  100mL OMNIPAQUE IOHEXOL 300 MG/ML  SOLN

[Series 2: abd pelvis 5.00 · axial · 0.66mm/px · z∈[-1609,-1169]mm · 12 of 98 slices shown, 14 images]
[im 5/98  soft-tissue]
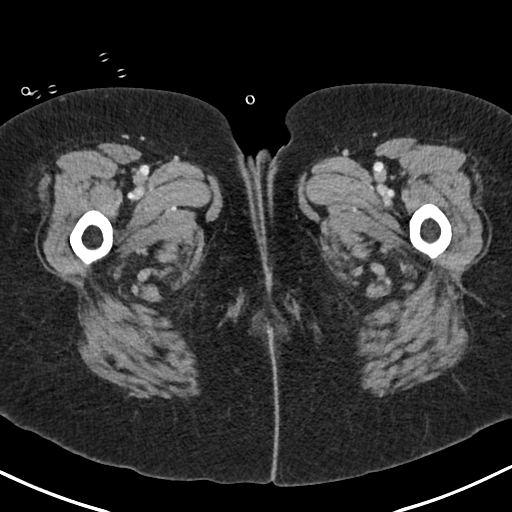
[im 5/98  bone]
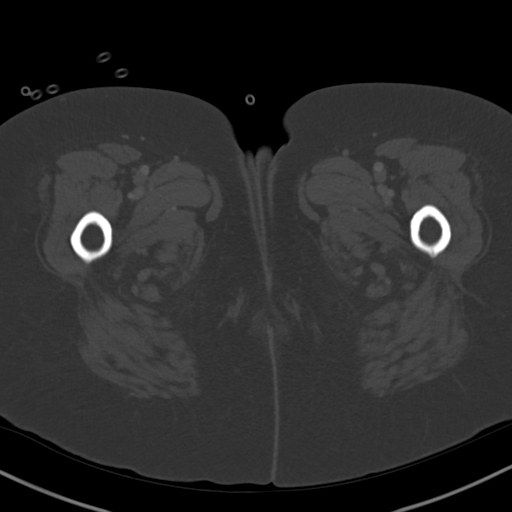
[im 13/98  soft-tissue]
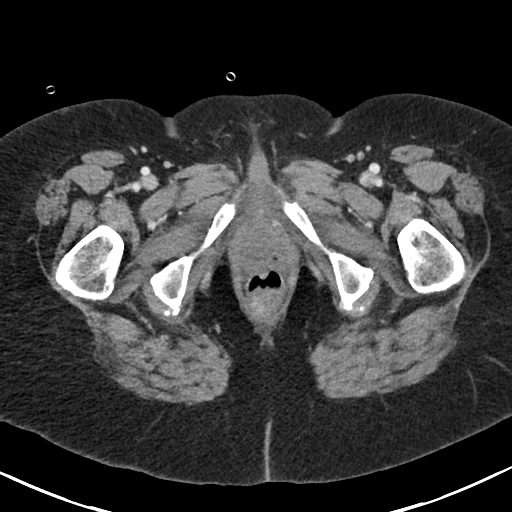
[im 21/98  soft-tissue]
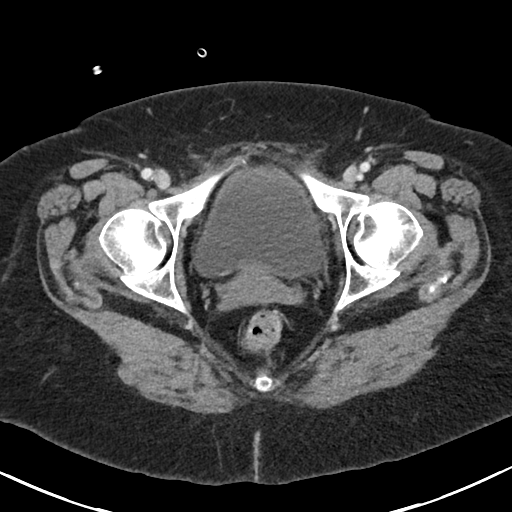
[im 29/98  soft-tissue]
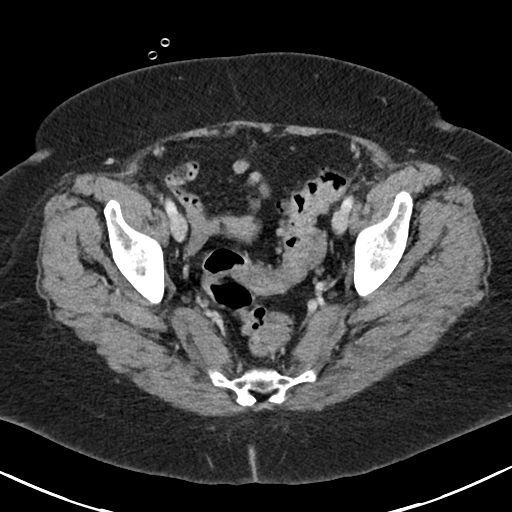
[im 37/98  soft-tissue]
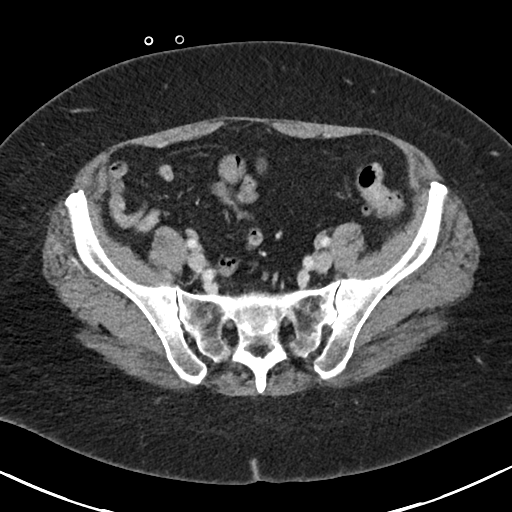
[im 45/98  soft-tissue]
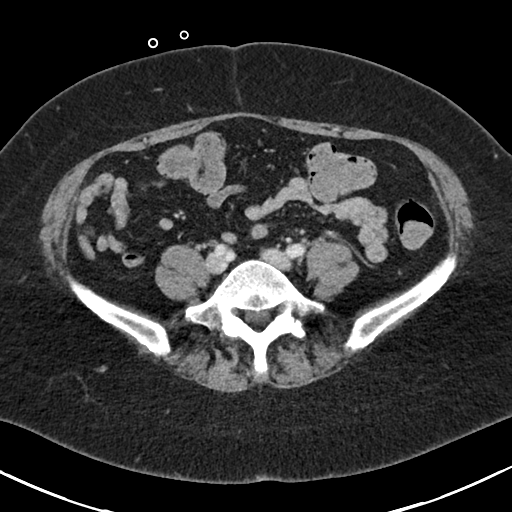
[im 53/98  soft-tissue]
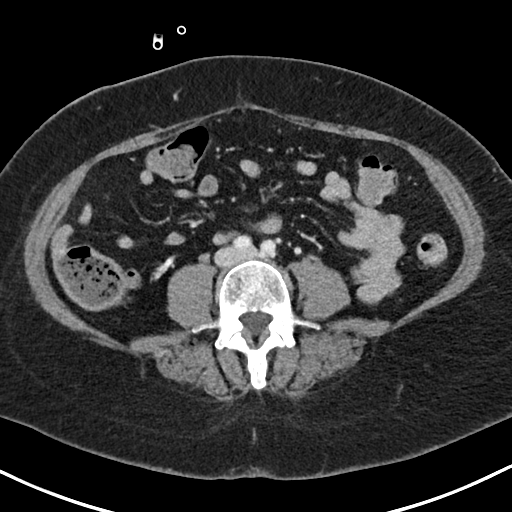
[im 61/98  soft-tissue]
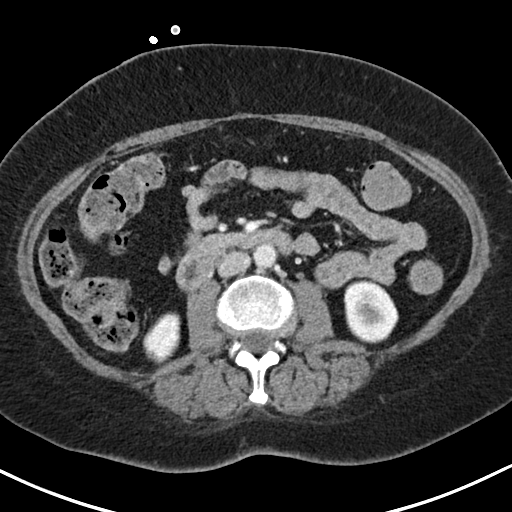
[im 69/98  soft-tissue]
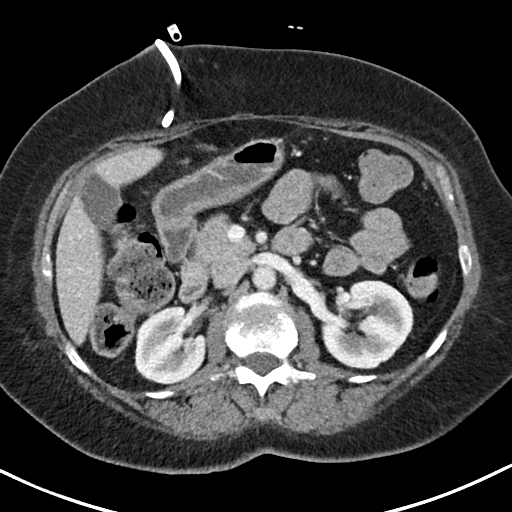
[im 69/98  bone]
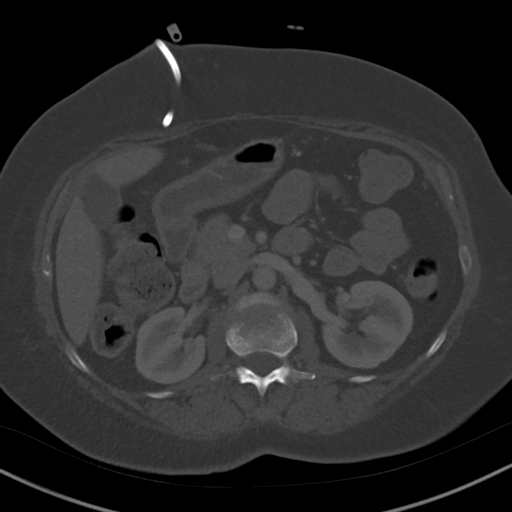
[im 77/98  soft-tissue]
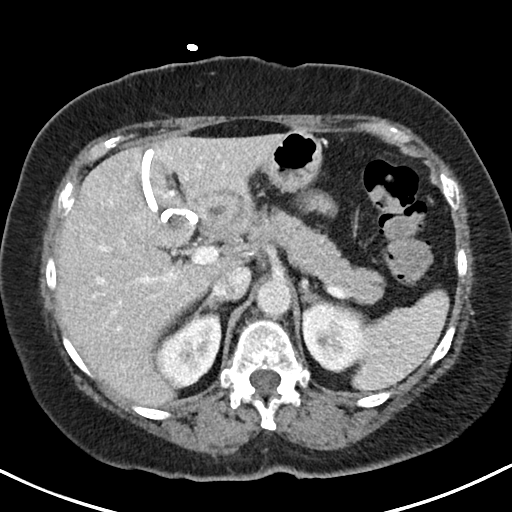
[im 85/98  soft-tissue]
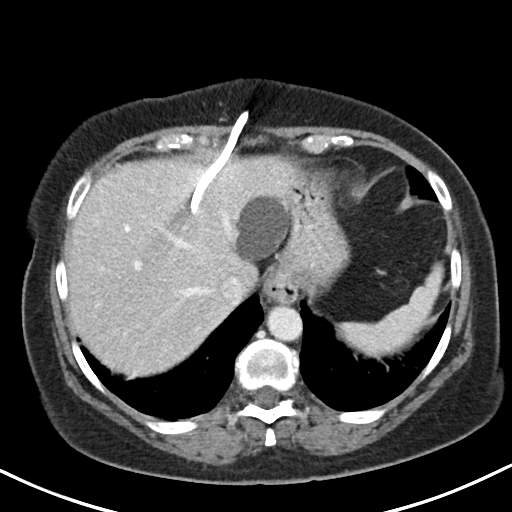
[im 93/98  soft-tissue]
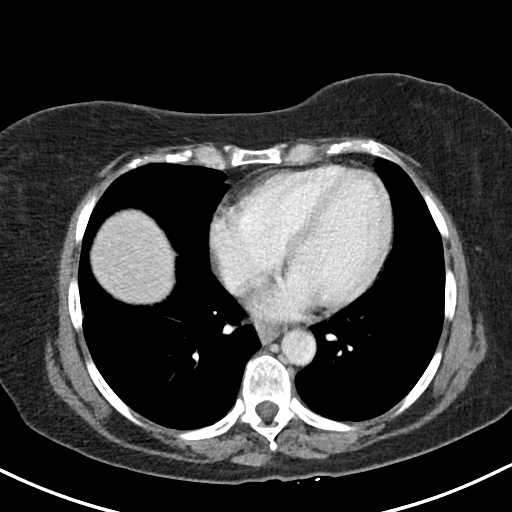

[Series 4: coronals abd pelvis 2.00 cor · coronal · 0.66mm/px · 3 of 147 slices shown]
[im 49/147  soft-tissue]
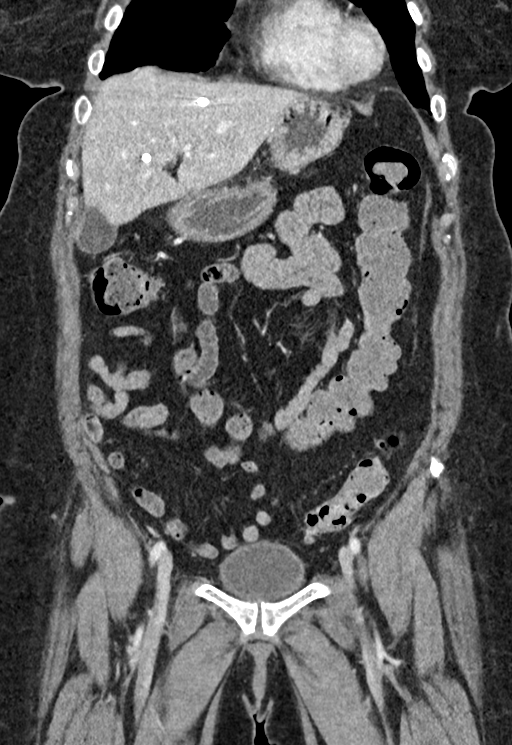
[im 65/147  soft-tissue]
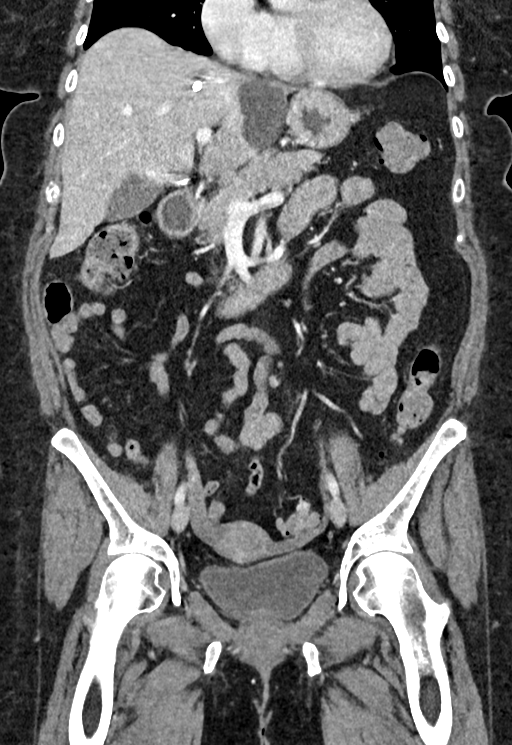
[im 82/147  soft-tissue]
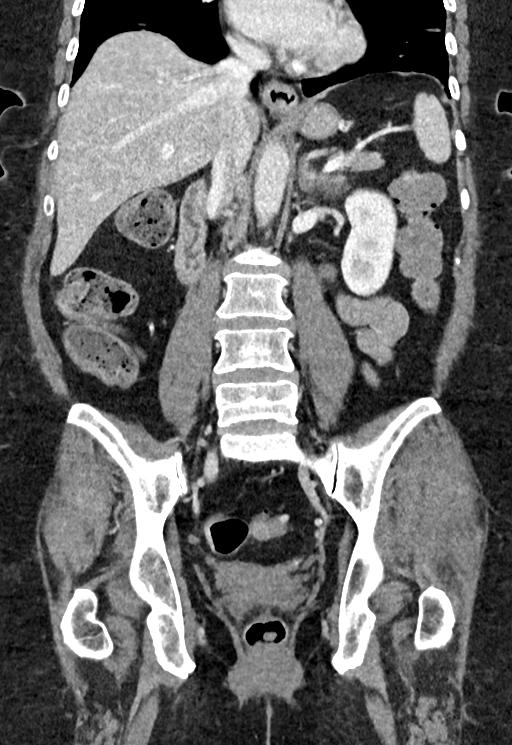

[15 of 46 positions shown; findings below may reference images not displayed]

FINDINGS: Lower chest: Lung bases are clear except for mild atelectasis. No
pleural effusions.

Hepatobiliary: There are 2 percutaneous drains within the liver. The
hepatic abscess near the hepatic dome has markedly decreased in size
and nearly resolved with the percutaneous drain. Residual
low-density material in the hepatic dome abscess is not much larger
than the drain pigtail and measures 1.6 x 2.9 cm and previously
measured 8.7 x 5.8 cm. The abscess in segment 4 B has basically
resolved with the percutaneous drain. The abscess in the inferior
left hepatic lobe (segment 3) has decreased in size measuring 3.1 x
2.1 x 2.9 cm and previously measured at least 3.3 x 3.7 x 3.8 cm.
Previously, there were small satellite abscess collections around
the segment 3 abscess that have essentially resolved. In addition,
the fluid component within the segment 3 abscess has markedly
decreased. There is a stable hepatic cyst in segment 2 measuring up
to 5.0 cm. No new hepatic abscesses. Normal appearance of the
gallbladder. Portal venous system is patent. No biliary dilatation.

Pancreas: Unremarkable. No pancreatic ductal dilatation or
surrounding inflammatory changes.

Spleen: Normal in size without focal abnormality.

Adrenals/Urinary Tract: Normal adrenal glands. Evidence for a tiny
cyst at the left kidney upper pole. No suspicious renal lesions.
Negative for hydronephrosis. Normal appearance of the urinary
bladder.

Stomach/Bowel: Diverticula in the sigmoid colon without inflammatory
changes. No evidence for bowel obstruction. Normal appearance of the
stomach with small hiatal hernia.

Vascular/Lymphatic: Mild atherosclerotic disease in the abdominal
aorta without aneurysm. Celiac trunk, SMA and IMA are patent. Venous
structures are unremarkable. No abdominopelvic lymphadenopathy.

Reproductive: Uterus and bilateral adnexa are unremarkable.

Other: Negative for ascites.  Negative for free air.

Musculoskeletal: No acute bone abnormality.
IMPRESSION: 1. Hepatic abscesses associated with the percutaneous drains have
essentially resolved. Minimal residual low-density material
associated with the drain at the hepatic dome. Residual abscess in
segment 3 has decreased in size and the small satellite collections
have resolved. Markedly decreased fluid component in the residual
segment 3 abscess. No new hepatic abscesses.
2. Stable hepatic cyst.
3. Small hiatal hernia.

## 2021-08-30 ENCOUNTER — Encounter: Payer: Self-pay | Admitting: Family Medicine

## 2021-11-02 ENCOUNTER — Ambulatory Visit (INDEPENDENT_AMBULATORY_CARE_PROVIDER_SITE_OTHER): Payer: Medicare Other | Admitting: Family Medicine

## 2021-11-02 ENCOUNTER — Encounter: Payer: Self-pay | Admitting: Family Medicine

## 2021-11-02 VITALS — BP 132/66 | HR 74 | Temp 97.7°F | Resp 16 | Ht 66.0 in | Wt 223.3 lb

## 2021-11-02 DIAGNOSIS — R21 Rash and other nonspecific skin eruption: Secondary | ICD-10-CM | POA: Diagnosis not present

## 2021-11-02 DIAGNOSIS — M25552 Pain in left hip: Secondary | ICD-10-CM

## 2021-11-02 MED ORDER — MELOXICAM 15 MG PO TABS: 7.5000 mg | ORAL_TABLET | Freq: Every day | ORAL | 1 refills | Status: DC

## 2021-11-02 MED ORDER — TRIAMCINOLONE ACETONIDE 0.025 % EX OINT: 1.0000 | TOPICAL_OINTMENT | Freq: Two times a day (BID) | CUTANEOUS | 0 refills | Status: AC | PRN

## 2021-11-02 NOTE — Patient Instructions (Addendum)
You can try ketoconazole cream or powder if the steroid doesn't help much Clotrimazole, ketoconazole, miconazole creams - usually available over the counter 1-2% creams and in womens health, jock itch, athletes foot

## 2021-11-02 NOTE — Progress Notes (Signed)
Patient ID: Allison Phillips, female    DOB: May 02, 1950, 70 y.o.   MRN: [id number]  PCP: Delsa Grana, PA-C  Chief Complaint  Patient presents with  . Skin Discoloration    In between thighs noticed last night    Subjective:   Allison Phillips is a 71 y.o. female, presents to clinic with CC of the following:  HPI  Patient presents for evaluation of skin changes/rash she just noticed in the mirror last week dark areas bilaterally in her inner thighs, she did not notice a rash or have any bumps or itching in the area.  She has no discomfort there right now.  She does have some slightly dry raised red areas to her anterior and lateral thighs which is also asymptomatic.  She has not tried anything for her hyperpigmented spots or the other rash  She has chronic left hip pain she has consulted with Ortho and it starting to hurt again she asks if she needs another referral she feels like she needs a another steroid shot, she has some Mobic at home has not been taking it and has been doing topical medicines Left hip ortho Rachelle Hora   Q's about vaccines: Immunization History  Administered Date(s) Administered  . Fluad Quad(high Dose 65+) 10/19/2018, 10/08/2019, 10/08/2020  . Influenza, High Dose Seasonal PF 11/14/2017  . Moderna Sars-Covid-2 Vaccination 03/02/2019, 04/02/2019, 11/13/2019, 04/28/2020  . Pneumococcal Conjugate-13 12/06/2016  . Pneumococcal Polysaccharide-23 11/02/2010, 08/28/2018, 01/19/2019  . Td 10/23/2007  . Zoster, Live 11/16/2011  She reports a severe reaction when she got flu and Shingrix at the same time She is planning to get RSV vaccine She asks about needing a new pneumococcal 20 shot And she is due for tetanus booster   Patient Active Problem List   Diagnosis Date Noted  . OSA (obstructive sleep apnea) 09/23/2020  . Aortic atherosclerosis ([place]) 11/14/2019  . Obesity (BMI 35.0-39.9 without comorbidity) 01/06/2018  . Osteopenia 04/20/2017  . Morbid  obesity ([place]) 12/30/2016  . Essential hypertension, benign 03/08/2016  . Tobacco abuse       Current Outpatient Medications:  .  Cyanocobalamin (B-12) 500 MCG TABS, Take 2,500 mcg by mouth daily. , Disp: , Rfl:  .  diclofenac Sodium (VOLTAREN) 1 % GEL, Apply 2 g topically 4 (four) times daily as needed (for chronic pain management)., Disp: 100 g, Rfl: 5 .  Gluc-Chonn-MSM-Boswellia-Vit D (GLUCOSAMINE CHONDROITIN + D3 PO), Take by mouth., Disp: , Rfl:  .  Multiple Vitamin (MULTIVITAMIN) tablet, Take 1 tablet by mouth daily. Centrum Silver, Disp: , Rfl:  .  Omega-3 Fatty Acids (FISH OIL PO), Take 1,200 mg daily by mouth. , Disp: , Rfl:  .  triamcinolone (KENALOG) 0.025 % ointment, Apply 1 Application topically 2 (two) times daily as needed., Disp: 30 g, Rfl: 0   Allergies  Allergen Reactions  . Adhesive [Tape]     Rash/dermatitis     Social History   Tobacco Use  . Smoking status: Every Day    Packs/day: 0.50    Years: 15.00    Total pack years: 7.50    Types: Cigarettes    Last attempt to quit: 10/24/2016    Years since quitting: 5.0  . Smokeless tobacco: Never  . Tobacco comments:    8-10 cigarettes daily- 06/09/2021  Vaping Use  . Vaping Use: Never used  Substance Use Topics  . Alcohol use: Yes    Alcohol/week: 4.0 standard drinks of alcohol    Types: 4  Standard drinks or equivalent per week    Comment: on occasion  . Drug use: No      Chart Review Today: ***  Review of Systems     Objective:   Vitals:   11/02/21 0955  BP: 132/66  Pulse: 74  Resp: 16  Temp: 97.7 F (36.5 C)  TempSrc: Oral  SpO2: 98%  Weight: 223 lb 4.8 oz (101.3 kg)  Height: '5\' 6"'$  (1.676 m)    Body mass index is 36.04 kg/m.  Physical Exam   Results for orders placed or performed in visit on 03/24/21  Hemoglobin A1c  Result Value Ref Range   Hgb A1c MFr Bld 5.7 (H) <5.7 % of total Hgb   Mean Plasma Glucose 117 mg/dL   eAG (mmol/L) 6.5 mmol/L       Assessment & Plan:    ***     Delsa Grana, PA-C 11/02/21 10:32 AM

## 2021-11-04 ENCOUNTER — Encounter: Payer: Self-pay | Admitting: Family Medicine

## 2021-11-16 ENCOUNTER — Other Ambulatory Visit: Payer: Self-pay | Admitting: Orthopedic Surgery

## 2021-11-16 DIAGNOSIS — M1612 Unilateral primary osteoarthritis, left hip: Secondary | ICD-10-CM

## 2021-11-20 ENCOUNTER — Ambulatory Visit
Admission: RE | Admit: 2021-11-20 | Discharge: 2021-11-20 | Disposition: A | Discharge status: Home / Self Care | Payer: Medicare Other | Source: Ambulatory Visit | Attending: Orthopedic Surgery | Admitting: Orthopedic Surgery

## 2021-11-20 DIAGNOSIS — M1612 Unilateral primary osteoarthritis, left hip: Secondary | ICD-10-CM

## 2021-12-09 ENCOUNTER — Encounter: Payer: Self-pay | Admitting: Family Medicine

## 2021-12-09 ENCOUNTER — Ambulatory Visit (INDEPENDENT_AMBULATORY_CARE_PROVIDER_SITE_OTHER): Payer: Medicare Other | Admitting: Family Medicine

## 2021-12-09 VITALS — BP 132/82 | HR 86 | Temp 99.0°F | Resp 16 | Ht 66.0 in | Wt 221.7 lb

## 2021-12-09 DIAGNOSIS — R058 Other specified cough: Secondary | ICD-10-CM

## 2021-12-09 DIAGNOSIS — R0981 Nasal congestion: Secondary | ICD-10-CM | POA: Diagnosis not present

## 2021-12-09 DIAGNOSIS — Z720 Tobacco use: Secondary | ICD-10-CM | POA: Diagnosis not present

## 2021-12-09 LAB — POCT INFLUENZA A/B
Influenza A, POC: NEGATIVE
Influenza B, POC: NEGATIVE

## 2021-12-09 MED ORDER — TRELEGY ELLIPTA 100-62.5-25 MCG/ACT IN AEPB: 1.0000 | INHALATION_SPRAY | Freq: Every day | RESPIRATORY_TRACT | 0 refills | Status: DC

## 2021-12-09 MED ORDER — BENZONATATE 100 MG PO CAPS: 100.0000 mg | ORAL_CAPSULE | Freq: Three times a day (TID) | ORAL | 0 refills | Status: DC | PRN

## 2021-12-09 MED ORDER — AZITHROMYCIN 250 MG PO TABS: ORAL_TABLET | ORAL | 0 refills | Status: AC

## 2021-12-09 MED ORDER — PREDNISONE 10 MG PO TABS: 10.0000 mg | ORAL_TABLET | Freq: Two times a day (BID) | ORAL | 0 refills | Status: DC

## 2021-12-09 NOTE — Progress Notes (Signed)
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 She states felt fine until Monday but yesterday she developed rhinorrhea in the morning, by the afternoon she had fatigue, now she has nasal congestion and productive cough that is green in color. She denies SOB but has been wheezing. No chills or fever. Denies body aches.   She has not been smoking since she has been sick. She was unable to use CPAP machine last night due to nasal congestion  She had COVID-19 and flu vaccines this season  Did not have RSV vaccine yet  She had rapid COVID test yesterday and also send off that is not back yet.   Patient Active Problem List   Diagnosis Date Noted   OSA (obstructive sleep apnea) 09/23/2020   Aortic atherosclerosis ([place]) 11/14/2019   Obesity (BMI 35.0-39.9 without comorbidity) 01/06/2018   Osteopenia 04/20/2017   Morbid obesity ([place]) 12/30/2016   Essential hypertension, benign 03/08/2016   Tobacco abuse     Past Surgical History:  Procedure Laterality Date   ANAL FISSURE REPAIR     APPENDECTOMY     CESAREAN SECTION  1978   COLONOSCOPY WITH PROPOFOL N/A 12/18/2014   Procedure: COLONOSCOPY WITH PROPOFOL;  Surgeon: Christene Lye, MD;  Location: [facility] ENDOSCOPY;  Service: Endoscopy;  Laterality: N/A;   COLONOSCOPY WITH PROPOFOL N/A 02/17/2018   Procedure: COLONOSCOPY WITH PROPOFOL;  Surgeon: Virgel Manifold, MD;  Location: [facility] ENDOSCOPY;  Service: Endoscopy;  Laterality: N/A;   LIPOMA EXCISION  07/2015   right shoulder   LIPOMA EXCISION  11/19/2015   OOPHORECTOMY     for "fibroids" not sure which ovary    Family History  Problem Relation Age of Onset   Hypertension Mother    Kidney disease Mother    Alzheimer's disease Mother    Thyroid cancer Son    Lung cancer Brother    Rectal cancer Brother     Breast cancer Neg Hx     Social History   Tobacco Use   Smoking status: Every Day    Packs/day: 0.50    Years: 15.00    Total pack years: 7.50    Types: Cigarettes    Last attempt to quit: 10/24/2016    Years since quitting: 5.1   Smokeless tobacco: Never   Tobacco comments:    8-10 cigarettes daily- 06/09/2021  Substance Use Topics   Alcohol use: Yes    Alcohol/week: 4.0 standard drinks of alcohol    Types: 4 Standard drinks or equivalent per week    Comment: on occasion     Current Outpatient Medications:    Cyanocobalamin (B-12) 500 MCG TABS, Take 2,500 mcg by mouth daily. , Disp: , Rfl:    diclofenac Sodium (VOLTAREN) 1 % GEL, Apply 2 g topically 4 (four) times daily as needed (for chronic pain management)., Disp: 100 g, Rfl: 5   Gluc-Chonn-MSM-Boswellia-Vit D (GLUCOSAMINE CHONDROITIN + D3 PO), Take by mouth., Disp: , Rfl:    meloxicam (MOBIC) 15 MG tablet, Take 0.5-1 tablets (7.5-15 mg total) by mouth daily., Disp: 30 tablet, Rfl: 1   Multiple Vitamin (MULTIVITAMIN) tablet, Take 1 tablet by mouth daily. Centrum Silver, Disp: , Rfl:    Omega-3 Fatty Acids (FISH OIL PO), Take 1,200 mg daily by mouth. , Disp: ,  Rfl:    triamcinolone (KENALOG) 0.025 % ointment, Apply 1 Application topically 2 (two) times daily as needed., Disp: 30 g, Rfl: 0  Allergies  Allergen Reactions   Adhesive [Tape]     Rash/dermatitis    I personally reviewed active problem list, medication list, allergies, family history, social history, health maintenance with the patient/caregiver today.   ROS  Ten systems reviewed and is negative except as mentioned in HPI   Objective  Vitals:   12/09/21 0915  BP: 132/82  Pulse: 86  Resp: 16  Temp: 99 F (37.2 C)  TempSrc: Oral  SpO2: 96%  Weight: 221 lb 11.2 oz (100.6 kg)  Height: '5\' 6"'$  (1.676 m)    Body mass index is 35.78 kg/m.  Physical Exam  Constitutional: Patient appears well-developed and well-nourished. Obese  No distress.   HEENT: head atraumatic, normocephalic, pupils equal and reactive to light, ears normal TM bilateral , boggy turbinates -worse on left side, neck supple, throat within normal limits Cardiovascular: Normal rate, regular rhythm and normal heart sounds.  No murmur heard. No BLE edema. Pulmonary/Chest: Effort normal and breath sounds normal. No respiratory distress. Abdominal: Soft.  There is no tenderness. Psychiatric: Patient has a normal mood and affect. behavior is normal. Judgment and thought content normal.    PHQ2/9:    12/09/2021    9:15 AM 11/02/2021    9:44 AM 07/29/2021    8:10 AM 05/26/2021    9:12 AM 03/24/2021    1:00 PM  Depression screen PHQ 2/9  Decreased Interest 0 0 1 0 0  Down, Depressed, Hopeless 0 0 1 0 0  PHQ - 2 Score 0 0 2 0 0  Altered sleeping 0 0 0  0  Tired, decreased energy 0 0 0  0  Change in appetite 0 0 0  0  Feeling bad or failure about yourself  0 0 0  0  Trouble concentrating 0 0 0  0  Moving slowly or fidgety/restless 0 0 0  0  Suicidal thoughts 0 0 0  0  PHQ-9 Score 0 0 2  0  Difficult doing work/chores Not difficult at all Not difficult at all   Not difficult at all    phq 9 is negative   Fall Risk:    12/09/2021    9:14 AM 11/02/2021    9:44 AM 07/29/2021    8:09 AM 05/26/2021    9:12 AM 03/24/2021    1:00 PM  [place] in the past year? 0 0 0 0 0  Number falls in past yr: 0 0 0  0  Injury with Fall? 0 0 0  0  Risk for fall due to : No Fall Risks No Fall Risks No Fall Risks No Fall Risks No Fall Risks  Follow up Falls prevention discussed;Education provided;Falls evaluation completed Falls prevention discussed;Education provided Falls prevention discussed Falls prevention discussed Falls prevention discussed      Functional Status Survey: Is the patient deaf or have difficulty hearing?: No Does the patient have difficulty seeing, even when wearing glasses/contacts?: No Does the patient have difficulty concentrating, remembering,  or making decisions?: No Does the patient have difficulty walking or climbing stairs?: No Does the patient have difficulty dressing or bathing?: No Does the patient have difficulty doing errands alone such as visiting a doctor's office or shopping?: No    Assessment & Plan  1. Nasal congestion  - POCT Influenza A/B  2. Productive cough  -  POCT Influenza A/B - azithromycin (ZITHROMAX) 250 MG tablet; Take 2 tablets on day 1, then 1 tablet daily on days 2 through 5  Dispense: 6 tablet; Refill: 0 - Fluticasone-Umeclidin-Vilant (TRELEGY ELLIPTA) 100-62.5-25 MCG/ACT AEPB; Inhale 1 puff into the lungs daily.  Dispense: 1 each; Refill: 0 - benzonatate (TESSALON) 100 MG capsule; Take 1 capsule (100 mg total) by mouth 3 (three) times daily as needed for cough.  Dispense: 40 capsule; Refill: 0 - predniSONE (DELTASONE) 10 MG tablet; Take 1 tablet (10 mg total) by mouth 2 (two) times daily with a meal.  Dispense: 10 tablet; Refill: 0  Explained we will send antibiotics and prednisone since she is a smoker and the clinic will be closed for the next 4 days. Advised her not to take it if COVID-19 test positive, and that we will treat her with Paxlovid instead if she gets results from [facility] before the end of the day Go to [facility] if SOB or symptoms gets worse over the next few days   3. Tobacco abuse  Advised her to stop smoking

## 2021-12-11 ENCOUNTER — Encounter: Payer: Self-pay | Admitting: Family Medicine

## 2022-01-27 ENCOUNTER — Other Ambulatory Visit: Payer: Self-pay | Admitting: Family Medicine

## 2022-01-27 DIAGNOSIS — Z1231 Encounter for screening mammogram for malignant neoplasm of breast: Secondary | ICD-10-CM

## 2022-03-11 ENCOUNTER — Ambulatory Visit
Admission: RE | Admit: 2022-03-11 | Discharge: 2022-03-11 | Disposition: A | Discharge status: Home / Self Care | Payer: Medicare Other | Source: Ambulatory Visit | Attending: Family Medicine | Admitting: Family Medicine

## 2022-03-11 DIAGNOSIS — J432 Centrilobular emphysema: Secondary | ICD-10-CM | POA: Insufficient documentation

## 2022-03-11 DIAGNOSIS — I700 Atherosclerosis of aorta: Secondary | ICD-10-CM | POA: Diagnosis present

## 2022-03-11 DIAGNOSIS — Z122 Encounter for screening for malignant neoplasm of respiratory organs: Secondary | ICD-10-CM | POA: Diagnosis not present

## 2022-03-11 DIAGNOSIS — F17210 Nicotine dependence, cigarettes, uncomplicated: Secondary | ICD-10-CM | POA: Insufficient documentation

## 2022-03-11 DIAGNOSIS — Z87891 Personal history of nicotine dependence: Secondary | ICD-10-CM

## 2022-03-15 ENCOUNTER — Other Ambulatory Visit: Payer: Self-pay

## 2022-03-15 DIAGNOSIS — Z87891 Personal history of nicotine dependence: Secondary | ICD-10-CM

## 2022-03-15 DIAGNOSIS — F17210 Nicotine dependence, cigarettes, uncomplicated: Secondary | ICD-10-CM

## 2022-03-31 ENCOUNTER — Encounter: Payer: Self-pay | Admitting: Family Medicine

## 2022-03-31 ENCOUNTER — Ambulatory Visit: Payer: Self-pay

## 2022-03-31 ENCOUNTER — Ambulatory Visit (INDEPENDENT_AMBULATORY_CARE_PROVIDER_SITE_OTHER): Payer: Medicare Other | Admitting: Family Medicine

## 2022-03-31 DIAGNOSIS — I10 Essential (primary) hypertension: Secondary | ICD-10-CM | POA: Diagnosis not present

## 2022-03-31 DIAGNOSIS — I700 Atherosclerosis of aorta: Secondary | ICD-10-CM | POA: Diagnosis not present

## 2022-03-31 DIAGNOSIS — J432 Centrilobular emphysema: Secondary | ICD-10-CM

## 2022-03-31 DIAGNOSIS — R7303 Prediabetes: Secondary | ICD-10-CM

## 2022-03-31 DIAGNOSIS — Z720 Tobacco use: Secondary | ICD-10-CM

## 2022-03-31 MED ORDER — VALSARTAN 80 MG PO TABS: 80.0000 mg | ORAL_TABLET | Freq: Every day | ORAL | 0 refills | Status: DC

## 2022-03-31 MED ORDER — ROSUVASTATIN CALCIUM 10 MG PO TABS: 10.0000 mg | ORAL_TABLET | Freq: Every day | ORAL | 0 refills | Status: DC

## 2022-03-31 MED ORDER — ASPIRIN 81 MG PO TBEC: 81.0000 mg | DELAYED_RELEASE_TABLET | Freq: Every day | ORAL | 1 refills | Status: AC

## 2022-03-31 NOTE — Telephone Encounter (Signed)
    Chief Complaint: Elevated BP with headache Symptoms: Headache Frequency: This week Pertinent Negatives: Patient denies  Disposition: [] ED /[] Urgent Care (no appt availability in office) / [x] Appointment(In office/virtual)/ []  [facility]/ [] Home Care/ [] Refused Recommended Disposition /[] [facility]/ []  Follow-up with PCP Additional Notes: Go to ED for worsening of symptoms.  Reason for Disposition  Systolic BP  >= 536 OR Diastolic >= 468  Answer Assessment - Initial Assessment Questions 1. BLOOD PRESSURE: "What is the blood pressure?" "Did you take at least two measurements 5 minutes apart?"     144/99 2. ONSET: "When did you take your blood pressure?"     This week 3. HOW: "How did you take your blood pressure?" (e.g., automatic home BP monitor, visiting nurse)     Home cuff 4. HISTORY: "Do you have a history of high blood pressure?"     No 5. MEDICINES: "Are you taking any medicines for blood pressure?" "Have you missed any doses recently?"     No 6. OTHER SYMPTOMS: "Do you have any symptoms?" (e.g., blurred vision, chest pain, difficulty breathing, headache, weakness)     Headache 7. PREGNANCY: "Is there any chance you are pregnant?" "When was your last menstrual period?"     No  Protocols used: Blood Pressure - High-A-AH

## 2022-03-31 NOTE — Progress Notes (Signed)
Name: ABBAGAYLE Phillips   MRN: [id number]    DOB: 07/26/1950   Date:03/31/2022       Progress Note  Subjective  Chief Complaint  Blood Pressure  HPI  HTN: she was told in the past that she had slightly elevated BP but never took medication for HTN. 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 She started to check her bp and it was 144/99 - highest reading, and has remained over 140/90's. Denies associated chest pain, palpitation or SOB Denies neuro deficit.   Headaches:she states that over the past year she has episodes of scotomas for seconds  followed by a headache that is described as throbbing frontal sensation, associated with photophobia but no phonophobia , it can last 30-40 minutes and resolves by itself. Episodes started about one year ago She went to ophthalmologist and was given reassurance. Explained it has some migraine features .   Atherosclerosis of aorta: discussed results of CT chest , explained importance of statin therapy and she is willing to take it   Emphysema: reviewed CT chest, she is wiling to quit smoking, and is thinking about getting nicotine patches, advised to start at 14 dose. She has quit in the past and explained she will likely be able to quit again She could not afford Trelegy and since she does not have any SOB and cough is mild she wants to hold off on medication   Morbid Obesity: BMI over 35 with co-morbidities , she asked about weight loss medication, she will contact her insurance. She states she will start walking   Patient Active Problem List   Diagnosis Date Noted   OSA (obstructive sleep apnea) 09/23/2020   Aortic atherosclerosis ([place]) 11/14/2019   Obesity (BMI 35.0-39.9 without comorbidity) 01/06/2018   Osteopenia 04/20/2017   Morbid obesity ([place]) 12/30/2016   Essential hypertension, benign 03/08/2016   Tobacco abuse     Past  Surgical History:  Procedure Laterality Date   ANAL FISSURE REPAIR     APPENDECTOMY     CESAREAN SECTION  1978   COLONOSCOPY WITH PROPOFOL N/A 12/18/2014   Procedure: COLONOSCOPY WITH PROPOFOL;  Surgeon: Christene Lye, MD;  Location: [facility] ENDOSCOPY;  Service: Endoscopy;  Laterality: N/A;   COLONOSCOPY WITH PROPOFOL N/A 02/17/2018   Procedure: COLONOSCOPY WITH PROPOFOL;  Surgeon: Virgel Manifold, MD;  Location: [facility] ENDOSCOPY;  Service: Endoscopy;  Laterality: N/A;   LIPOMA EXCISION  07/2015   right shoulder   LIPOMA EXCISION  11/19/2015   OOPHORECTOMY     for "fibroids" not sure which ovary    Family History  Problem Relation Age of Onset   Hypertension Mother    Kidney disease Mother    Alzheimer's disease Mother    Thyroid cancer Son    Lung cancer Brother    Rectal cancer Brother    Breast cancer Neg Hx     Social History   Tobacco Use   Smoking status: Every Day    Packs/day: 0.50    Years: 15.00    Total pack years: 7.50    Types: Cigarettes    Last attempt to quit: 10/24/2016    Years since quitting: 5.4   Smokeless tobacco: Never   Tobacco comments:    8-10 cigarettes daily- 06/09/2021  Substance Use Topics   Alcohol use: Yes    Alcohol/week: 4.0 standard drinks of alcohol  Types: 4 Standard drinks or equivalent per week    Comment: on occasion     Current Outpatient Medications:    Cyanocobalamin (B-12) 500 MCG TABS, Take 2,500 mcg by mouth daily. , Disp: , Rfl:    diclofenac Sodium (VOLTAREN) 1 % GEL, Apply 2 g topically 4 (four) times daily as needed (for chronic pain management)., Disp: 100 g, Rfl: 5   Gluc-Chonn-MSM-Boswellia-Vit D (GLUCOSAMINE CHONDROITIN + D3 PO), Take by mouth., Disp: , Rfl:    meloxicam (MOBIC) 15 MG tablet, Take 0.5-1 tablets (7.5-15 mg total) by mouth daily., Disp: 30 tablet, Rfl: 1   Multiple Vitamin (MULTIVITAMIN) tablet, Take 1 tablet by mouth daily. Centrum Silver, Disp: , Rfl:    Omega-3 Fatty Acids (FISH OIL  PO), Take 1,200 mg daily by mouth. , Disp: , Rfl:    triamcinolone (KENALOG) 0.025 % ointment, Apply 1 Application topically 2 (two) times daily as needed., Disp: 30 g, Rfl: 0  Allergies  Allergen Reactions   Adhesive [Tape]     Rash/dermatitis    I personally reviewed active problem list, medication list, allergies, family history, social history, health maintenance with the patient/caregiver today.   ROS  Ten systems reviewed and is negative except as mentioned in HPI   Objective  Vitals:   03/31/22 1050  BP: (!) 150/80  Pulse: 67  Resp: 16  Temp: 98.1 F (36.7 C)  TempSrc: Oral  SpO2: 98%  Weight: 226 lb 12.8 oz (102.9 kg)  Height: '5\' 6"'$  (1.676 m)    Body mass index is 36.61 kg/m.  Physical Exam  Constitutional: Patient appears well-developed and well-nourished. Obese  No distress.  HEENT: head atraumatic, normocephalic, pupils equal and reactive to light, neck supple Cardiovascular: Normal rate, regular rhythm and normal heart sounds.  No murmur heard. No BLE edema. Pulmonary/Chest: Effort normal and breath sounds normal. No respiratory distress. Abdominal: Soft.  There is no tenderness. Psychiatric: Patient has a normal mood and affect. behavior is normal. Judgment and thought content normal.    PHQ2/9:    03/31/2022   10:52 AM 12/09/2021    9:15 AM 11/02/2021    9:44 AM 07/29/2021    8:10 AM 05/26/2021    9:12 AM  Depression screen PHQ 2/9  Decreased Interest 0 0 0 1 0  Down, Depressed, Hopeless 1 0 0 1 0  PHQ - 2 Score 1 0 0 2 0  Altered sleeping 0 0 0 0   Tired, decreased energy 0 0 0 0   Change in appetite 1 0 0 0   Feeling bad or failure about yourself  0 0 0 0   Trouble concentrating 0 0 0 0   Moving slowly or fidgety/restless 0 0 0 0   Suicidal thoughts 0 0 0 0   PHQ-9 Score 2 0 0 2   Difficult doing work/chores Not difficult at all Not difficult at all Not difficult at all      phq 9 is negative   Fall Risk:    03/31/2022   10:52 AM  12/09/2021    9:14 AM 11/02/2021    9:44 AM 07/29/2021    8:09 AM 05/26/2021    9:12 AM  Fall Risk   Falls in the past year? 0 0 0 0 0  Number falls in past yr:  0 0 0   Injury with Fall?  0 0 0   Risk for fall due to : No Fall Risks No Fall Risks No Fall Risks No  Fall Risks No Fall Risks  Follow up Falls prevention discussed Falls prevention discussed;Education provided;Falls evaluation completed Falls prevention discussed;Education provided Falls prevention discussed Falls prevention discussed      Functional Status Survey: Is the patient deaf or have difficulty hearing?: No Does the patient have difficulty seeing, even when wearing glasses/contacts?: No Does the patient have difficulty concentrating, remembering, or making decisions?: No Does the patient have difficulty walking or climbing stairs?: No Does the patient have difficulty dressing or bathing?: No Does the patient have difficulty doing errands alone such as visiting a doctor's office or shopping?: No    Assessment & Plan  1. Morbid obesity ([place])  She is going to start walking more and eat healthier , she asked me about weight loss medication , advised her to contact insurance [employer] to see if they cover it  2. Aortic atherosclerosis ([facility])  - Lipid panel - rosuvastatin (CRESTOR) 10 MG tablet; Take 1 tablet (10 mg total) by mouth daily.  Dispense: 90 tablet; Refill: 0 - aspirin EC 81 MG tablet; Take 1 tablet (81 mg total) by mouth daily. Swallow whole.  Dispense: 100 tablet; Refill: 1  3. Essential hypertension, benign  - CBC with Differential/Platelet - COMPLETE METABOLIC PANEL WITH GFR - TSH - valsartan (DIOVAN) 80 MG tablet; Take 1 tablet (80 mg total) by mouth daily.  Dispense: 90 tablet; Refill: 0   We tried to do an EKG in our office but due to artifacts unable to read it, we will try again when she returns in two weeks.   Discussed importance of calling 911 if chest pain, jaw pain, arm pain, decrease in  exercise tolerance of SOB  Also discussed symptoms of stroke and importance of calling 911  4. Prediabetes  - Hemoglobin A1c  5. Tobacco abuse   6. Centrilobular emphysema ([place])  She will try to quit smoking, she did not try Trelegy and prefers not taking any inhalers at this time

## 2022-04-01 LAB — COMPLETE METABOLIC PANEL WITH GFR
AG Ratio: 1.6 (calc) (ref 1.0–2.5)
ALT: 9 U/L (ref 6–29)
AST: 13 U/L (ref 10–35)
Albumin: 4.1 g/dL (ref 3.6–5.1)
Alkaline phosphatase (APISO): 55 U/L (ref 37–153)
BUN: 17 mg/dL (ref 7–25)
CO2: 26 mmol/L (ref 20–32)
Calcium: 9.6 mg/dL (ref 8.6–10.4)
Chloride: 104 mmol/L (ref 98–110)
Creat: 1 mg/dL (ref 0.60–1.00)
Globulin: 2.6 g/dL (calc) (ref 1.9–3.7)
Glucose, Bld: 87 mg/dL (ref 65–99)
Potassium: 5.1 mmol/L (ref 3.5–5.3)
Sodium: 141 mmol/L (ref 135–146)
Total Bilirubin: 0.3 mg/dL (ref 0.2–1.2)
Total Protein: 6.7 g/dL (ref 6.1–8.1)
eGFR: 60 mL/min/{1.73_m2} (ref >=60)

## 2022-04-01 LAB — TSH: TSH: 0.99 mIU/L (ref 0.40–4.50)

## 2022-04-01 LAB — CBC WITH DIFFERENTIAL/PLATELET
Absolute Monocytes: 517 cells/uL (ref 200–950)
Basophils Absolute: 98 cells/uL (ref 0–200)
Basophils Relative: 1.2 %
Eosinophils Absolute: 148 cells/uL (ref 15–500)
Eosinophils Relative: 1.8 %
HCT: 39.8 % (ref 35.0–45.0)
Hemoglobin: 13.4 g/dL (ref 11.7–15.5)
Lymphs Abs: 2452 cells/uL (ref 850–3900)
MCH: 29.1 pg (ref 27.0–33.0)
MCHC: 33.7 g/dL (ref 32.0–36.0)
MCV: 86.3 fL (ref 80.0–100.0)
MPV: 11.1 fL (ref 7.5–12.5)
Monocytes Relative: 6.3 %
Neutro Abs: 4986 cells/uL (ref 1500–7800)
Neutrophils Relative %: 60.8 %
Platelets: 317 10*3/uL (ref 140–400)
RBC: 4.61 10*6/uL (ref 3.80–5.10)
RDW: 14 % (ref 11.0–15.0)
Total Lymphocyte: 29.9 %
WBC: 8.2 10*3/uL (ref 3.8–10.8)

## 2022-04-01 LAB — LIPID PANEL
Cholesterol: 163 mg/dL (ref <200)
HDL: 77 mg/dL (ref >=50)
LDL Cholesterol (Calc): 60 mg/dL (calc)
Non-HDL Cholesterol (Calc): 86 mg/dL (calc) (ref <130)
Total CHOL/HDL Ratio: 2.1 (calc) (ref <5.0)
Triglycerides: 183 mg/dL — ABNORMAL HIGH (ref <150)

## 2022-04-01 LAB — HEMOGLOBIN A1C
Hgb A1c MFr Bld: 5.8 % of total Hgb — ABNORMAL HIGH (ref <5.7)
Mean Plasma Glucose: 120 mg/dL
eAG (mmol/L): 6.6 mmol/L

## 2022-04-02 ENCOUNTER — Ambulatory Visit
Admission: RE | Admit: 2022-04-02 | Discharge: 2022-04-02 | Disposition: A | Discharge status: Home / Self Care | Payer: Medicare Other | Source: Ambulatory Visit | Attending: Family Medicine | Admitting: Family Medicine

## 2022-04-02 DIAGNOSIS — Z1231 Encounter for screening mammogram for malignant neoplasm of breast: Secondary | ICD-10-CM | POA: Insufficient documentation

## 2022-04-13 NOTE — Progress Notes (Unsigned)
Name: Allison Phillips   MRN: [id number]    DOB: [birth year]   Date:04/14/2022       Progress Note  Subjective  Chief Complaint  Follow Up  HPI  HTN: she was told in the past that she had slightly elevated BP but never took medication for HTN. 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 She started to check her bp and it was 144/99 - highest reading, and has remained over 140/90's. When she saw me bp in our office was 150/80, we tried to get an EKG but was getting artifacts. We gave her statin therapy, aspirin and valsartan. She is taking Crestor 10 mg and aspirin 81 mg, headaches has resolved and bp at home without medication has improved usually 130's/70's, once it went up to 152/80 and once at 144/73. BP from her monitor matched our reading We will refer her to cardiologist due to change in EKG   Headaches:she states that over the past year she has episodes of scotomas for seconds  followed by a headache that is described as throbbing frontal sensation, associated with photophobia but no phonophobia , it can last 30-40 minutes and resolves by itself. Episodes started about one year ago She went to ophthalmologist and was given reassurance. Some migraine features and no symptoms since last visit 2 weeks ago   Atherosclerosis of aorta: discussed results of CT chest , she is now taking crestor 10 mg and aspirin daily   Emphysema: reviewed CT chest, she is wiling to quit smoking, she is down to smoking 5 cigarettes daily and will switch to nicotine gum eventually   Morbid Obesity: BMI over 35 with co-morbidities  She is eating healthier snacks and she walked a couple of times since last visit   Paresthesia right lower extremity : intermittent , sensitivity when bathing or drying her right lower lateral leg, also has intermittent sharp pain Symptoms present over the past few weeks   Patient Active Problem List    Diagnosis Date Noted   OSA (obstructive sleep apnea) 09/23/2020   Aortic atherosclerosis ([place]) 11/14/2019   Obesity (BMI 35.0-39.9 without comorbidity) 01/06/2018   Osteopenia 04/20/2017   Morbid obesity ([place]) 12/30/2016   Essential hypertension, benign 03/08/2016   Tobacco abuse     Past Surgical History:  Procedure Laterality Date   ANAL FISSURE REPAIR     APPENDECTOMY     CESAREAN SECTION  1978   COLONOSCOPY WITH PROPOFOL N/A 12/18/2014   Procedure: COLONOSCOPY WITH PROPOFOL;  Surgeon: Christene Lye, MD;  Location: [facility] ENDOSCOPY;  Service: Endoscopy;  Laterality: N/A;   COLONOSCOPY WITH PROPOFOL N/A 02/17/2018   Procedure: COLONOSCOPY WITH PROPOFOL;  Surgeon: Virgel Manifold, MD;  Location: [facility] ENDOSCOPY;  Service: Endoscopy;  Laterality: N/A;   LIPOMA EXCISION  07/2015   right shoulder   LIPOMA EXCISION  11/19/2015   OOPHORECTOMY     for "fibroids" not sure which ovary    Family History  Problem Relation Age of Onset   Hypertension Mother    Kidney disease Mother    Alzheimer's disease Mother    Thyroid cancer Son    Lung cancer Brother    Rectal cancer Brother    Breast cancer Neg Hx     Social History   Tobacco Use   Smoking status: Every Day    Packs/day: 0.50    Years: 15.00  Additional pack years: 0.00    Total pack years: 7.50    Types: Cigarettes    Last attempt to quit: 10/24/2016    Years since quitting: 5.4   Smokeless tobacco: Never   Tobacco comments:    8-10 cigarettes daily- 06/09/2021  Substance Use Topics   Alcohol use: Yes    Alcohol/week: 4.0 standard drinks of alcohol    Types: 4 Standard drinks or equivalent per week    Comment: on occasion     Current Outpatient Medications:    aspirin EC 81 MG tablet, Take 1 tablet (81 mg total) by mouth daily. Swallow whole., Disp: 100 tablet, Rfl: 1   Cyanocobalamin (B-12) 500 MCG TABS, Take 2,500 mcg by mouth daily. , Disp: , Rfl:    diclofenac Sodium (VOLTAREN) 1 % GEL, Apply 2  g topically 4 (four) times daily as needed (for chronic pain management)., Disp: 100 g, Rfl: 5   Gluc-Chonn-MSM-Boswellia-Vit D (GLUCOSAMINE CHONDROITIN + D3 PO), Take by mouth., Disp: , Rfl:    meloxicam (MOBIC) 15 MG tablet, Take 0.5-1 tablets (7.5-15 mg total) by mouth daily., Disp: 30 tablet, Rfl: 1   Multiple Vitamin (MULTIVITAMIN) tablet, Take 1 tablet by mouth daily. Centrum Silver, Disp: , Rfl:    Omega-3 Fatty Acids (FISH OIL PO), Take 1,200 mg daily by mouth. , Disp: , Rfl:    rosuvastatin (CRESTOR) 10 MG tablet, Take 1 tablet (10 mg total) by mouth daily., Disp: 90 tablet, Rfl: 0   triamcinolone (KENALOG) 0.025 % ointment, Apply 1 Application topically 2 (two) times daily as needed., Disp: 30 g, Rfl: 0  Allergies  Allergen Reactions   Adhesive [Tape]     Rash/dermatitis    I personally reviewed active problem list, medication list, allergies, family history, social history, health maintenance with the patient/caregiver today.   ROS  Ten systems reviewed and is negative except as mentioned in HPI   Objective  Vitals:   04/14/22 1049  BP: 136/72  Pulse: 72  Resp: 16  Temp: 97.7 F (36.5 C)  TempSrc: Oral  SpO2: 99%  Weight: 226 lb 4.8 oz (102.6 kg)  Height: 5\' 6"  (1.676 m)    Body mass index is 36.53 kg/m.  Physical Exam  Constitutional: Patient appears well-developed and well-nourished. Obese  No distress.  HEENT: head atraumatic, normocephalic, pupils equal and reactive to light, neck supple Cardiovascular: Normal rate, regular rhythm and normal heart sounds.  No murmur heard. No BLE edema. Pulmonary/Chest: Effort normal and breath sounds normal. No respiratory distress. Abdominal: Soft.  There is no tenderness. Psychiatric: Patient has a normal mood and affect. behavior is normal. Judgment and thought content normal.   Recent Results (from the past 2160 hour(s))  Lipid panel     Status: Abnormal   Collection Time: 03/31/22 11:47 AM  Result Value Ref  Range   Cholesterol 163 <200 mg/dL   HDL 77 > OR = 50 mg/dL   Triglycerides 183 (H) <150 mg/dL   LDL Cholesterol (Calc) 60 mg/dL (calc)    Comment: Reference range: <100 . Desirable range <100 mg/dL for primary prevention;   <70 mg/dL for patients with CHD or diabetic patients  with > or = 2 CHD risk factors. Marland Kitchen LDL-C is now calculated using the Martin-Hopkins  calculation, which is a validated novel method providing  better accuracy than the Friedewald equation in the  estimation of LDL-C.  Cresenciano Genre et al. Annamaria Helling. [id number]): 2061-2068  (http://education.[employer].com/faq/FAQ164)    Total CHOL/HDL Ratio 2.1 <5.0 (  calc)   Non-HDL Cholesterol (Calc) 86 <130 mg/dL (calc)    Comment: For patients with diabetes plus 1 major ASCVD risk  factor, treating to a non-HDL-C goal of <100 mg/dL  (LDL-C of <70 mg/dL) is considered a therapeutic  option.   CBC with Differential/Platelet     Status: None   Collection Time: 03/31/22 11:47 AM  Result Value Ref Range   WBC 8.2 3.8 - 10.8 Thousand/uL   RBC 4.61 3.80 - 5.10 Million/uL   Hemoglobin 13.4 11.7 - 15.5 g/dL   HCT 39.8 35.0 - 45.0 %   MCV 86.3 80.0 - 100.0 fL   MCH 29.1 27.0 - 33.0 pg   MCHC 33.7 32.0 - 36.0 g/dL   RDW 14.0 11.0 - 15.0 %   Platelets 317 140 - 400 Thousand/uL   MPV 11.1 7.5 - 12.5 fL   Neutro Abs 4,986 1,500 - 7,800 cells/uL   Lymphs Abs 2,452 850 - 3,900 cells/uL   Absolute Monocytes 517 200 - 950 cells/uL   Eosinophils Absolute 148 15 - 500 cells/uL   Basophils Absolute 98 0 - 200 cells/uL   Neutrophils Relative % 60.8 %   Total Lymphocyte 29.9 %   Monocytes Relative 6.3 %   Eosinophils Relative 1.8 %   Basophils Relative 1.2 %  COMPLETE METABOLIC PANEL WITH GFR     Status: None   Collection Time: 03/31/22 11:47 AM  Result Value Ref Range   Glucose, Bld 87 65 - 99 mg/dL    Comment: .            Fasting reference interval .    BUN 17 7 - 25 mg/dL   Creat 1.00 0.60 - 1.00 mg/dL   eGFR 60 > OR =  60 mL/min/1.30m2   BUN/Creatinine Ratio SEE NOTE: 6 - 22 (calc)    Comment:    Not Reported: BUN and Creatinine are within    reference range. .    Sodium 141 135 - 146 mmol/L   Potassium 5.1 3.5 - 5.3 mmol/L   Chloride 104 98 - 110 mmol/L   CO2 26 20 - 32 mmol/L   Calcium 9.6 8.6 - 10.4 mg/dL   Total Protein 6.7 6.1 - 8.1 g/dL   Albumin 4.1 3.6 - 5.1 g/dL   Globulin 2.6 1.9 - 3.7 g/dL (calc)   AG Ratio 1.6 1.0 - 2.5 (calc)   Total Bilirubin 0.3 0.2 - 1.2 mg/dL   Alkaline phosphatase (APISO) 55 37 - 153 U/L   AST 13 10 - 35 U/L   ALT 9 6 - 29 U/L  TSH     Status: None   Collection Time: 03/31/22 11:47 AM  Result Value Ref Range   TSH 0.99 0.40 - 4.50 mIU/L  Hemoglobin A1c     Status: Abnormal   Collection Time: 03/31/22 11:47 AM  Result Value Ref Range   Hgb A1c MFr Bld 5.8 (H) <5.7 % of total Hgb    Comment: For someone without known diabetes, a hemoglobin  A1c value between 5.7% and 6.4% is consistent with prediabetes and should be confirmed with a  follow-up test. . For someone with known diabetes, a value <7% indicates that their diabetes is well controlled. A1c targets should be individualized based on duration of diabetes, age, comorbid conditions, and other considerations. . This assay result is consistent with an increased risk of diabetes. . Currently, no consensus exists regarding use of hemoglobin A1c for diagnosis of diabetes for children. Marland Kitchen  Mean Plasma Glucose 120 mg/dL   eAG (mmol/L) 6.6 mmol/L    Comment: . This test was performed on the Roche cobas c503 platform. Effective 10/26/21, a change in test platforms from the [employer] Architect to the Roche cobas c503 may have shifted HbA1c results compared to historical results. Based on laboratory validation testing conducted at [place] relative to the Abbott platform had an average increase in HbA1c value of < or = 0.3%. This difference is within accepted  variability established by  the [facility]. Note that not all individuals will have had a shift in their results and direct comparisons between historical and current results for testing conducted on different platforms is not recommended.     PHQ2/9:    04/14/2022   10:52 AM 03/31/2022   10:52 AM 12/09/2021    9:15 AM 11/02/2021    9:44 AM 07/29/2021    8:10 AM  Depression screen PHQ 2/9  Decreased Interest 0 0 0 0 1  Down, Depressed, Hopeless 1 1 0 0 1  PHQ - 2 Score 1 1 0 0 2  Altered sleeping 0 0 0 0 0  Tired, decreased energy 0 0 0 0 0  Change in appetite 1 1 0 0 0  Feeling bad or failure about yourself  0 0 0 0 0  Trouble concentrating 0 0 0 0 0  Moving slowly or fidgety/restless 0 0 0 0 0  Suicidal thoughts 0 0 0 0 0  PHQ-9 Score 2 2 0 0 2  Difficult doing work/chores Not difficult at all Not difficult at all Not difficult at all Not difficult at all     phq 9 is negative   Fall Risk:    04/14/2022   10:52 AM 03/31/2022   10:52 AM 12/09/2021    9:14 AM 11/02/2021    9:44 AM 07/29/2021    8:09 AM  Fall Risk   Falls in the past year? 0 0 0 0 0  Number falls in past yr:   0 0 0  Injury with Fall?   0 0 0  Risk for fall due to : No Fall Risks No Fall Risks No Fall Risks No Fall Risks No Fall Risks  Follow up Falls prevention discussed Falls prevention discussed Falls prevention discussed;Education provided;Falls evaluation completed Falls prevention discussed;Education provided Falls prevention discussed      Functional Status Survey: Is the patient deaf or have difficulty hearing?: No Does the patient have difficulty seeing, even when wearing glasses/contacts?: No Does the patient have difficulty concentrating, remembering, or making decisions?: No Does the patient have difficulty walking or climbing stairs?: No Does the patient have difficulty dressing or bathing?: No Does the patient have difficulty doing errands alone such as visiting a doctor's  office or shopping?: No    Assessment & Plan   1. Abnormal EKG  - Ambulatory referral to Cardiology  2. Aortic atherosclerosis ([facility])  Continue statin therapy and aspirin   3. Centrilobular emphysema ([facility])  Trying to quit smoking   4. Paresthesia of right lower extremity  Reassurance given, seems to be a peripheral nerve on lateral right lower leg    5. Morbid obesity ([place])  Discussed with the patient the risk posed by an increased BMI. Discussed importance of portion control, calorie counting and at least 150 minutes of physical activity weekly. Avoid sweet beverages and drink more water. Eat at least 6 servings of fruit and vegetables daily

## 2022-04-14 ENCOUNTER — Encounter: Payer: Self-pay | Admitting: Family Medicine

## 2022-04-14 ENCOUNTER — Ambulatory Visit (INDEPENDENT_AMBULATORY_CARE_PROVIDER_SITE_OTHER): Payer: Medicare Other | Admitting: Family Medicine

## 2022-04-14 VITALS — BP 136/72 | HR 72 | Temp 97.7°F | Resp 16 | Ht 66.0 in | Wt 226.3 lb

## 2022-04-14 DIAGNOSIS — R202 Paresthesia of skin: Secondary | ICD-10-CM | POA: Diagnosis not present

## 2022-04-14 DIAGNOSIS — R9431 Abnormal electrocardiogram [ECG] [EKG]: Secondary | ICD-10-CM | POA: Diagnosis not present

## 2022-04-14 DIAGNOSIS — J432 Centrilobular emphysema: Secondary | ICD-10-CM

## 2022-04-14 DIAGNOSIS — I700 Atherosclerosis of aorta: Secondary | ICD-10-CM

## 2022-04-15 ENCOUNTER — Encounter: Payer: Self-pay | Admitting: Family Medicine

## 2022-05-03 ENCOUNTER — Other Ambulatory Visit: Payer: Self-pay | Admitting: Internal Medicine

## 2022-05-03 ENCOUNTER — Encounter: Payer: Self-pay | Admitting: Internal Medicine

## 2022-05-03 DIAGNOSIS — R9431 Abnormal electrocardiogram [ECG] [EKG]: Secondary | ICD-10-CM

## 2022-05-03 DIAGNOSIS — R0789 Other chest pain: Secondary | ICD-10-CM

## 2022-05-13 ENCOUNTER — Encounter (HOSPITAL_COMMUNITY): Payer: Self-pay

## 2022-05-13 ENCOUNTER — Other Ambulatory Visit (HOSPITAL_COMMUNITY): Payer: Self-pay | Admitting: *Deleted

## 2022-05-13 MED ORDER — METOPROLOL TARTRATE 50 MG PO TABS: ORAL_TABLET | ORAL | 0 refills | Status: DC

## 2022-05-14 ENCOUNTER — Telehealth (HOSPITAL_COMMUNITY): Payer: Self-pay | Admitting: *Deleted

## 2022-05-14 NOTE — Telephone Encounter (Signed)
Reaching out to patient to offer assistance regarding upcoming cardiac imaging study; pt verbalizes understanding of appt date/time, parking situation and where to check in, pre-test NPO status and medications ordered, and verified current allergies; name and call back number provided for further questions should they arise  Chyann Ambrocio RN Navigator Cardiac Imaging [facility] [phone] office [phone] cell  Patient to take 50mg metoprolol tartrate two hours prior to her cardiac CT scan.  

## 2022-05-17 ENCOUNTER — Ambulatory Visit
Admission: RE | Admit: 2022-05-17 | Discharge: 2022-05-17 | Disposition: A | Discharge status: Home / Self Care | Payer: Medicare Other | Source: Ambulatory Visit | Attending: Internal Medicine | Admitting: Internal Medicine

## 2022-05-17 DIAGNOSIS — R0789 Other chest pain: Secondary | ICD-10-CM

## 2022-05-17 DIAGNOSIS — R9431 Abnormal electrocardiogram [ECG] [EKG]: Secondary | ICD-10-CM | POA: Diagnosis present

## 2022-05-17 MED ORDER — NITROGLYCERIN 0.4 MG SL SUBL
0.8000 mg | SUBLINGUAL_TABLET | Freq: Once | SUBLINGUAL | Status: AC
  Administered 2022-05-17: 0.8 mg via SUBLINGUAL
  Filled 2022-05-17: qty 25

## 2022-05-17 MED ORDER — IOHEXOL 350 MG/ML SOLN
100.0000 mL | Freq: Once | INTRAVENOUS | Status: AC | PRN
  Administered 2022-05-17: 100 mL via INTRAVENOUS

## 2022-05-17 NOTE — Progress Notes (Signed)
Patient tolerated procedure well. Ambulate w/o difficulty. Denies light headedness or being dizzy. Encouraged to drink extra water today and reasoning explained. Verbalized understanding. All questions answered. ABC intact. No further needs. Discharge from procedure area w/o issues.   

## 2022-05-26 ENCOUNTER — Ambulatory Visit (INDEPENDENT_AMBULATORY_CARE_PROVIDER_SITE_OTHER): Payer: Medicare Other | Admitting: Family Medicine

## 2022-05-26 ENCOUNTER — Encounter: Payer: Self-pay | Admitting: Family Medicine

## 2022-05-26 VITALS — BP 122/74 | HR 77 | Temp 98.1°F | Resp 16 | Ht 66.0 in | Wt 221.0 lb

## 2022-05-26 DIAGNOSIS — J329 Chronic sinusitis, unspecified: Secondary | ICD-10-CM | POA: Diagnosis not present

## 2022-05-26 DIAGNOSIS — J40 Bronchitis, not specified as acute or chronic: Secondary | ICD-10-CM | POA: Diagnosis not present

## 2022-05-26 MED ORDER — BENZONATATE 100 MG PO CAPS: 100.0000 mg | ORAL_CAPSULE | Freq: Three times a day (TID) | ORAL | 0 refills | Status: DC | PRN

## 2022-05-26 MED ORDER — AZITHROMYCIN 250 MG PO TABS: ORAL_TABLET | ORAL | 0 refills | Status: DC

## 2022-05-26 MED ORDER — ALBUTEROL SULFATE HFA 108 (90 BASE) MCG/ACT IN AERS: 2.0000 | INHALATION_SPRAY | RESPIRATORY_TRACT | 4 refills | Status: AC | PRN

## 2022-05-26 NOTE — Patient Instructions (Addendum)
For worse nasal, sinus or allergy symptoms with seasonal or weather changes you may always try adjusting or starting allergy meds - zyrtec, claritin, allegra, xyzal or a combo of two is very safe.  You can adjust or start nasal sprays - steroid intranasal sprays (flonase nasonex, nasocort), try saline nasal spray, or try adding an antihistamine nasal spray.    Depending on your health and vital signs, you may be able to try over-the-counter cold, cough or congestion medications such as Mucinex, Coricidin, Sudafed behind the counter, or even Tylenol or NSAIDs for minor discomfort with sinus pressure or throat irritation.   Allergies and viruses can both cause increased nasal symptoms and sinus pain and congestion, and antibiotics do not treat either of these causes of your symptoms.  Less than 2% of people with symptoms in medical studies had bacterial sinus infection needing antibiotic treatment.    Antibiotics are only indicated for acute bacterial sinusitis which is diagnosed with increased nasal or sinus symptoms that started to improve and then suddenly worsened with severe pain and pressure tenderness on exam and fever, or with prolonged symptoms after conservative management (everything described above) with moderate to severe symptoms which last longer than 10 to 12 days.  There are of course exceptions to this, including some people with immonosuppression who will need antibiotics sooner than every one else, or some people who have complicated ENT history with prior procedures and recurrent infections.    Allergic Rhinitis, Adult Allergic rhinitis is a reaction to allergens. Allergens are things that can cause an allergic reaction. This condition affects the lining inside the nose (mucous membrane). There are two types of allergic rhinitis: Seasonal. This type is also called hay fever. It happens only during some times of the year. Perennial. This type can happen at any time of the year. This  condition cannot be spread from person to person (is not contagious). It can be mild, worse, or very bad. It can develop at any age and may be outgrown. What are the causes? This condition may be caused by: Pollen from grasses, trees, and weeds. Dust mites. Smoke. Mold. Car fumes. The pee (urine), spit, or dander of pets. Dander is dead skin cells from a pet. What increases the risk? You are more likely to develop this condition if: You have allergies in your family. You have problems like allergies in your family. You may have: Swelling of parts of your eyes and eyelids. Asthma. This affects how you breathe. Long-term redness and swelling on your skin. Food allergies. What are the signs or symptoms? The main symptom of this condition is a runny or stuffy nose (nasal congestion). Other symptoms may include: Sneezing or coughing. Itching and tearing of your eyes. Mucus that drips down the back of your throat (postnasal drip). Trouble sleeping. Feeling tired. Headache. Sore throat. How is this treated? There is no cure for this condition. You should avoid things that you are allergic to. Treatment can help to relieve symptoms. This may include: Medicines that block allergy symptoms, such as corticosteroids or antihistamines. These may be given as a shot, nasal spray, or pill. Avoiding things you are allergic to. Medicines that give you bits of what you are allergic to over time. This is called immunotherapy. It is done if other treatments do not help. You may get: Shots. Medicine under your tongue. Stronger medicines, if other treatments do not help. Follow these instructions at home: Avoiding allergens Find out what things you are allergic to  and avoid them. To do this, try these things: If you get allergies any time of year: Replace carpet with wood, tile, or vinyl flooring. Carpet can trap pet dander and dust. Do not smoke. Do not allow smoking in your home. Change your  heating and air conditioning filters at least once a month. If you get allergies only some times of the year: Keep windows closed when you can. Plan things to do outside when pollen counts are lowest. Check pollen counts before you plan things to do outside. When you come indoors, change your clothes and shower before you sit on furniture or bedding. If you are allergic to a pet: Keep the pet out of your bedroom. Vacuum, sweep, and dust often.  General instructions Take over-the-counter and prescription medicines only as told by your doctor. Drink enough fluid to keep your pee (urine) pale yellow. Keep all follow-up visits as told by your doctor. This is important. Where to find more information American Academy of Allergy, Asthma & Immunology: www.aaaai.org Contact a doctor if: You have a fever. You get a cough that does not go away. You make whistling sounds when you breathe (wheeze). Your symptoms slow you down. Your symptoms stop you from doing your normal things each day. Get help right away if: You are short of breath. This symptom may be an emergency. Do not wait to see if the symptom will go away. Get medical help right away. Call your local emergency services (911 in the [place] Do not drive yourself to the hospital. Summary Allergic rhinitis may be treated by taking medicines and avoiding things you are allergic to. If you have allergies only some of the year, keep windows closed when you can at those times. Contact your doctor if you get a fever or a cough that does not go away. This information is not intended to replace advice given to you by your health care provider. Make sure you discuss any questions you have with your health care provider. Document Revised: 02/26/2019 Document Reviewed: 01/02/2019 Elsevier Patient Education  2023 [employer].

## 2022-05-26 NOTE — Progress Notes (Signed)
Patient ID: Allison Phillips, female    DOB: 1950-07-25, 72 y.o.   MRN: [id number]  PCP: Danelle Berry, PA-C  Chief Complaint  Patient presents with   Nasal Congestion    Chest congestion   Cough    Coughing up lots of phlegm since Saturday    Subjective:   Allison Phillips is a 72 y.o. female, presents to clinic with CC of the following:  HPI  10+ days nasal drainage and post nasal drip and worsening cough  Tried cordicidin and dayquil and nyquil  She is not having any pain with breathing but coughing fits are worsening as well as the productive sputum She does not feel short of breath She does have history of central lobar emphysema and tobacco abuse She has had bronchitis a few times over the past couple years and will use inhalers        Patient Active Problem List   Diagnosis Date Noted   Centrilobular emphysema (HCC) 04/14/2022   OSA (obstructive sleep apnea) 09/23/2020   Aortic atherosclerosis (HCC) 11/14/2019   Obesity (BMI 35.0-39.9 without comorbidity) 01/06/2018   Osteopenia 04/20/2017   Morbid obesity (HCC) 12/30/2016   Essential hypertension, benign 03/08/2016   Tobacco abuse       Current Outpatient Medications:    aspirin EC 81 MG tablet, Take 1 tablet (81 mg total) by mouth daily. Swallow whole., Disp: 100 tablet, Rfl: 1   Cyanocobalamin (B-12) 500 MCG TABS, Take 2,500 mcg by mouth daily. , Disp: , Rfl:    diclofenac Sodium (VOLTAREN) 1 % GEL, Apply 2 g topically 4 (four) times daily as needed (for chronic pain management)., Disp: 100 g, Rfl: 5   Gluc-Chonn-MSM-Boswellia-Vit D (GLUCOSAMINE CHONDROITIN + D3 PO), Take by mouth., Disp: , Rfl:    meloxicam (MOBIC) 15 MG tablet, Take 0.5-1 tablets (7.5-15 mg total) by mouth daily., Disp: 30 tablet, Rfl: 1   metoprolol tartrate (LOPRESSOR) 50 MG tablet, Take tablet (50mg ) TWO hours prior to your cardiac CT scan., Disp: 1 tablet, Rfl: 0   Multiple Vitamin (MULTIVITAMIN) tablet, Take 1 tablet by mouth  daily. Centrum Silver, Disp: , Rfl:    Omega-3 Fatty Acids (FISH OIL PO), Take 1,200 mg daily by mouth. , Disp: , Rfl:    rosuvastatin (CRESTOR) 10 MG tablet, Take 1 tablet (10 mg total) by mouth daily., Disp: 90 tablet, Rfl: 0   triamcinolone (KENALOG) 0.025 % ointment, Apply 1 Application topically 2 (two) times daily as needed., Disp: 30 g, Rfl: 0   Allergies  Allergen Reactions   Adhesive [Tape]     Rash/dermatitis     Social History   Tobacco Use   Smoking status: Every Day    Packs/day: 0.50    Years: 15.00    Additional pack years: 0.00    Total pack years: 7.50    Types: Cigarettes    Last attempt to quit: 10/24/2016    Years since quitting: 5.5   Smokeless tobacco: Never   Tobacco comments:    8-10 cigarettes daily- 06/09/2021  Vaping Use   Vaping Use: Never used  Substance Use Topics   Alcohol use: Yes    Alcohol/week: 4.0 standard drinks of alcohol    Types: 4 Standard drinks or equivalent per week    Comment: on occasion   Drug use: No      Chart Review Today: I personally reviewed active problem list, medication list, allergies, family history, social history, health maintenance, notes from last encounter,  lab results, imaging with the patient/caregiver today.   Review of Systems  Constitutional: Negative.   HENT: Negative.    Eyes: Negative.   Respiratory: Negative.    Cardiovascular: Negative.   Gastrointestinal: Negative.   Endocrine: Negative.   Genitourinary: Negative.   Musculoskeletal: Negative.   Skin: Negative.   Allergic/Immunologic: Negative.   Neurological: Negative.   Hematological: Negative.   Psychiatric/Behavioral: Negative.    All other systems reviewed and are negative.      Objective:   Vitals:   05/26/22 0944  BP: 122/74  Pulse: 77  Resp: 16  Temp: 98.1 F (36.7 C)  TempSrc: Oral  SpO2: 97%  Weight: 221 lb (100.2 kg)  Height: 5\' 6"  (1.676 m)    Body mass index is 35.67 kg/m.  Physical Exam Vitals and nursing  note reviewed.  Constitutional:      General: She is not in acute distress.    Appearance: She is well-developed. She is not ill-appearing, toxic-appearing or diaphoretic.  HENT:     Head: Normocephalic and atraumatic.     Right Ear: Hearing, tympanic membrane, ear canal and external ear normal.     Left Ear: Hearing, tympanic membrane, ear canal and external ear normal.     Nose: Mucosal edema, congestion and rhinorrhea present.     Right Sinus: No maxillary sinus tenderness or frontal sinus tenderness.     Left Sinus: No maxillary sinus tenderness or frontal sinus tenderness.     Mouth/Throat:     Mouth: Mucous membranes are moist. Mucous membranes are not pale.     Pharynx: Uvula midline. No oropharyngeal exudate or uvula swelling.     Tonsils: No tonsillar abscesses.  Eyes:     General: No scleral icterus.       Right eye: No discharge.        Left eye: No discharge.     Conjunctiva/sclera: Conjunctivae normal.  Neck:     Trachea: No tracheal deviation.  Cardiovascular:     Rate and Rhythm: Normal rate and regular rhythm.     Pulses: Normal pulses.     Heart sounds: Normal heart sounds.  Pulmonary:     Effort: Pulmonary effort is normal. No respiratory distress.     Breath sounds: Normal breath sounds. No stridor. No wheezing, rhonchi or rales.     Comments: Intermittent coughing, able to speak in full and complete sentences Abdominal:     General: Bowel sounds are normal. There is no distension.     Palpations: Abdomen is soft.  Musculoskeletal:        General: Normal range of motion.     Cervical back: Normal range of motion and neck supple.  Skin:    General: Skin is warm and dry.     Coloration: Skin is not pale.     Findings: No rash.  Neurological:     Mental Status: She is alert.     Motor: No abnormal muscle tone.     Coordination: Coordination normal.  Psychiatric:        Behavior: Behavior normal.      Results for orders placed or performed in visit on  03/31/22  Lipid panel  Result Value Ref Range   Cholesterol 163 <200 mg/dL   HDL 77 > OR = 50 mg/dL   Triglycerides 098 (H) <150 mg/dL   LDL Cholesterol (Calc) 60 mg/dL (calc)   Total CHOL/HDL Ratio 2.1 <5.0 (calc)   Non-HDL Cholesterol (Calc) 86 <119 mg/dL (calc)  CBC with Differential/Platelet  Result Value Ref Range   WBC 8.2 3.8 - 10.8 Thousand/uL   RBC 4.61 3.80 - 5.10 Million/uL   Hemoglobin 13.4 11.7 - 15.5 g/dL   HCT 40.9 81.1 - 91.4 %   MCV 86.3 80.0 - 100.0 fL   MCH 29.1 27.0 - 33.0 pg   MCHC 33.7 32.0 - 36.0 g/dL   RDW 78.2 95.6 - 21.3 %   Platelets 317 140 - 400 Thousand/uL   MPV 11.1 7.5 - 12.5 fL   Neutro Abs 4,986 1,500 - 7,800 cells/uL   Lymphs Abs 2,452 850 - 3,900 cells/uL   Absolute Monocytes 517 200 - 950 cells/uL   Eosinophils Absolute 148 15 - 500 cells/uL   Basophils Absolute 98 0 - 200 cells/uL   Neutrophils Relative % 60.8 %   Total Lymphocyte 29.9 %   Monocytes Relative 6.3 %   Eosinophils Relative 1.8 %   Basophils Relative 1.2 %  COMPLETE METABOLIC PANEL WITH GFR  Result Value Ref Range   Glucose, Bld 87 65 - 99 mg/dL   BUN 17 7 - 25 mg/dL   Creat 0.86 5.78 - 4.69 mg/dL   eGFR 60 > OR = 60 GE/XBM/8.41L2   BUN/Creatinine Ratio SEE NOTE: 6 - 22 (calc)   Sodium 141 135 - 146 mmol/L   Potassium 5.1 3.5 - 5.3 mmol/L   Chloride 104 98 - 110 mmol/L   CO2 26 20 - 32 mmol/L   Calcium 9.6 8.6 - 10.4 mg/dL   Total Protein 6.7 6.1 - 8.1 g/dL   Albumin 4.1 3.6 - 5.1 g/dL   Globulin 2.6 1.9 - 3.7 g/dL (calc)   AG Ratio 1.6 1.0 - 2.5 (calc)   Total Bilirubin 0.3 0.2 - 1.2 mg/dL   Alkaline phosphatase (APISO) 55 37 - 153 U/L   AST 13 10 - 35 U/L   ALT 9 6 - 29 U/L  TSH  Result Value Ref Range   TSH 0.99 0.40 - 4.50 mIU/L  Hemoglobin A1c  Result Value Ref Range   Hgb A1c MFr Bld 5.8 (H) <5.7 % of total Hgb   Mean Plasma Glucose 120 mg/dL   eAG (mmol/L) 6.6 mmol/L       Assessment & Plan:   1. Bronchitis Encouraged her to use Mucinex, treat  her nasal symptoms and postnasal drip as noted below and in after visit summary For coughing fits she can try Tessalon Perles, over-the-counter Robitussin or Delsym, and use her inhaler see if this limits coughing fits With history of bronchitis and emphysema a Z-Pak may be helpful for its anti-inflammatory effect  If she has any worsening she was encouraged to follow-up for recheck -at this time a do not suspect that she has any community-acquired pneumonia and symptoms are mild enough that with dressing nasal symptoms and with use of Mucinex and inhalers she may not have to take prednisone  - albuterol (VENTOLIN HFA) 108 (90 Base) MCG/ACT inhaler; Inhale 2 puffs into the lungs every 4 (four) hours as needed for wheezing or shortness of breath.  Dispense: 1 each; Refill: 4 - benzonatate (TESSALON) 100 MG capsule; Take 1 capsule (100 mg total) by mouth 3 (three) times daily as needed for cough.  Dispense: 30 capsule; Refill: 0 - azithromycin (ZITHROMAX) 250 MG tablet; Take 2 tabs (500 mg) PO qd x 1, then take 1 tab (250 mg) PO qd for day 2-5  Dispense: 6 each; Refill: 0  2. Rhinosinusitis Patient encouraged to take  her adjusted daily antihistamine such as Zyrtec Claritin Allegra or Xyzal to decrease postnasal drip which is likely causing a good amount of her cough and nighttime symptoms also encouraged to try Flonase Nasonex or Nasacort She does not currently have any fever or sinus tenderness to palpation so I do not feel she needs antibiotics for a bacterial sinus infection at this time    Follow-up if not improving in the next 1 to 2 weeks follow-up sooner if any worsening  Danelle Berry, PA-C 05/26/22 10:01 AM

## 2022-06-02 ENCOUNTER — Encounter: Payer: Self-pay | Admitting: Pulmonary Disease

## 2022-06-02 ENCOUNTER — Ambulatory Visit (INDEPENDENT_AMBULATORY_CARE_PROVIDER_SITE_OTHER): Payer: Medicare Other | Admitting: Pulmonary Disease

## 2022-06-02 VITALS — BP 138/84 | HR 63 | Temp 97.9°F | Ht 66.0 in | Wt 221.0 lb

## 2022-06-02 DIAGNOSIS — Z720 Tobacco use: Secondary | ICD-10-CM | POA: Diagnosis not present

## 2022-06-02 DIAGNOSIS — J432 Centrilobular emphysema: Secondary | ICD-10-CM | POA: Diagnosis not present

## 2022-06-02 DIAGNOSIS — G4733 Obstructive sleep apnea (adult) (pediatric): Secondary | ICD-10-CM | POA: Diagnosis not present

## 2022-06-02 NOTE — Patient Instructions (Signed)
Call if your cough doesn't get better over the next couple of weeks  Follow up in 1 year

## 2022-06-02 NOTE — Progress Notes (Signed)
[facility]  Chief Complaint  Patient presents with   Follow-up    CPAP is good. No Mask or pressure problems. Congestion for 2 weeks and has not did not use her CPAP some days.    Constitutional:  BP 138/84 (BP Location: Left Arm, Cuff Size: Large)   Pulse 63   Temp 97.9 F (36.6 C)   Ht 5\' 6"  (1.676 m)   Wt 221 lb (100.2 kg)   LMP  (LMP Unknown) Comment: rt ovary removed  SpO2 99%   BMI 35.67 kg/m   Past Medical History:  Depression, Diverticulitis, Hepatic abscess, HTN, Hip pain, Back pain, Osteopenia, Osteoporosis, Pre-DM, GERD  Past Surgical History:  She  has a past surgical history that includes Anal fissure repair; Cesarean section (1610); Appendectomy; Colonoscopy with propofol (N/A, 12/18/2014); Lipoma excision (07/2015); Lipoma excision (11/19/2015); Oophorectomy; and Colonoscopy with propofol (N/A, 02/17/2018).  Brief Summary:  Allison Phillips is a 72 y.o. female smoker with obstructive sleep apnea and pulmonary emphysema.      Subjective:   She developed bronchitis earlier this month.  Had cough with green sputum and sinus congestion.  Saw her PCP and started on Zpak.  Slowly improving.  Not having fever, wheeze, or hemoptysis.  Hasn't needed to use albuterol much.  Has uses mucinex some.    Was using CPAP nightly until she got bronchitis.  Started using again recently.  No issues with mask fit or pressure setting.  Feels like the air gets too hot sometimes.  She is down to 4 cigarettes per day.  Physical Exam:   Appearance - well kempt   ENMT - no sinus tenderness, no oral exudate, no LAN, Mallampati 3 airway, no stridor  Respiratory - equal breath sounds bilaterally, no wheezing or rales  CV - s1s2 regular rate and rhythm, no murmurs  Ext - no clubbing, no edema  Skin - no rashes  Psych - normal mood and affect    Pulmonary Tests:    Chest imaging:  LDCT chest 03/13/22 >> atherosclerosis, mild  bronchial wall thickening and emphysema, liver cyst  Sleep Tests:  HST 09/04/20 >> AHI 16.9, SpO2 low 90%  Auto CPAP 04/30/22 to 05/29/22 >> used on 23 of 30 nights with average 6 hrs 25 min.  Average AHI 2.4 with median CPAP 7 and 95 th percentile CPAP 9 cm H2O  Cardiac Tests:  Echo 01/20/19 >> EF 60 to 65%, mild LVH  Social History:  She  reports that she has been smoking cigarettes. She has a 7.50 pack-year smoking history. She has never used smokeless tobacco. She reports current alcohol use of about 4.0 standard drinks of alcohol per week. She reports that she does not use drugs.  Family History:  Her family history includes Alzheimer's disease in her mother; Hypertension in her mother; Kidney disease in her mother; Lung cancer in her brother; Rectal cancer in her brother; Thyroid cancer in her son.     Assessment/Plan:   Obstructive sleep apnea. - she is compliant with CPAP and reports benefit from therapy - she uses [employer] for her DME - current CPAP ordered September 2022 - continue auto CPAP 5 to 15 cm H2O  Mild centrilobular and paraseptal emphysema. - discussed symptoms to monitor for that would indicate need for additional assessment and therapy - continue prn albuterol  Tobacco abuse. - she will continue to gradually quit cigarettes and has been making good progress - she will have follow  up low dose CT chest in February 2025  Acute bronchitis. - slowly improving - continue prn mucinex, ventolin - don't think she needs prednisone or additional antibiotics at this time  Time Spent Involved in Patient Care on Day of Examination:  37 minutes  Follow up:   Patient Instructions  Call if your cough doesn't get better over the next couple of weeks  Follow up in 1 year  Medication List:   Allergies as of 06/02/2022       Reactions   Adhesive [tape]    Rash/dermatitis        Medication List        Accurate as of Jun 02, 2022  2:40 PM. If you have any  questions, ask your nurse or doctor.          STOP taking these medications    azithromycin 250 MG tablet Commonly known as: ZITHROMAX Stopped by: Coralyn Helling, MD       TAKE these medications    albuterol 108 (90 Base) MCG/ACT inhaler Commonly known as: VENTOLIN HFA Inhale 2 puffs into the lungs every 4 (four) hours as needed for wheezing or shortness of breath.   aspirin EC 81 MG tablet Take 1 tablet (81 mg total) by mouth daily. Swallow whole.   B-12 500 MCG Tabs Take 2,500 mcg by mouth daily.   benzonatate 100 MG capsule Commonly known as: TESSALON Take 1 capsule (100 mg total) by mouth 3 (three) times daily as needed for cough.   diclofenac Sodium 1 % Gel Commonly known as: VOLTAREN Apply 2 g topically 4 (four) times daily as needed (for chronic pain management).   FISH OIL PO Take 1,200 mg daily by mouth.   GLUCOSAMINE CHONDROITIN + D3 PO Take by mouth.   meloxicam 15 MG tablet Commonly known as: MOBIC Take 0.5-1 tablets (7.5-15 mg total) by mouth daily.   metoprolol tartrate 50 MG tablet Commonly known as: LOPRESSOR Take tablet (50mg ) TWO hours prior to your cardiac CT scan.   multivitamin tablet Take 1 tablet by mouth daily. Centrum Silver   rosuvastatin 10 MG tablet Commonly known as: Crestor Take 1 tablet (10 mg total) by mouth daily.   triamcinolone 0.025 % ointment Commonly known as: KENALOG Apply 1 Application topically 2 (two) times daily as needed.        Signature:  Coralyn Helling, MD [facility]monary[facility] Pager - [phone] 06/02/2022, 2:40 PM

## 2022-06-26 ENCOUNTER — Other Ambulatory Visit: Payer: Self-pay | Admitting: Family Medicine

## 2022-06-26 DIAGNOSIS — I10 Essential (primary) hypertension: Secondary | ICD-10-CM

## 2022-06-26 DIAGNOSIS — I700 Atherosclerosis of aorta: Secondary | ICD-10-CM

## 2022-07-22 ENCOUNTER — Encounter: Payer: Self-pay | Admitting: Family Medicine

## 2022-07-23 ENCOUNTER — Telehealth (INDEPENDENT_AMBULATORY_CARE_PROVIDER_SITE_OTHER): Payer: Medicare Other | Admitting: Family Medicine

## 2022-07-23 DIAGNOSIS — G4733 Obstructive sleep apnea (adult) (pediatric): Secondary | ICD-10-CM

## 2022-07-23 DIAGNOSIS — J029 Acute pharyngitis, unspecified: Secondary | ICD-10-CM

## 2022-07-23 DIAGNOSIS — Z20822 Contact with and (suspected) exposure to covid-19: Secondary | ICD-10-CM

## 2022-07-23 LAB — POCT RAPID STREP A (OFFICE): Rapid Strep A Screen: NEGATIVE

## 2022-07-23 MED ORDER — PANTOPRAZOLE SODIUM 40 MG PO TBEC: 40.0000 mg | DELAYED_RELEASE_TABLET | ORAL | 0 refills | Status: DC

## 2022-07-23 MED ORDER — DEXAMETHASONE 6 MG PO TABS: 6.0000 mg | ORAL_TABLET | Freq: Every day | ORAL | 0 refills | Status: AC

## 2022-07-23 NOTE — Addendum Note (Signed)
Addended by: Forde Radon on: 07/23/2022 02:00 PM   Modules accepted: Orders

## 2022-07-23 NOTE — Telephone Encounter (Signed)
Responded to other message sent to [facility]

## 2022-07-23 NOTE — Progress Notes (Signed)
Name: Allison Phillips   MRN: [id number]    DOB: 1950-11-27   Date:07/23/2022       Progress Note  Subjective:    Chief Complaint  Chief Complaint  Patient presents with   Sore Throat    All sx Onset for a week, last night was worst    Headache   Hoarse    I connected with  [name]  on 07/23/22 at 10:20 AM EDT by a video enabled telemedicine application and verified that I am speaking with the correct person using two identifiers.  I discussed the limitations of evaluation and management by telemedicine and the availability of in person appointments. The patient expressed understanding and agreed to proceed. Staff also discussed with the patient that there may be a patient responsible charge related to this service. Patient Location: home Provider Location: private office  Additional Individuals present: none  HPI Pt started to get sick with sore throat sx on and off for the past week  Her son is sick with COVID Pt took a test today and it was negative  She denies nasal/sinus congestion, is coughing a little bit, dry cough no wheeze or SOB Some lymph nodes under jaw are sore, no tenderness down neck This am her ST was worse - it was difficult to swallow a tylenol She has also tried mucinex w/o improvement to her sx. She thought initially her ST was due to CPAP machine and humidification settings/temp settings have been adjusted but not improving She does have hx of bronchitis, URI illness She denies reflux sx Last copd exacerbation was about 2 months ago     Patient Active Problem List   Diagnosis Date Noted   Centrilobular emphysema (HCC) 04/14/2022   OSA (obstructive sleep apnea) 09/23/2020   Aortic atherosclerosis (HCC) 11/14/2019   Obesity (BMI 35.0-39.9 without comorbidity) 01/06/2018   Osteopenia 04/20/2017   Morbid obesity (HCC) 12/30/2016   Essential hypertension, benign 03/08/2016   Tobacco abuse     Social History   Tobacco Use   Smoking status:  Every Day    Packs/day: 0.50    Years: 15.00    Additional pack years: 0.00    Total pack years: 7.50    Types: Cigarettes    Last attempt to quit: 10/24/2016    Years since quitting: 5.7   Smokeless tobacco: Never   Tobacco comments:    8-10 cigarettes daily- 06/09/2021  Substance Use Topics   Alcohol use: Yes    Alcohol/week: 4.0 standard drinks of alcohol    Types: 4 Standard drinks or equivalent per week    Comment: on occasion     Current Outpatient Medications:    albuterol (VENTOLIN HFA) 108 (90 Base) MCG/ACT inhaler, Inhale 2 puffs into the lungs every 4 (four) hours as needed for wheezing or shortness of breath., Disp: 1 each, Rfl: 4   aspirin EC 81 MG tablet, Take 1 tablet (81 mg total) by mouth daily. Swallow whole., Disp: 100 tablet, Rfl: 1   Cyanocobalamin (B-12) 500 MCG TABS, Take 2,500 mcg by mouth daily. , Disp: , Rfl:    Gluc-Chonn-MSM-Boswellia-Vit D (GLUCOSAMINE CHONDROITIN + D3 PO), Take by mouth., Disp: , Rfl:    meloxicam (MOBIC) 15 MG tablet, Take 0.5-1 tablets (7.5-15 mg total) by mouth daily., Disp: 30 tablet, Rfl: 1   Multiple Vitamin (MULTIVITAMIN) tablet, Take 1 tablet by mouth daily. Centrum Silver, Disp: , Rfl:    Omega-3 Fatty Acids (FISH OIL PO), Take 1,200 mg  daily by mouth. , Disp: , Rfl:    rosuvastatin (CRESTOR) 10 MG tablet, TAKE 1 TABLET(10 MG) BY MOUTH DAILY, Disp: 90 tablet, Rfl: 0   benzonatate (TESSALON) 100 MG capsule, Take 1 capsule (100 mg total) by mouth 3 (three) times daily as needed for cough. (Patient not taking: Reported on 07/23/2022), Disp: 30 capsule, Rfl: 0   diclofenac Sodium (VOLTAREN) 1 % GEL, Apply 2 g topically 4 (four) times daily as needed (for chronic pain management). (Patient not taking: Reported on 07/23/2022), Disp: 100 g, Rfl: 5   metoprolol tartrate (LOPRESSOR) 50 MG tablet, Take tablet (50mg ) TWO hours prior to your cardiac CT scan. (Patient not taking: Reported on 07/23/2022), Disp: 1 tablet, Rfl: 0   triamcinolone  (KENALOG) 0.025 % ointment, Apply 1 Application topically 2 (two) times daily as needed. (Patient not taking: Reported on 07/23/2022), Disp: 30 g, Rfl: 0   valsartan (DIOVAN) 80 MG tablet, TAKE 1 TABLET(80 MG) BY MOUTH DAILY (Patient not taking: Reported on 07/23/2022), Disp: 90 tablet, Rfl: 0  Allergies  Allergen Reactions   Adhesive [Tape]     Rash/dermatitis    I personally reviewed active problem list, medication list, allergies, family history, social history, health maintenance, notes from last encounter, lab results, imaging with the patient/caregiver today.   Review of Systems  Constitutional: Negative.  Negative for activity change, appetite change, chills, diaphoresis, fatigue and fever.  HENT:  Positive for sneezing, sore throat and trouble swallowing (pain, but able to swallow). Negative for congestion, postnasal drip and sinus pressure.   Eyes: Negative.   Respiratory:  Positive for cough. Negative for choking, chest tightness, shortness of breath and wheezing.   Cardiovascular: Negative.   Gastrointestinal: Negative.   Endocrine: Negative.   Genitourinary: Negative.   Musculoskeletal: Negative.   Skin: Negative.   Allergic/Immunologic: Negative.   Neurological: Negative.   Hematological: Negative.   Psychiatric/Behavioral: Negative.    All other systems reviewed and are negative.     Objective:   Virtual encounter, vitals limited, only able to obtain the following There were no vitals filed for this visit. There is no height or weight on file to calculate BMI. Nursing Note and Vital Signs reviewed.  Physical Exam Vitals and nursing note reviewed.  Constitutional:      General: She is not in acute distress.    Appearance: She is obese. She is not ill-appearing, toxic-appearing or diaphoretic.  Neck:     Trachea: Trachea and phonation normal.     Comments: Pt palpated her neck w/o cervial lymphadenopathy, she noted some submandibular ttp bilaterally Pulmonary:      Effort: No respiratory distress.     Comments: Speaking in full an complete sentences, no stridor, no audible wheeze Neurological:     Mental Status: She is alert.     PE limited by virtual encounter  No results found for this or any previous visit (from the past 72 hour(s)).  Assessment and Plan:     ICD-10-CM   1. Pharyngitis, unspecified etiology  J02.9    pt will do rapid strep test in office, I do suspect viral illness/possibly covid with exposure to + family member with severe ST as well  Encouraged her to manage with OTC meds, while sx are severe encouraged both tylenol and NSAID scheduled per dosing on box/bottle  Discussed steroids for sx - which I explained will likely only give brief sx improvement and will not affect overall timeline of illness (aka not likely to change when  she will recover from illness but may help sx for a few days) encouraged trial of PPI, can use OTC sore throat numbing sprays/lozenges I expect pharyngitis etiology to be self limiting and explained tx is supportive and symptomatic - only strep pharyngitis needs abx tx       2. Exposure to confirmed case of COVID-19  Z20.822    recommend doing second home covid test    3. OSA (obstructive sleep apnea)  G47.33    having trouble with CPAP settings/worsening her sx, encouraged her to f/up with managing specialists, may want to skip CPAP while acutely ill for few days       -Red flags and when to present for emergency care or RTC including inability to speak or swallow, chest pain, shortness of breath, new/worsening/un-resolving symptoms, reviewed with patient at time of visit. Follow up and care instructions discussed and provided in AVS. - I discussed the assessment and treatment plan with the patient. The patient was provided an opportunity to ask questions and all were answered. The patient agreed with the plan and demonstrated an understanding of the instructions.  I provided 20+ minutes of  non-face-to-face time during this encounter.  Danelle Berry, PA-C 07/23/22 10:58 AM

## 2022-07-27 ENCOUNTER — Telehealth: Payer: Self-pay | Admitting: Pulmonary Disease

## 2022-07-27 DIAGNOSIS — G4733 Obstructive sleep apnea (adult) (pediatric): Secondary | ICD-10-CM

## 2022-07-27 NOTE — Telephone Encounter (Signed)
Spoke to patient. She is concerned that cpap settings are not in line with Dr. Evlyn Courier recommendations. She is concerned that her machine is causing sickness. Roughly one month, she was dx with bronchitis and last week she developed sore throat and voice hoariness. This morning her throat was itchy and sore again.  Negative covid test last week.  Denied f/c/s or additional sx.   Download available in react.    Dr. Craige Cotta, please advise. Thanks

## 2022-07-27 NOTE — Telephone Encounter (Signed)
Download reviewed.  Please have her auto CPAP setting changed to 5 to 8 cm H2O.

## 2022-07-27 NOTE — Telephone Encounter (Signed)
PT states her CPAP settings may not be in line with Dr. Evlyn Courier recommendation and wanted to speak to someone about this. It is causing some major health issues with her heath she said.  Please call to advise @ [phone]

## 2022-07-27 NOTE — Telephone Encounter (Signed)
Patient is aware of recommendations and voiced her understanding.  Order placed to change settings. Nothing further needed.

## 2022-08-01 ENCOUNTER — Encounter: Payer: Self-pay | Admitting: Family Medicine

## 2022-08-23 ENCOUNTER — Other Ambulatory Visit: Payer: Self-pay | Admitting: Family Medicine

## 2022-09-01 ENCOUNTER — Other Ambulatory Visit: Payer: Self-pay | Admitting: Nurse Practitioner

## 2022-09-01 ENCOUNTER — Other Ambulatory Visit: Payer: Self-pay

## 2022-09-01 ENCOUNTER — Encounter: Payer: Self-pay | Admitting: Nurse Practitioner

## 2022-09-01 ENCOUNTER — Ambulatory Visit (INDEPENDENT_AMBULATORY_CARE_PROVIDER_SITE_OTHER): Payer: Medicare Other | Admitting: Nurse Practitioner

## 2022-09-01 VITALS — BP 128/76 | HR 79 | Temp 98.2°F | Resp 16 | Ht 66.0 in | Wt 223.9 lb

## 2022-09-01 DIAGNOSIS — Z6836 Body mass index (BMI) 36.0-36.9, adult: Secondary | ICD-10-CM

## 2022-09-01 DIAGNOSIS — M25552 Pain in left hip: Secondary | ICD-10-CM

## 2022-09-01 MED ORDER — MELOXICAM 15 MG PO TABS
7.5000 mg | ORAL_TABLET | Freq: Every day | ORAL | 1 refills | Status: DC
Start: 2022-09-01 — End: 2023-10-13

## 2022-09-01 MED ORDER — WEGOVY 0.25 MG/0.5ML ~~LOC~~ SOAJ
0.2500 mg | SUBCUTANEOUS | 0 refills | Status: DC
Start: 2022-09-01 — End: 2022-09-01

## 2022-09-01 MED ORDER — OZEMPIC (0.25 OR 0.5 MG/DOSE) 2 MG/3ML ~~LOC~~ SOPN
0.2500 mg | PEN_INJECTOR | SUBCUTANEOUS | 0 refills | Status: DC
Start: 2022-09-01 — End: 2022-12-15

## 2022-09-01 NOTE — Progress Notes (Signed)
BP 128/76   Pulse 79   Temp 98.2 F (36.8 C) (Oral)   Resp 16   Ht 5\' 6"  (1.676 m)   Wt 223 lb 14.4 oz (101.6 kg)   LMP  (LMP Unknown) Comment: rt ovary removed  SpO2 99%   BMI 36.14 kg/m    Subjective:    Patient ID: Allison Phillips, female    DOB: 1950/03/24, 72 y.o.   MRN: [id number]  HPI: Allison Phillips is a 72 y.o. female  Chief Complaint  Patient presents with   Hip Pain    Left hip pain   Left hip pain:patient reports she has had left hip pain for a long time now. She says recently it flared up more. She says she has been trying to walk a mile everyday and then yesterday it was just really painful. She did take a meloxicam yesterday and went to bed.   previously saw ortho and received steroid injection.  Last seen by ortho on 12/08/2021.  Recommend she reach out to her orthopedic again.    Obesity:  Current weight : 223 lbs BMI: 36.14 Highest weight:current Treatment Tried: life style modification Comorbidities: osa, emphysema, htn, aortic atherosclerosis  She would like to try and get approved for zepbound or wegovy.   She denies any history of thyroid cancer,  no personal history of pancreatitis.     Relevant past medical, surgical, family and social history reviewed and updated as indicated. Interim medical history since our last visit reviewed. Allergies and medications reviewed and updated.  Review of Systems  Constitutional: Negative for fever or weight change.  Respiratory: Negative for cough and shortness of breath.   Cardiovascular: Negative for chest pain or palpitations.  Gastrointestinal: Negative for abdominal pain, no bowel changes.  Musculoskeletal: Negative for gait problem or joint swelling. Positive for left hip pain Skin: Negative for rash.  Neurological: Negative for dizziness or headache.  No other specific complaints in a complete review of systems (except as listed in HPI above).      Objective:    BP 128/76   Pulse 79   Temp  98.2 F (36.8 C) (Oral)   Resp 16   Ht 5\' 6"  (1.676 m)   Wt 223 lb 14.4 oz (101.6 kg)   LMP  (LMP Unknown) Comment: rt ovary removed  SpO2 99%   BMI 36.14 kg/m   Wt Readings from Last 3 Encounters:  09/01/22 223 lb 14.4 oz (101.6 kg)  06/02/22 221 lb (100.2 kg)  05/26/22 221 lb (100.2 kg)    Physical Exam  Constitutional: Patient appears well-developed and well-nourished. Obese  No distress.  HEENT: head atraumatic, normocephalic, pupils equal and reactive to light, neck supple, throat within normal limits Cardiovascular: Normal rate, regular rhythm and normal heart sounds.  No murmur heard. No BLE edema. Pulmonary/Chest: Effort normal and breath sounds normal. No respiratory distress. Abdominal: Soft.  There is no tenderness. MSK:  tenderness in the left hip Psychiatric: Patient has a normal mood and affect. behavior is normal. Judgment and thought content normal.     Assessment & Plan:   Problem List Items Addressed This Visit   None Visit Diagnoses     Left hip pain    -  Primary   refilled meloxicam and referral placed to orthopedic   Relevant Medications   meloxicam (MOBIC) 15 MG tablet   Other Relevant Orders   Ambulatory referral to Orthopedic Surgery   Class 2 severe obesity due to excess  calories with serious comorbidity and body mass index (BMI) of 36.0 to 36.9 in adult [facility])       would like to try and get approved for weight loss medicaiton, prescription sent in for semaglutide.   Relevant Medications   Semaglutide-Weight Management (WEGOVY) 0.25 MG/0.5ML SOAJ        Follow up plan: Return in about 3 months (around 12/02/2022) for follow up, with Leisa regarding weight loss.

## 2022-09-09 ENCOUNTER — Ambulatory Visit (INDEPENDENT_AMBULATORY_CARE_PROVIDER_SITE_OTHER): Payer: Medicare Other

## 2022-09-09 VITALS — Ht 65.0 in | Wt 223.0 lb

## 2022-09-09 DIAGNOSIS — Z0000 Encounter for general adult medical examination without abnormal findings: Secondary | ICD-10-CM

## 2022-09-09 NOTE — Patient Instructions (Signed)
Allison Phillips , Thank you for taking time to come for your Medicare Wellness Visit. I appreciate your ongoing commitment to your health goals. Please review the following plan we discussed and let me know if I can assist you in the future.   Referrals/Orders/Follow-Ups/Clinician Recommendations: none  This is a list of the screening recommended for you and due dates:  Health Maintenance  Topic Date Due   COVID-19 Vaccine (7 - 2023-24 season) 12/21/2021   Flu Shot  08/19/2022   DEXA scan (bone density measurement)  10/24/2022   Colon Cancer Screening  02/18/2023   Mammogram  04/02/2023   Medicare Annual Wellness Visit  09/09/2023   Pneumonia Vaccine  Completed   Hepatitis C Screening  Completed   Zoster (Shingles) Vaccine  Completed   HPV Vaccine  Aged Out   DTaP/Tdap/Td vaccine  Discontinued    Advanced directives: (Copy Requested) Please bring a copy of your health care power of attorney and living will to the office to be added to your chart at your convenience.  Next Medicare Annual Wellness Visit scheduled for next year: Yes 09/15/23 @ 10:45am telephone

## 2022-09-09 NOTE — Progress Notes (Signed)
Subjective:   Allison Phillips is a 72 y.o. female who presents for Medicare Annual (Subsequent) preventive examination.  Visit Complete: Virtual  I connected with  [name] on 09/09/22 by a audio enabled telemedicine application and verified that I am speaking with the correct person using two identifiers.  Patient Location: Home  Provider Location: Office/Clinic  I discussed the limitations of evaluation and management by telemedicine. The patient expressed understanding and agreed to proceed.  Vital Signs: Unable to obtain new vitals due to this being a telehealth visit.  Patient Medicare AWV questionnaire was completed by the patient on (not done); I have confirmed that all information answered by patient is correct and no changes since this date.  Review of Systems    Cardiac Risk Factors include: advanced age (>40men, >88 women);hypertension;obesity (BMI >30kg/m2);sedentary lifestyle;smoking/ tobacco exposure    Objective:    Today's Vitals   09/09/22 0951  Weight: 223 lb (101.2 kg)  Height: 5\' 5"  (1.651 m)   Body mass index is 37.11 kg/m.     09/09/2022   10:00 AM 07/17/2020    9:25 AM 07/17/2019    9:18 AM 01/18/2019    6:46 PM 01/18/2019   10:46 AM 01/08/2019    8:57 AM 07/13/2018    8:50 AM  Advanced Directives  Does Patient Have a Medical Advance Directive? Yes No No No No No No  Type of Estate agent of [place];Living will        Would patient like information on creating a medical advance directive?  No - Patient declined No - Patient declined No - Patient declined No - Patient declined No - Patient declined Yes (MAU/Ambulatory/Procedural Areas - Information given)    Current Medications (verified) Outpatient Encounter Medications as of 09/09/2022  Medication Sig   albuterol (VENTOLIN HFA) 108 (90 Base) MCG/ACT inhaler Inhale 2 puffs into the lungs every 4 (four) hours as needed for wheezing or shortness of breath.   aspirin EC  81 MG tablet Take 1 tablet (81 mg total) by mouth daily. Swallow whole.   Cyanocobalamin (B-12) 500 MCG TABS Take 2,500 mcg by mouth daily.    Gluc-Chonn-MSM-Boswellia-Vit D (GLUCOSAMINE CHONDROITIN + D3 PO) Take by mouth.   meloxicam (MOBIC) 15 MG tablet Take 0.5-1 tablets (7.5-15 mg total) by mouth daily.   Multiple Vitamin (MULTIVITAMIN) tablet Take 1 tablet by mouth daily. Centrum Silver   Omega-3 Fatty Acids (FISH OIL PO) Take 1,200 mg daily by mouth.    pantoprazole (PROTONIX) 40 MG tablet TAKE 1 TABLET(40 MG) BY MOUTH EVERY MORNING 1 HOUR BEFORE BREAKFAST   rosuvastatin (CRESTOR) 10 MG tablet TAKE 1 TABLET(10 MG) BY MOUTH DAILY   Semaglutide,0.25 or 0.5MG /DOS, (OZEMPIC, 0.25 OR 0.5 MG/DOSE,) 2 MG/3ML SOPN Inject 0.25 mg into the skin once a week.   benzonatate (TESSALON) 100 MG capsule Take 1 capsule (100 mg total) by mouth 3 (three) times daily as needed for cough. (Patient not taking: Reported on 07/23/2022)   diclofenac Sodium (VOLTAREN) 1 % GEL Apply 2 g topically 4 (four) times daily as needed (for chronic pain management). (Patient not taking: Reported on 07/23/2022)   metoprolol tartrate (LOPRESSOR) 50 MG tablet Take tablet (50mg ) TWO hours prior to your cardiac CT scan. (Patient not taking: Reported on 07/23/2022)   triamcinolone (KENALOG) 0.025 % ointment Apply 1 Application topically 2 (two) times daily as needed. (Patient not taking: Reported on 07/23/2022)   valsartan (DIOVAN) 80 MG tablet TAKE 1 TABLET(80 MG) BY  MOUTH DAILY (Patient not taking: Reported on 07/23/2022)   No facility-administered encounter medications on file as of 09/09/2022.    Allergies (verified) Adhesive [tape]   History: Past Medical History:  Diagnosis Date   Bursitis of left hip    Depression    Depression    Diverticulitis    Emphysema lung (HCC)    Hepatic abscess 01/18/2019   Hip pain, chronic, left 03/18/2017   Hypertension    Joint pain    Lower back pain 03/18/2017   Obesity    OSA (obstructive  sleep apnea)    Osteopenia 04/20/2017   April 2019; next DEXA April 2021   Osteoporosis    Prediabetes    Reflux    Sacroiliac joint pain 03/18/2017   Tobacco use    Past Surgical History:  Procedure Laterality Date   ANAL FISSURE REPAIR     APPENDECTOMY     CESAREAN SECTION  1978   COLONOSCOPY WITH PROPOFOL N/A 12/18/2014   Procedure: COLONOSCOPY WITH PROPOFOL;  Surgeon: Kieth Brightly, MD;  Location: [facility] ENDOSCOPY;  Service: Endoscopy;  Laterality: N/A;   COLONOSCOPY WITH PROPOFOL N/A 02/17/2018   Procedure: COLONOSCOPY WITH PROPOFOL;  Surgeon: Pasty Spillers, MD;  Location: [facility] ENDOSCOPY;  Service: Endoscopy;  Laterality: N/A;   LIPOMA EXCISION  07/2015   right shoulder   LIPOMA EXCISION  11/19/2015   OOPHORECTOMY     for "fibroids" not sure which ovary   Family History  Problem Relation Age of Onset   Hypertension Mother    Kidney disease Mother    Alzheimer's disease Mother    Thyroid cancer Son    Lung cancer Brother    Rectal cancer Brother    Breast cancer Neg Hx    Social History   Socioeconomic History   Marital status: Married    Spouse name: Dimas Aguas   Number of children: 1   Years of education: Not on file   Highest education level: Bachelor's degree (e.g., BA, AB, BS)  Occupational History   Occupation: Retired  Tobacco Use   Smoking status: Every Day    Current packs/day: 0.00    Average packs/day: 0.5 packs/day for 15.0 years (7.5 ttl pk-yrs)    Types: Cigarettes    Start date: 10/24/2001    Last attempt to quit: 10/24/2016    Years since quitting: 5.8   Smokeless tobacco: Never   Tobacco comments:    8-10 cigarettes daily- 06/09/2021  Vaping Use   Vaping status: Never Used  Substance and Sexual Activity   Alcohol use: Yes    Alcohol/week: 4.0 standard drinks of alcohol    Types: 4 Standard drinks or equivalent per week    Comment: on occasion   Drug use: No   Sexual activity: Yes    Partners: Male  Other Topics Concern   Not  on file  Social History Narrative   Not on file   Social Determinants of Health   Financial Resource Strain: Low Risk  (09/09/2022)   Overall Financial Resource Strain (CARDIA)    Difficulty of Paying Living Expenses: Not hard at all  Food Insecurity: No Food Insecurity (09/09/2022)   Hunger Vital Sign    Worried About Running Out of Food in the Last Year: Never true    Ran Out of Food in the Last Year: Never true  Transportation Needs: No Transportation Needs (09/09/2022)   PRAPARE - Administrator, Civil Service (Medical): No    Lack of Transportation (  Non-Medical): No  Physical Activity: Sufficiently Active (09/09/2022)   Exercise Vital Sign    Days of Exercise per Week: 3 days    Minutes of Exercise per Session: 50 min  Stress: No Stress Concern Present (09/09/2022)   [employer] of Occupational Health - Occupational Stress Questionnaire    Feeling of Stress : Not at all  Social Connections: Socially Integrated (09/09/2022)   Social Connection and Isolation Panel [NHANES]    Frequency of Communication with Friends and Family: More than three times a week    Frequency of Social Gatherings with Friends and Family: Once a week    Attends Religious Services: More than 4 times per year    Active Member of [employer] or Organizations: Yes    Attends Engineer, structural: More than 4 times per year    Marital Status: Married    Tobacco Counseling Ready to quit: Not Answered Counseling given: Not Answered Tobacco comments: 8-10 cigarettes daily- 06/09/2021   Clinical Intake:  Pre-visit preparation completed: Yes  Pain : No/denies pain    BMI - recorded: 37.11 Nutritional Status: BMI > 30  Obese Nutritional Risks: None Diabetes: No  How often do you need to have someone help you when you read instructions, pamphlets, or other written materials from your doctor or pharmacy?: 1 - Never  Interpreter Needed?: No  Comments: lives with husband Information  entered by :: B.Neziah Braley,LPN   Activities of Daily Living    09/09/2022   10:00 AM 09/01/2022   11:44 AM  In your present state of health, do you have any difficulty performing the following activities:  Hearing? 0 0  Vision? 0 0  Difficulty concentrating or making decisions? 0 0  Walking or climbing stairs? 1 1  Dressing or bathing? 0 0  Doing errands, shopping? 0 0  Preparing Food and eating ? N   Using the Toilet? N   In the past six months, have you accidently leaked urine? N   Do you have problems with loss of bowel control? N   Managing your Medications? N   Managing your Finances? N   Housekeeping or managing your Housekeeping? N     Patient Care Team: Danelle Berry, PA-C as PCP - General (Family Medicine) Kennedy Bucker, MD as Consulting Physician (Orthopedic Surgery) Ronnette Juniper as Physician Assistant (Orthopedic Surgery)  Indicate any recent Medical Services you may have received from other than [facility] providers in the past year (date may be approximate).     Assessment:   This is a routine wellness examination for Ardele.  Hearing/Vision screen Hearing Screening - Comments:: Adequate hearing Vision Screening - Comments:: Adequate vision w/glasses [facility]  Dietary issues and exercise activities discussed:     Goals Addressed             This Visit's Progress    DIET - INCREASE WATER INTAKE   On track    Recommend drinking 6-8 glasses of water per day     Quit smoking / using tobacco   Not on track      Depression Screen    09/09/2022    9:58 AM 09/01/2022   11:44 AM 07/23/2022    9:51 AM 05/26/2022    9:44 AM 04/14/2022   10:52 AM 03/31/2022   10:52 AM 12/09/2021    9:15 AM  PHQ 2/9 Scores  PHQ - 2 Score 0 0 0 0 1 1 0  PHQ- 9 Score   0 0 2 2  0    Fall Risk    09/09/2022    9:54 AM 09/01/2022   11:44 AM 07/23/2022    9:51 AM 05/26/2022    9:44 AM 04/14/2022   10:52 AM  Fall Risk   Falls in the past year? 0 0 0 0 0  Number falls in past  yr: 0 0 0 0   Injury with Fall? 0 0 0 0   Risk for fall due to : No Fall Risks  No Fall Risks No Fall Risks No Fall Risks  Follow up Education provided;Falls prevention discussed  Falls prevention discussed;Education provided;Falls evaluation completed Falls prevention discussed;Education provided;Falls evaluation completed Falls prevention discussed    MEDICARE RISK AT HOME: Medicare Risk at Home Any stairs in or around the home?: No If so, are there any without handrails?: No Home free of loose throw rugs in walkways, pet beds, electrical cords, etc?: Yes Adequate lighting in your home to reduce risk of falls?: Yes Life alert?: No Use of a cane, walker or w/c?: No Grab bars in the bathroom?: Yes Shower chair or bench in shower?: No Elevated toilet seat or a handicapped toilet?: Yes  TIMED UP AND GO:  Was the test performed?  No    Cognitive Function:        09/09/2022   10:03 AM 07/29/2021    8:26 AM 12/06/2016    3:49 PM  6CIT Screen  What Year? 0 points 0 points 0 points  What month? 0 points 0 points 0 points  What time? 0 points 0 points 0 points  Count back from 20 0 points 0 points 0 points  Months in reverse 0 points 0 points 0 points  Repeat phrase 0 points 0 points 0 points  Total Score 0 points 0 points 0 points    Immunizations Immunization History  Administered Date(s) Administered   Fluad Quad(high Dose 65+) 10/19/2018, 10/08/2019, 10/08/2020   Influenza, High Dose Seasonal PF 11/14/2017, 10/15/2021   Moderna Covid-19 Vaccine Bivalent Booster 71yrs & up 10/27/2020   Moderna SARS-COV2 Booster Vaccination 10/26/2021   Moderna Sars-Covid-2 Vaccination 03/02/2019, 04/02/2019, 11/13/2019, 04/28/2020   Pneumococcal Conjugate-13 12/06/2016   Pneumococcal Polysaccharide-23 11/02/2010, 08/28/2018, 01/19/2019   RSV,unspecified 12/21/2021   Td 10/23/2007   Zoster Recombinant(Shingrix) 10/15/2021, 12/21/2021   Zoster, Live 11/16/2011    TDAP status: Up to  date  Flu Vaccine status: Up to date  Pneumococcal vaccine status: Up to date  Covid-19 vaccine status: Completed vaccines  Qualifies for Shingles Vaccine? Yes   Zostavax completed Yes   Shingrix Completed?: Yes  Screening Tests Health Maintenance  Topic Date Due   COVID-19 Vaccine (7 - 2023-24 season) 12/21/2021   INFLUENZA VACCINE  08/19/2022   DEXA SCAN  10/24/2022   Colonoscopy  02/18/2023   MAMMOGRAM  04/02/2023   Medicare Annual Wellness (AWV)  09/09/2023   Pneumonia Vaccine 23+ Years old  Completed   Hepatitis C Screening  Completed   Zoster Vaccines- Shingrix  Completed   HPV VACCINES  Aged Out   DTaP/Tdap/Td  Discontinued    Health Maintenance  Health Maintenance Due  Topic Date Due   COVID-19 Vaccine (7 - 2023-24 season) 12/21/2021   INFLUENZA VACCINE  08/19/2022    Colorectal cancer screening: Type of screening: Colonoscopy. Completed yes. Repeat every 5-10 years  Mammogram status: Completed yes. Repeat every year  Bone Density status: Completed 5. Results reflect: Bone density results: OSTEOPENIA. Repeat every 3 years.  Lung Cancer Screening: (Low Dose CT Chest  recommended if Age 47-80 years, 20 pack-year currently smoking OR have quit w/in 15years.) does qualify.   Lung Cancer Screening Referral: no has order in place  Additional Screening:  Hepatitis C Screening: does not qualify; Completed yes  Vision Screening: Recommended annual ophthalmology exams for early detection of glaucoma and other disorders of the eye. Is the patient up to date with their annual eye exam?  Yes  Who is the provider or what is the name of the office in which the patient attends annual eye exams? [facility] If pt is not established with a provider, would they like to be referred to a provider to establish care? No .   Dental Screening: Recommended annual dental exams for proper oral hygiene  Diabetic Foot Exam: n/a  Community Resource Referral / Chronic Care  Management: CRR required this visit?  No   CCM required this visit?  No    Plan:     I have personally reviewed and noted the following in the patient's chart:   Medical and social history Use of alcohol, tobacco or illicit drugs  Current medications and supplements including opioid prescriptions. Patient is not currently taking opioid prescriptions. Functional ability and status Nutritional status Physical activity Advanced directives List of other physicians Hospitalizations, surgeries, and ER visits in previous 12 months Vitals Screenings to include cognitive, depression, and falls Referrals and appointments  In addition, I have reviewed and discussed with patient certain preventive protocols, quality metrics, and best practice recommendations. A written personalized care plan for preventive services as well as general preventive health recommendations were provided to patient.    Sue Lush, LPN   4/74/2595   After Visit Summary: (MyChart) Due to this being a telephonic visit, the after visit summary with patients personalized plan was offered to patient via MyChart   Nurse Notes: the patient is stating she is having lots of hip pain that is limiting her activities and walking.Pt says she has an appt with Ortho at [facility] this week.

## 2022-10-11 ENCOUNTER — Other Ambulatory Visit: Payer: Self-pay | Admitting: Family Medicine

## 2022-10-11 DIAGNOSIS — I700 Atherosclerosis of aorta: Secondary | ICD-10-CM

## 2022-10-15 ENCOUNTER — Ambulatory Visit: Payer: Medicare Other | Admitting: Family Medicine

## 2022-10-25 ENCOUNTER — Ambulatory Visit (INDEPENDENT_AMBULATORY_CARE_PROVIDER_SITE_OTHER): Payer: Medicare Other | Admitting: Internal Medicine

## 2022-10-25 ENCOUNTER — Encounter: Payer: Self-pay | Admitting: Internal Medicine

## 2022-10-25 VITALS — BP 122/68 | HR 72 | Temp 98.3°F | Resp 18 | Ht 66.0 in | Wt 222.4 lb

## 2022-10-25 DIAGNOSIS — E2839 Other primary ovarian failure: Secondary | ICD-10-CM | POA: Diagnosis not present

## 2022-10-25 DIAGNOSIS — Z13820 Encounter for screening for osteoporosis: Secondary | ICD-10-CM

## 2022-10-25 DIAGNOSIS — R7303 Prediabetes: Secondary | ICD-10-CM

## 2022-10-25 DIAGNOSIS — R1012 Left upper quadrant pain: Secondary | ICD-10-CM

## 2022-10-25 DIAGNOSIS — Z122 Encounter for screening for malignant neoplasm of respiratory organs: Secondary | ICD-10-CM

## 2022-10-25 NOTE — Progress Notes (Signed)
Acute Office Visit  Subjective:     Patient ID: Allison Phillips, female    DOB: 06-May-1950, 72 y.o.   MRN: [id number]  Chief Complaint  Patient presents with   Abdominal Pain    LUQ comes and goes    HPI Patient is in today for abdominal pain.   Abdominal Pain: -Duration:  about 1 month -Frequency: a few times a week, becoming more frequent  -Nature: aching and sore -Location: LUQ  -Radiation: no -Alleviating factors: Nothing  -Aggravating factors: Nothing in paricular that triggers it  -Treatments attempted: none -Constipation: intermittent -Diarrhea: no -Heartburn: no -Bloating:no - but feels more gassy and will burp with the symptoms -Nausea: no -Vomiting: no -Melena or hematochezia: no -Weight loss: no -Change in Appetite: no   Review of Systems  Constitutional:  Negative for chills and fever.  Gastrointestinal:  Positive for abdominal pain. Negative for blood in stool, constipation, diarrhea, heartburn, nausea and vomiting.  Genitourinary:  Negative for dysuria, frequency, hematuria and urgency.        Objective:    BP 122/68   Pulse 72   Temp 98.3 F (36.8 C)   Resp 18   Ht 5\' 6"  (1.676 m)   Wt 222 lb 6.4 oz (100.9 kg)   LMP  (LMP Unknown) Comment: rt ovary removed  SpO2 97%   BMI 35.90 kg/m  BP Readings from Last 3 Encounters:  10/25/22 122/68  09/01/22 128/76  06/02/22 138/84   Wt Readings from Last 3 Encounters:  10/25/22 222 lb 6.4 oz (100.9 kg)  09/09/22 223 lb (101.2 kg)  09/01/22 223 lb 14.4 oz (101.6 kg)      Physical Exam Constitutional:      Appearance: Normal appearance.  HENT:     Head: Normocephalic and atraumatic.  Eyes:     Conjunctiva/sclera: Conjunctivae normal.  Cardiovascular:     Rate and Rhythm: Normal rate and regular rhythm.  Pulmonary:     Effort: Pulmonary effort is normal.     Breath sounds: Normal breath sounds.  Abdominal:     General: Bowel sounds are normal. There is no distension.      Palpations: Abdomen is soft. There is no mass.     Tenderness: There is abdominal tenderness. There is no guarding or rebound.     Hernia: No hernia is present.     Comments: LUQ pain to palpation   Skin:    General: Skin is warm and dry.  Neurological:     General: No focal deficit present.     Mental Status: She is alert. Mental status is at baseline.  Psychiatric:        Mood and Affect: Mood normal.        Behavior: Behavior normal.     No results found for any visits on 10/25/22.      Assessment & Plan:   1. LUQ pain: Patient has a history of liver abscess, which is why she was concerned about her symptoms now. Pain is in the LUQ, non-radiating. Abdominal exam benign. Will check WBC, liver and lipase today. Discussed how symptoms are consistent with GERD, she was prescribed Pantoprazole previously but she does not take it. Recommend taking Tums or OTC Pepcid as needed for symptoms and will return if symptoms worsen or fail to improve.   - Lipase - COMPLETE METABOLIC PANEL WITH GFR - CBC w/Diff/Platelet  2. Prediabetes: Recheck A1c today.   - HgB A1c  3. Screening for osteoporosis/Estrogen deficiency: Screening  due.  - DG Bone Density; Future  4.Screening for lung cancer: Referral placed.   - Ambulatory Referral Lung Cancer Screening [facility]   Return if symptoms worsen or fail to improve.  Margarita Mail, DO

## 2022-10-26 LAB — CBC WITH DIFFERENTIAL/PLATELET
Absolute Monocytes: 510 {cells}/uL (ref 200–950)
Basophils Absolute: 90 {cells}/uL (ref 0–200)
Basophils Relative: 1.2 %
Eosinophils Absolute: 180 {cells}/uL (ref 15–500)
Eosinophils Relative: 2.4 %
HCT: 42.8 % (ref 35.0–45.0)
Hemoglobin: 13.7 g/dL (ref 11.7–15.5)
Lymphs Abs: 3053 {cells}/uL (ref 850–3900)
MCH: 28 pg (ref 27.0–33.0)
MCHC: 32 g/dL (ref 32.0–36.0)
MCV: 87.3 fL (ref 80.0–100.0)
MPV: 11.1 fL (ref 7.5–12.5)
Monocytes Relative: 6.8 %
Neutro Abs: 3668 {cells}/uL (ref 1500–7800)
Neutrophils Relative %: 48.9 %
Platelets: 355 10*3/uL (ref 140–400)
RBC: 4.9 10*6/uL (ref 3.80–5.10)
RDW: 13.1 % (ref 11.0–15.0)
Total Lymphocyte: 40.7 %
WBC: 7.5 10*3/uL (ref 3.8–10.8)

## 2022-10-26 LAB — COMPLETE METABOLIC PANEL WITH GFR
AG Ratio: 1.4 (calc) (ref 1.0–2.5)
ALT: 10 U/L (ref 6–29)
AST: 13 U/L (ref 10–35)
Albumin: 4.2 g/dL (ref 3.6–5.1)
Alkaline phosphatase (APISO): 53 U/L (ref 37–153)
BUN: 15 mg/dL (ref 7–25)
CO2: 29 mmol/L (ref 20–32)
Calcium: 9.9 mg/dL (ref 8.6–10.4)
Chloride: 103 mmol/L (ref 98–110)
Creat: 0.94 mg/dL (ref 0.60–1.00)
Globulin: 2.9 g/dL (ref 1.9–3.7)
Glucose, Bld: 96 mg/dL (ref 65–99)
Potassium: 5 mmol/L (ref 3.5–5.3)
Sodium: 141 mmol/L (ref 135–146)
Total Bilirubin: 0.3 mg/dL (ref 0.2–1.2)
Total Protein: 7.1 g/dL (ref 6.1–8.1)
eGFR: 65 mL/min/{1.73_m2} (ref >=60)

## 2022-10-26 LAB — HEMOGLOBIN A1C
Hgb A1c MFr Bld: 6.1 %{Hb} — ABNORMAL HIGH (ref <5.7)
Mean Plasma Glucose: 128 mg/dL
eAG (mmol/L): 7.1 mmol/L

## 2022-10-26 LAB — LIPASE: Lipase: 24 U/L (ref 7–60)

## 2022-11-01 ENCOUNTER — Encounter: Payer: Self-pay | Admitting: Physician Assistant

## 2022-11-01 ENCOUNTER — Ambulatory Visit: Payer: Medicare Other | Admitting: Physician Assistant

## 2022-11-01 VITALS — BP 136/80 | HR 74 | Temp 97.5°F | Resp 16 | Ht 66.0 in | Wt 222.4 lb

## 2022-11-01 DIAGNOSIS — E669 Obesity, unspecified: Secondary | ICD-10-CM

## 2022-11-01 DIAGNOSIS — I700 Atherosclerosis of aorta: Secondary | ICD-10-CM | POA: Diagnosis not present

## 2022-11-01 DIAGNOSIS — I10 Essential (primary) hypertension: Secondary | ICD-10-CM

## 2022-11-01 NOTE — Assessment & Plan Note (Signed)
Chronic, historic condition Patient reports she has not been taking valsartan as directed.  She states that she never started it She reports home BP measures and 140s/ 80s and BP today is above goal Recommend that she starts the valsartan 80 mg p.o. daily and checks blood pressure at home, keep a log to review at follow-up Recommend that she continue with diet and exercise Follow-up in 3 months or sooner if concerns arise

## 2022-11-01 NOTE — Assessment & Plan Note (Signed)
Chronic, historic condition Patient is currently taking semaglutide 0.25 mg weekly injection and appears to be tolerating well so far Today we reviewed administration, dosing, most common side effects.  Allowed patient to ask questions until she voiced satisfaction Recommend she continues with current regimen. Follow-up in 3 months or sooner if concerns arise

## 2022-11-01 NOTE — Assessment & Plan Note (Signed)
Chronic, historic condition Patient is currently taking rosuvastatin 10 mg p.o. daily along with daily fish oil Continue current management Recheck lipid panel at follow-up Follow-up in 3 months or sooner if concerns arise

## 2022-11-01 NOTE — Progress Notes (Signed)
Established Patient Office Visit  Name: Allison Phillips   MRN: [id number]    DOB: 1950/04/14   Date:11/01/2022  Today's Provider: Jacquelin Hawking, MHS, PA-C Introduced myself to the patient as a PA-C and provided education on APPs in clinical practice.         Subjective  Chief Complaint  Chief Complaint  Patient presents with   Medical Management of Chronic Issues    HPI    HYPERTENSION / HYPERLIPIDEMIA Satisfied with current treatment? yes Duration of hypertension: years BP monitoring frequency: a few times a month BP range: 140/80s  BP medication side effects: no Past BP meds:Valsartan - she has not been taking this. Reviewed importance of starting this for bp control  Duration of hyperlipidemia: years Cholesterol medication side effects: no Cholesterol supplements: fish oil Past cholesterol medications: rosuvastatin (crestor) Medication compliance: good compliance Aspirin: yes Recent stressors: no Recurrent headaches: no Visual changes: yes- reports eyesight is getting worse and she has been getting flashes. Her vision doctor and PCP stated it was migraines  Palpitations: no Dyspnea: no Chest pain: no Lower extremity edema: no Dizzy/lightheaded: no    Obesity/ prediabetes   She has just started Ozempic 0.25 mg - reviewed dosing, administration, most common side effects     Patient Active Problem List   Diagnosis Date Noted   Centrilobular emphysema (HCC) 04/14/2022   OSA (obstructive sleep apnea) 09/23/2020   Aortic atherosclerosis (HCC) 11/14/2019   Obesity (BMI 35.0-39.9 without comorbidity) 01/06/2018   Osteopenia 04/20/2017   Morbid obesity (HCC) 12/30/2016   Essential hypertension, benign 03/08/2016   Tobacco abuse     Past Surgical History:  Procedure Laterality Date   ANAL FISSURE REPAIR     APPENDECTOMY     CESAREAN SECTION  1978   COLONOSCOPY WITH PROPOFOL N/A 12/18/2014   Procedure: COLONOSCOPY WITH PROPOFOL;  Surgeon:  Kieth Brightly, MD;  Location: [facility];  Service: Endoscopy;  Laterality: N/A;   COLONOSCOPY WITH PROPOFOL N/A 02/17/2018   Procedure: COLONOSCOPY WITH PROPOFOL;  Surgeon: Pasty Spillers, MD;  Location: [facility] END[facility]COPY;  Service: Endoscopy;  Laterality: N/A;   LIPOMA EXCISION  07/2015   right shoulder   LIPOMA EXCISION  11/19/2015   OOPHORECTOMY     for "fibroids" not sure which ovary    Family History  Problem Relation Age of Onset   Hypertension Mother    Kidney disease Mother    Alzheimer's disease Mother    Thyroid cancer Son    Lung cancer Brother    Rectal cancer Brother    Breast cancer Neg Hx     Social History   Tobacco Use   Smoking status: Every Day    Current packs/day: 0.00    Average packs/day: 0.5 packs/day for 15.0 years (7.5 ttl pk-yrs)    Types: Cigarettes    Start date: 10/24/2001    Last attempt to quit: 10/24/2016    Years since quitting: 6.0   Smokeless tobacco: Never   Tobacco comments:    8-10 cigarettes daily- 06/09/2021  Substance Use Topics   Alcohol use: Yes    Alcohol/week: 4.0 standard drinks of alcohol    Types: 4 Standard drinks or equivalent per week    Comment: on occasion     Current Outpatient Medications:    albuterol (VENTOLIN HFA) 108 (90 Base) MCG/ACT inhaler, Inhale 2 puffs into the lungs every 4 (four) hours as needed for wheezing or shortness of breath., Disp:  1 each, Rfl: 4   aspirin EC 81 MG tablet, Take 1 tablet (81 mg total) by mouth daily. Swallow whole., Disp: 100 tablet, Rfl: 1   Cyanocobalamin (B-12) 500 MCG TABS, Take 2,500 mcg by mouth daily. , Disp: , Rfl:    Gluc-Chonn-MSM-Boswellia-Vit D (GLUCOSAMINE CHONDROITIN + D3 PO), Take by mouth., Disp: , Rfl:    meloxicam (MOBIC) 15 MG tablet, Take 0.5-1 tablets (7.5-15 mg total) by mouth daily., Disp: 90 tablet, Rfl: 1   Multiple Vitamin (MULTIVITAMIN) tablet, Take 1 tablet by mouth daily. Centrum Silver, Disp: , Rfl:    Omega-3 Fatty Acids (FISH OIL PO),  Take 1,200 mg daily by mouth. , Disp: , Rfl:    pantoprazole (PROTONIX) 40 MG tablet, TAKE 1 TABLET(40 MG) BY MOUTH EVERY MORNING 1 HOUR BEFORE BREAKFAST, Disp: 90 tablet, Rfl: 0   rosuvastatin (CRESTOR) 10 MG tablet, TAKE 1 TABLET(10 MG) BY MOUTH DAILY, Disp: 90 tablet, Rfl: 0   Semaglutide,0.25 or 0.5MG /DOS, (OZEMPIC, 0.25 OR 0.5 MG/DOSE,) 2 MG/3ML SOPN, Inject 0.25 mg into the skin once a week., Disp: 3 mL, Rfl: 0   triamcinolone (KENALOG) 0.025 % ointment, Apply 1 Application topically 2 (two) times daily as needed., Disp: 30 g, Rfl: 0   valsartan (DIOVAN) 80 MG tablet, TAKE 1 TABLET(80 MG) BY MOUTH DAILY, Disp: 90 tablet, Rfl: 0  Allergies  Allergen Reactions   Adhesive [Tape]     Rash/dermatitis    I personally reviewed active problem list, medication list, allergies, health maintenance, notes from last encounter, lab results with the patient/caregiver today.   ROS  See HPI for relevant ROS   Objective  Vitals:   11/01/22 0848  BP: 136/80  Pulse: 74  Resp: 16  Temp: (!) 97.5 F (36.4 C)  TempSrc: Oral  SpO2: 98%  Weight: 222 lb 6.4 oz (100.9 kg)  Height: 5\' 6"  (1.676 m)    Body mass index is 35.9 kg/m.  Physical Exam Vitals reviewed.  Constitutional:      General: She is awake.     Appearance: Normal appearance. She is well-developed and well-groomed.  HENT:     Head: Normocephalic and atraumatic.  Cardiovascular:     Rate and Rhythm: Normal rate and regular rhythm.     Pulses: Normal pulses.          Radial pulses are 2+ on the right side and 2+ on the left side.     Heart sounds: Normal heart sounds. No murmur heard.    No friction rub. No gallop.  Pulmonary:     Effort: Pulmonary effort is normal.     Breath sounds: Normal breath sounds. No decreased air movement. No decreased breath sounds, wheezing, rhonchi or rales.  Musculoskeletal:     Cervical back: Normal range of motion.     Right lower leg: No edema.     Left lower leg: No edema.   Lymphadenopathy:     Head:     Right side of head: No submental or submandibular adenopathy.     Left side of head: No submental or submandibular adenopathy.     Cervical:     Right cervical: No superficial cervical adenopathy.    Left cervical: No superficial cervical adenopathy.     Upper Body:     Right upper body: No supraclavicular adenopathy.     Left upper body: No supraclavicular adenopathy.  Neurological:     Mental Status: She is alert.  Psychiatric:  Behavior: Behavior is cooperative.      Recent Results (from the past 2160 hour(s))  Lipase     Status: None   Collection Time: 10/25/22  4:10 PM  Result Value Ref Range   Lipase 24 7 - 60 U/L  COMPLETE METABOLIC PANEL WITH GFR     Status: None   Collection Time: 10/25/22  4:10 PM  Result Value Ref Range   Glucose, Bld 96 65 - 99 mg/dL    Comment: .            Fasting reference interval .    BUN 15 7 - 25 mg/dL   Creat 0.98 1.19 - 1.47 mg/dL   eGFR 65 > OR = 60 WG/NFA/2.13Y8   BUN/Creatinine Ratio SEE NOTE: 6 - 22 (calc)    Comment:    Not Reported: BUN and Creatinine are within    reference range. .    Sodium 141 135 - 146 mmol/L   Potassium 5.0 3.5 - 5.3 mmol/L   Chloride 103 98 - 110 mmol/L   CO2 29 20 - 32 mmol/L   Calcium 9.9 8.6 - 10.4 mg/dL   Total Protein 7.1 6.1 - 8.1 g/dL   Albumin 4.2 3.6 - 5.1 g/dL   Globulin 2.9 1.9 - 3.7 g/dL (calc)   AG Ratio 1.4 1.0 - 2.5 (calc)   Total Bilirubin 0.3 0.2 - 1.2 mg/dL   Alkaline phosphatase (APISO) 53 37 - 153 U/L   AST 13 10 - 35 U/L   ALT 10 6 - 29 U/L  CBC w/Diff/Platelet     Status: None   Collection Time: 10/25/22  4:10 PM  Result Value Ref Range   WBC 7.5 3.8 - 10.8 Thousand/uL   RBC 4.90 3.80 - 5.10 Million/uL   Hemoglobin 13.7 11.7 - 15.5 g/dL   HCT 65.7 84.6 - 96.2 %   MCV 87.3 80.0 - 100.0 fL   MCH 28.0 27.0 - 33.0 pg   MCHC 32.0 32.0 - 36.0 g/dL    Comment: For adults, a slight decrease in the calculated MCHC value (in the range of  30 to 32 g/dL) is most likely not clinically significant; however, it should be interpreted with caution in correlation with other red cell parameters and the patient's clinical condition.    RDW 13.1 11.0 - 15.0 %   Platelets 355 140 - 400 Thousand/uL   MPV 11.1 7.5 - 12.5 fL   Neutro Abs 3,668 1,500 - 7,800 cells/uL   Lymphs Abs 3,053 850 - 3,900 cells/uL   Absolute Monocytes 510 200 - 950 cells/uL   Eosinophils Absolute 180 15 - 500 cells/uL   Basophils Absolute 90 0 - 200 cells/uL   Neutrophils Relative % 48.9 %   Total Lymphocyte 40.7 %   Monocytes Relative 6.8 %   Eosinophils Relative 2.4 %   Basophils Relative 1.2 %  HgB A1c     Status: Abnormal   Collection Time: 10/25/22  4:10 PM  Result Value Ref Range   Hgb A1c MFr Bld 6.1 (H) <5.7 % of total Hgb    Comment: For someone without known diabetes, a hemoglobin  A1c value between 5.7% and 6.4% is consistent with prediabetes and should be confirmed with a  follow-up test. . For someone with known diabetes, a value <7% indicates that their diabetes is well controlled. A1c targets should be individualized based on duration of diabetes, age, comorbid conditions, and other considerations. . This assay result is consistent with an increased  risk of diabetes. . Currently, no consensus exists regarding use of hemoglobin A1c for diagnosis of diabetes for children. .    Mean Plasma Glucose 128 mg/dL   eAG (mmol/L) 7.1 mmol/L     PHQ2/9:    11/01/2022    8:47 AM 10/25/2022    3:22 PM 09/09/2022    9:58 AM 09/01/2022   11:44 AM 07/23/2022    9:51 AM  Depression screen PHQ 2/9  Decreased Interest 0 0 0 0 0  Down, Depressed, Hopeless 0 0 0  0  PHQ - 2 Score 0 0 0 0 0  Altered sleeping 0 0   0  Tired, decreased energy 0 0   0  Change in appetite 0 0   0  Feeling bad or failure about yourself  0 0   0  Trouble concentrating 0 0   0  Moving slowly or fidgety/restless 0 0   0  Suicidal thoughts 0 0   0  PHQ-9 Score 0 0    0  Difficult doing work/chores Not difficult at all Not difficult at all Not difficult at all  Not difficult at all      Fall Risk:    11/01/2022    8:47 AM 10/25/2022    3:17 PM 09/09/2022    9:54 AM 09/01/2022   11:44 AM 07/23/2022    9:51 AM  Fall Risk   Falls in the past year? 0 0 0 0 0  Number falls in past yr: 0 0 0 0 0  Injury with Fall? 0 0 0 0 0  Risk for fall due to : No Fall Risks  No Fall Risks  No Fall Risks  Follow up Falls prevention discussed;Education provided;Falls evaluation completed  Education provided;Falls prevention discussed  Falls prevention discussed;Education provided;Falls evaluation completed      Functional Status Survey: Is the patient deaf or have difficulty hearing?: No Does the patient have difficulty seeing, even when wearing glasses/contacts?: No Does the patient have difficulty concentrating, remembering, or making decisions?: No Does the patient have difficulty walking or climbing stairs?: Yes Does the patient have difficulty dressing or bathing?: No Does the patient have difficulty doing errands alone such as visiting a doctor's office or shopping?: No    Assessment & Plan  Problem List Items Addressed This Visit       Cardiovascular and Mediastinum   Essential hypertension, benign - Primary (Chronic)    Chronic, historic condition Patient reports she has not been taking valsartan as directed.  She states that she never started it She reports home BP measures and 140s/ 80s and BP today is above goal Recommend that she starts the valsartan 80 mg p.o. daily and checks blood pressure at home, keep a log to review at follow-up Recommend that she continue with diet and exercise Follow-up in 3 months or sooner if concerns arise      Aortic atherosclerosis (HCC)    Chronic, historic condition Patient is currently taking rosuvastatin 10 mg p.o. daily along with daily fish oil Continue current management Recheck lipid panel at  follow-up Follow-up in 3 months or sooner if concerns arise        Other   Obesity (BMI 35.0-39.9 without comorbidity)    Chronic, historic condition Patient is currently taking semaglutide 0.25 mg weekly injection and appears to be tolerating well so far Today we reviewed administration, dosing, most common side effects.  Allowed patient to ask questions until she voiced satisfaction Recommend she continues  with current regimen. Follow-up in 3 months or sooner if concerns arise        Return in about 3 months (around 02/01/2023) for HTN, HLD, prediabetes .   I, Colena Ketterman E Abbi Mancini, PA-C, have reviewed all documentation for this visit. The documentation on 11/01/22 for the exam, diagnosis, procedures, and orders are all accurate and complete.   Jacquelin Hawking, MHS, PA-C [facility]

## 2022-11-16 ENCOUNTER — Ambulatory Visit
Admission: RE | Admit: 2022-11-16 | Discharge: 2022-11-16 | Disposition: A | Discharge status: Home / Self Care | Payer: Medicare Other | Source: Ambulatory Visit | Attending: Internal Medicine | Admitting: Internal Medicine

## 2022-11-16 DIAGNOSIS — Z13820 Encounter for screening for osteoporosis: Secondary | ICD-10-CM | POA: Diagnosis present

## 2022-11-16 DIAGNOSIS — F17200 Nicotine dependence, unspecified, uncomplicated: Secondary | ICD-10-CM | POA: Insufficient documentation

## 2022-11-16 DIAGNOSIS — M8589 Other specified disorders of bone density and structure, multiple sites: Secondary | ICD-10-CM | POA: Insufficient documentation

## 2022-11-16 DIAGNOSIS — Z780 Asymptomatic menopausal state: Secondary | ICD-10-CM | POA: Diagnosis not present

## 2022-12-14 ENCOUNTER — Other Ambulatory Visit: Payer: Self-pay | Admitting: Nurse Practitioner

## 2022-12-14 DIAGNOSIS — E66812 Obesity, class 2: Secondary | ICD-10-CM

## 2022-12-15 NOTE — Telephone Encounter (Signed)
Requested Prescriptions  Pending Prescriptions Disp Refills   OZEMPIC, 0.25 OR 0.5 MG/DOSE, 2 MG/3ML SOPN [Pharmacy Med Name: OZEMPIC 0.25 OR 0.5MG  DOS(2MG /3ML)] 3 mL 0    Sig: INJECT 0.25MG  UNDER THE SKIN ONCE A WEEK     Endocrinology:  Diabetes - GLP-1 Receptor Agonists - semaglutide Failed - 12/14/2022 11:09 AM      Failed - HBA1C in normal range and within 180 days    HB A1C (BAYER DCA - WAIVED)  Date Value Ref Range Status  04/27/2016 6.0 <7.0 % Final    Comment:                                          Diabetic Adult            <7.0                                       Healthy Adult        4.3 - 5.7                                                           (DCCT/NGSP) American Diabetes Association's Summary of Glycemic Recommendations for Adults with Diabetes: Hemoglobin A1c <7.0%. More stringent glycemic goals (A1c <6.0%) may further reduce complications at the cost of increased risk of hypoglycemia.    Hgb A1c MFr Bld  Date Value Ref Range Status  10/25/2022 6.1 (H) <5.7 % of total Hgb Final    Comment:    For someone without known diabetes, a hemoglobin  A1c value between 5.7% and 6.4% is consistent with prediabetes and should be confirmed with a  follow-up test. . For someone with known diabetes, a value <7% indicates that their diabetes is well controlled. A1c targets should be individualized based on duration of diabetes, age, comorbid conditions, and other considerations. . This assay result is consistent with an increased risk of diabetes. . Currently, no consensus exists regarding use of hemoglobin A1c for diagnosis of diabetes for children. .          Passed - Cr in normal range and within 360 days    Creat  Date Value Ref Range Status  10/25/2022 0.94 0.60 - 1.00 mg/dL Final   Creatinine, Urine  Date Value Ref Range Status  12/24/2016 50 20 - 275 mg/dL Final         Passed - Valid encounter within last 6 months    Recent Outpatient Visits            1 month ago Essential hypertension, benign   [facility] Mecum, Oswaldo Conroy, PA-C   1 month ago LUQ pain   [facility] Margarita Mail, DO   3 months ago Left hip pain   [facility] Berniece Salines, FNP   4 months ago Pharyngitis, unspecified etiology   [facility] Danelle Berry, PA-C   6 months ago Bronchitis   [facility] Danelle Berry, [place]       Future Appointments  In 1 month Danelle Berry, PA-C [facility], [facility]

## 2023-01-07 ENCOUNTER — Other Ambulatory Visit: Payer: Self-pay | Admitting: Nurse Practitioner

## 2023-01-07 DIAGNOSIS — I700 Atherosclerosis of aorta: Secondary | ICD-10-CM

## 2023-01-07 NOTE — Telephone Encounter (Signed)
Requested Prescriptions  Pending Prescriptions Disp Refills   rosuvastatin (CRESTOR) 10 MG tablet [Pharmacy Med Name: ROSUVASTATIN 10MG  TABLETS] 90 tablet 0    Sig: TAKE 1 TABLET(10 MG) BY MOUTH DAILY     Cardiovascular:  Antilipid - Statins 2 Failed - 01/07/2023  1:29 PM      Failed - Lipid Panel in normal range within the last 12 months    Cholesterol, Total  Date Value Ref Range Status  04/27/2016 154 100 - 199 mg/dL Final   Cholesterol  Date Value Ref Range Status  03/31/2022 163 <200 mg/dL Final   LDL Cholesterol (Calc)  Date Value Ref Range Status  03/31/2022 60 mg/dL (calc) Final    Comment:    Reference range: <100 . Desirable range <100 mg/dL for primary prevention;   <70 mg/dL for patients with CHD or diabetic patients  with > or = 2 CHD risk factors. Marland Kitchen LDL-C is now calculated using the Martin-Hopkins  calculation, which is a validated novel method providing  better accuracy than the Friedewald equation in the  estimation of LDL-C.  Horald Pollen et al. Lenox Ahr. 1324;401(02): 2061-2068  (http://education.[employer].com/faq/FAQ164)    HDL  Date Value Ref Range Status  03/31/2022 77 > OR = 50 mg/dL Final  72/53/6644 65 >03 mg/dL Final   Triglycerides  Date Value Ref Range Status  03/31/2022 183 (H) <150 mg/dL Final         Passed - Cr in normal range and within 360 days    Creat  Date Value Ref Range Status  10/25/2022 0.94 0.60 - 1.00 mg/dL Final   Creatinine, Urine  Date Value Ref Range Status  12/24/2016 50 20 - 275 mg/dL Final         Passed - Patient is not pregnant      Passed - Valid encounter within last 12 months    Recent Outpatient Visits           2 months ago Essential hypertension, benign   [facility] Mecum, Oswaldo Conroy, PA-C   2 months ago LUQ pain   [facility] Margarita Mail, DO   4 months ago Left hip pain   [facility] Berniece Salines, FNP   5  months ago Pharyngitis, unspecified etiology   [facility] Danelle Berry, PA-C   7 months ago Bronchitis   [facility] Danelle Berry, [place]       Future Appointments             In 3 weeks Danelle Berry, PA-C [facility], [facility]

## 2023-02-01 ENCOUNTER — Ambulatory Visit: Payer: Medicare Other | Admitting: Family Medicine

## 2023-02-02 ENCOUNTER — Ambulatory Visit (INDEPENDENT_AMBULATORY_CARE_PROVIDER_SITE_OTHER): Payer: Medicare Other | Admitting: Physician Assistant

## 2023-02-02 ENCOUNTER — Encounter: Payer: Self-pay | Admitting: Physician Assistant

## 2023-02-02 VITALS — BP 130/70 | HR 79 | Resp 16 | Ht 66.0 in

## 2023-02-02 DIAGNOSIS — J432 Centrilobular emphysema: Secondary | ICD-10-CM

## 2023-02-02 DIAGNOSIS — I700 Atherosclerosis of aorta: Secondary | ICD-10-CM

## 2023-02-02 DIAGNOSIS — F17210 Nicotine dependence, cigarettes, uncomplicated: Secondary | ICD-10-CM

## 2023-02-02 DIAGNOSIS — R7303 Prediabetes: Secondary | ICD-10-CM

## 2023-02-02 DIAGNOSIS — I10 Essential (primary) hypertension: Secondary | ICD-10-CM

## 2023-02-02 DIAGNOSIS — Z720 Tobacco use: Secondary | ICD-10-CM

## 2023-02-02 NOTE — Assessment & Plan Note (Signed)
 Chronic, historic condition Currently taking Valsartan  80 mg PO every day and appears to be tolerating well BP in goal today in office Continue current regimen Recommend she checks BP at home on routine basis and brings records to apt  Follow up in 3 months or sooner if concerns arise

## 2023-02-02 NOTE — Assessment & Plan Note (Signed)
Chronic, historic condition Patient is currently taking rosuvastatin 10 mg p.o. daily along with daily fish oil Continue current management Recheck lipid panel at follow-up Follow-up in 3 months or sooner if concerns arise

## 2023-02-02 NOTE — Assessment & Plan Note (Signed)
 Chronic, ongoing She is smoking about 6 cigarettes per day and is interested in ultimately stopping She would like to use nicotine patches but is not sure if these are covered by insurance. Recommend she calls insurance to determine coverage then we can potentially send in script to assist with cost Recommend continued efforts to taper down cigarette use until she is no longer smoking then starting nicotine replacement Total time discussing smoking cessation = 4 minutes Follow up in 3 months or sooner if concerns arise

## 2023-02-02 NOTE — Progress Notes (Signed)
 Established Patient Office Visit  Name: Allison Phillips   MRN: [id number]    DOB: Nov 04, 1950   Date:02/02/2023  Today's Provider: Glynis Lass, MHS, PA-C Introduced myself to the patient as a PA-C and provided education on APPs in clinical practice.         Subjective  Chief Complaint  Chief Complaint  Patient presents with   Medical Management of Chronic Issues    3 months   Hypertension   Hyperlipidemia    HPI   HYPERTENSION / Atherosclerosis  Satisfied with current treatment? yes Duration of hypertension: years BP monitoring frequency: a few times a month BP range:  BP medication side effects: no  BP meds: Valsartan  80 mg PO QD Duration of hyperlipidemia: years Cholesterol medication side effects: no Cholesterol supplements: fish oil  cholesterol medications: rosuvastatin  (crestor ) Medication compliance: good compliance Aspirin : yes Recent stressors: no Recurrent headaches: no Visual changes: no Palpitations: no Dyspnea: no Chest pain: no Lower extremity edema: no Dizzy/lightheaded: no  Emphysema/ COPD   status: stable Satisfied with current treatment?: yes She is using CPAP machine as directed  Oxygen use: no Dyspnea frequency: rare Cough frequency: rare  Rescue inhaler frequency:  does not have to use often  Limitation of activity: no Productive cough: no Last Spirometry: unsure Pneumovax: Up to Date Influenza:  need to verify-she states she got at [facility]   She is still smoking - about 6 cigarettes per day  She is interested in quitting. She does not think patches are covered by insurance and she has had some success with gum     Patient Active Problem List   Diagnosis Date Noted   Centrilobular emphysema (HCC) 04/14/2022   OSA (obstructive sleep apnea) 09/23/2020   Aortic atherosclerosis (HCC) 11/14/2019   Obesity (BMI 35.0-39.9 without comorbidity) 01/06/2018   Osteopenia 04/20/2017   Morbid obesity (HCC) 12/30/2016    Essential hypertension, benign 03/08/2016   Tobacco abuse     Past Surgical History:  Procedure Laterality Date   ANAL FISSURE REPAIR     APPENDECTOMY     CESAREAN SECTION  1978   COLONOSCOPY WITH PROPOFOL  N/A 12/18/2014   Procedure: COLONOSCOPY WITH PROPOFOL ;  Surgeon: Jerlean Mood, MD;  Location: [facility] ENDOSCOPY;  Service: Endoscopy;  Laterality: N/A;   COLONOSCOPY WITH PROPOFOL  N/A 02/17/2018   Procedure: COLONOSCOPY WITH PROPOFOL ;  Surgeon: Irby Mannan, MD;  Location: [facility] ENDOSCOPY;  Service: Endoscopy;  Laterality: N/A;   LIPOMA EXCISION  07/2015   right shoulder   LIPOMA EXCISION  11/19/2015   OOPHORECTOMY     for "fibroids" not sure which ovary    Family History  Problem Relation Age of Onset   Hypertension Mother    Kidney disease Mother    Alzheimer's disease Mother    Thyroid  cancer Son    Lung cancer Brother    Rectal cancer Brother    Breast cancer Neg Hx     Social History   Tobacco Use   Smoking status: Every Day    Current packs/day: 0.00    Average packs/day: 0.5 packs/day for 15.0 years (7.5 ttl pk-yrs)    Types: Cigarettes    Start date: 10/24/2001    Last attempt to quit: 10/24/2016    Years since quitting: 6.2   Smokeless tobacco: Never   Tobacco comments:    8-10 cigarettes daily- 06/09/2021  Substance Use Topics   Alcohol use: Yes    Alcohol/week: 4.0 standard drinks  of alcohol    Types: 4 Standard drinks or equivalent per week    Comment: on occasion     Current Outpatient Medications:    albuterol  (VENTOLIN  HFA) 108 (90 Base) MCG/ACT inhaler, Inhale 2 puffs into the lungs every 4 (four) hours as needed for wheezing or shortness of breath., Disp: 1 each, Rfl: 4   aspirin  EC 81 MG tablet, Take 1 tablet (81 mg total) by mouth daily. Swallow whole., Disp: 100 tablet, Rfl: 1   Cyanocobalamin (B-12) 500 MCG TABS, Take 2,500 mcg by mouth daily. , Disp: , Rfl:    Gluc-Chonn-MSM-Boswellia-Vit D (GLUCOSAMINE CHONDROITIN + D3 PO),  Take by mouth., Disp: , Rfl:    meloxicam  (MOBIC ) 15 MG tablet, Take 0.5-1 tablets (7.5-15 mg total) by mouth daily., Disp: 90 tablet, Rfl: 1   Multiple Vitamin (MULTIVITAMIN) tablet, Take 1 tablet by mouth daily. Centrum Silver, Disp: , Rfl:    Omega-3 Fatty Acids (FISH OIL PO), Take 1,200 mg daily by mouth. , Disp: , Rfl:    OZEMPIC , 0.25 OR 0.5 MG/DOSE, 2 MG/3ML SOPN, INJECT 0.25MG  UNDER THE SKIN ONCE A WEEK, Disp: 3 mL, Rfl: 0   pantoprazole  (PROTONIX ) 40 MG tablet, TAKE 1 TABLET(40 MG) BY MOUTH EVERY MORNING 1 HOUR BEFORE BREAKFAST, Disp: 90 tablet, Rfl: 0   rosuvastatin  (CRESTOR ) 10 MG tablet, TAKE 1 TABLET(10 MG) BY MOUTH DAILY, Disp: 90 tablet, Rfl: 0   triamcinolone  (KENALOG ) 0.025 % ointment, Apply 1 Application topically 2 (two) times daily as needed., Disp: 30 g, Rfl: 0   valsartan  (DIOVAN ) 80 MG tablet, TAKE 1 TABLET(80 MG) BY MOUTH DAILY, Disp: 90 tablet, Rfl: 0  Allergies  Allergen Reactions   Adhesive [Tape]     Rash/dermatitis    I personally reviewed active problem list, medication list, allergies, health maintenance, notes from last encounter, lab results with the patient/caregiver today.   ROS  See HPI for relevant ROS    Objective  Vitals:   02/02/23 0843  BP: 130/70  Pulse: 79  Resp: 16  SpO2: 99%  Height: 5\' 6"  (1.676 m)    Body mass index is 35.9 kg/m.  Physical Exam Vitals reviewed.  Constitutional:      General: She is awake.     Appearance: Normal appearance. She is well-developed and well-groomed.  HENT:     Head: Normocephalic and atraumatic.  Cardiovascular:     Rate and Rhythm: Normal rate and regular rhythm.     Pulses: Normal pulses.          Radial pulses are 2+ on the right side and 2+ on the left side.     Heart sounds: Normal heart sounds. No murmur heard.    No friction rub. No gallop.  Pulmonary:     Effort: Pulmonary effort is normal.     Breath sounds: Normal breath sounds. No decreased air movement. No decreased breath  sounds, wheezing, rhonchi or rales.  Musculoskeletal:     Cervical back: Normal range of motion.     Right lower leg: No edema.     Left lower leg: No edema.  Neurological:     General: No focal deficit present.     Mental Status: She is alert and oriented to person, place, and time. Mental status is at baseline.     GCS: GCS eye subscore is 4. GCS verbal subscore is 5. GCS motor subscore is 6.  Psychiatric:        Attention and Perception: Attention and perception normal.  Mood and Affect: Mood and affect normal.        Speech: Speech normal.        Behavior: Behavior normal. Behavior is cooperative.        Thought Content: Thought content normal.        Cognition and Memory: Cognition normal.      No results found for this or any previous visit (from the past 2160 hours).   PHQ2/9:    11/01/2022    8:47 AM 10/25/2022    3:22 PM 09/09/2022    9:58 AM 09/01/2022   11:44 AM 07/23/2022    9:51 AM  Depression screen PHQ 2/9  Decreased Interest 0 0 0 0 0  Down, Depressed, Hopeless 0 0 0  0  PHQ - 2 Score 0 0 0 0 0  Altered sleeping 0 0   0  Tired, decreased energy 0 0   0  Change in appetite 0 0   0  Feeling bad or failure about yourself  0 0   0  Trouble concentrating 0 0   0  Moving slowly or fidgety/restless 0 0   0  Suicidal thoughts 0 0   0  PHQ-9 Score 0 0   0  Difficult doing work/chores Not difficult at all Not difficult at all Not difficult at all  Not difficult at all      Fall Risk:    11/01/2022    8:47 AM 10/25/2022    3:17 PM 09/09/2022    9:54 AM 09/01/2022   11:44 AM 07/23/2022    9:51 AM  Fall Risk   Falls in the past year? 0 0 0 0 0  Number falls in past yr: 0 0 0 0 0  Injury with Fall? 0 0 0 0 0  Risk for fall due to : No Fall Risks  No Fall Risks  No Fall Risks  Follow up Falls prevention discussed;Education provided;Falls evaluation completed  Education provided;Falls prevention discussed  Falls prevention discussed;Education provided;Falls  evaluation completed      Functional Status Survey:      Assessment & Plan  Problem List Items Addressed This Visit       Cardiovascular and Mediastinum   Essential hypertension, benign - Primary (Chronic)   Chronic, historic condition Currently taking Valsartan  80 mg PO every day and appears to be tolerating well BP in goal today in office Continue current regimen Recommend she checks BP at home on routine basis and brings records to apt  Follow up in 3 months or sooner if concerns arise        Aortic atherosclerosis (HCC)   Chronic, historic condition Patient is currently taking rosuvastatin  10 mg p.o. daily along with daily fish oil Continue current management Recheck lipid panel at follow-up Follow-up in 3 months or sooner if concerns arise        Respiratory   Centrilobular emphysema (HCC)   Chronic, ongoing She denies SOB or coughing, limitation of activity She rarely uses Albuterol  inhaler and reports she is using CPAP at night She has Lung cancer screening scheduled in Feb 2025  Continue current regimen for now Recommend smoking cessation -discussed options and patient willingness to stop  Follow up in 3 months or sooner if concerns arise          Other   Tobacco abuse   Chronic, ongoing She is smoking about 6 cigarettes per day and is interested in ultimately stopping She would like to use nicotine patches  but is not sure if these are covered by insurance. Recommend she calls insurance to determine coverage then we can potentially send in script to assist with cost Recommend continued efforts to taper down cigarette use until she is no longer smoking then starting nicotine replacement Total time discussing smoking cessation = 4 minutes Follow up in 3 months or sooner if concerns arise          Return in about 3 months (around 05/03/2023) for HTN, HLD.   I, Noriah Osgood E Kennita Pavlovich, PA-C, have reviewed all documentation for this visit. The documentation on  02/02/23 for the exam, diagnosis, procedures, and orders are all accurate and complete.   Glynis Lass, MHS, PA-C [facility]

## 2023-02-02 NOTE — Assessment & Plan Note (Signed)
 Chronic, ongoing She denies SOB or coughing, limitation of activity She rarely uses Albuterol  inhaler and reports she is using CPAP at night She has Lung cancer screening scheduled in Feb 2025  Continue current regimen for now Recommend smoking cessation -discussed options and patient willingness to stop  Follow up in 3 months or sooner if concerns arise

## 2023-02-09 ENCOUNTER — Other Ambulatory Visit: Payer: Self-pay | Admitting: Nurse Practitioner

## 2023-02-09 NOTE — Telephone Encounter (Signed)
Requested Prescriptions  Pending Prescriptions Disp Refills   OZEMPIC, 0.25 OR 0.5 MG/DOSE, 2 MG/3ML SOPN [Pharmacy Med Name: OZEMPIC 0.25 OR 0.5MG  DOS(2MG /3ML)] 3 mL 0    Sig: INJECT 0.25MG  UNDER THE SKIN ONCE A WEEK     Endocrinology:  Diabetes - GLP-1 Receptor Agonists - semaglutide Failed - 02/09/2023 12:03 PM      Failed - HBA1C in normal range and within 180 days    HB A1C (BAYER DCA - WAIVED)  Date Value Ref Range Status  04/27/2016 6.0 <7.0 % Final    Comment:                                          Diabetic Adult            <7.0                                       Healthy Adult        4.3 - 5.7                                                           (DCCT/NGSP) American Diabetes Association's Summary of Glycemic Recommendations for Adults with Diabetes: Hemoglobin A1c <7.0%. More stringent glycemic goals (A1c <6.0%) may further reduce complications at the cost of increased risk of hypoglycemia.    Hgb A1c MFr Bld  Date Value Ref Range Status  10/25/2022 6.1 (H) <5.7 % of total Hgb Final    Comment:    For someone without known diabetes, a hemoglobin  A1c value between 5.7% and 6.4% is consistent with prediabetes and should be confirmed with a  follow-up test. . For someone with known diabetes, a value <7% indicates that their diabetes is well controlled. A1c targets should be individualized based on duration of diabetes, age, comorbid conditions, and other considerations. . This assay result is consistent with an increased risk of diabetes. . Currently, no consensus exists regarding use of hemoglobin A1c for diagnosis of diabetes for children. .          Passed - Cr in normal range and within 360 days    Creat  Date Value Ref Range Status  10/25/2022 0.94 0.60 - 1.00 mg/dL Final   Creatinine, Urine  Date Value Ref Range Status  12/24/2016 50 20 - 275 mg/dL Final         Passed - Valid encounter within last 6 months    Recent Outpatient Visits            1 week ago Essential hypertension, benign   [place] [facility] Mecum, Oswaldo Conroy, PA-C   3 months ago Essential hypertension, benign   [place] [facility] Mecum, Oswaldo Conroy, PA-C   3 months ago LUQ pain   [facility] Margarita Mail, DO   5 months ago Left hip pain   [facility] Berniece Salines, FNP   6 months ago Pharyngitis, unspecified etiology   [facility] Berry, [place]       Future Appointments  In 2 months Danelle Berry, PA-C [facility]

## 2023-02-17 ENCOUNTER — Other Ambulatory Visit: Payer: Self-pay | Admitting: Nurse Practitioner

## 2023-02-17 DIAGNOSIS — Z1231 Encounter for screening mammogram for malignant neoplasm of breast: Secondary | ICD-10-CM

## 2023-03-14 ENCOUNTER — Ambulatory Visit
Admission: RE | Admit: 2023-03-14 | Discharge: 2023-03-14 | Disposition: A | Discharge status: Home / Self Care | Payer: Medicare Other | Source: Ambulatory Visit | Attending: Acute Care | Admitting: Acute Care

## 2023-03-14 DIAGNOSIS — F17210 Nicotine dependence, cigarettes, uncomplicated: Secondary | ICD-10-CM | POA: Diagnosis present

## 2023-03-14 DIAGNOSIS — Z87891 Personal history of nicotine dependence: Secondary | ICD-10-CM | POA: Diagnosis present

## 2023-04-04 ENCOUNTER — Other Ambulatory Visit: Payer: Self-pay | Admitting: Acute Care

## 2023-04-04 ENCOUNTER — Ambulatory Visit
Admission: RE | Admit: 2023-04-04 | Discharge: 2023-04-04 | Disposition: A | Discharge status: Home / Self Care | Payer: Medicare Other | Source: Ambulatory Visit | Attending: Nurse Practitioner | Admitting: Nurse Practitioner

## 2023-04-04 DIAGNOSIS — Z1231 Encounter for screening mammogram for malignant neoplasm of breast: Secondary | ICD-10-CM | POA: Diagnosis present

## 2023-04-04 DIAGNOSIS — F17210 Nicotine dependence, cigarettes, uncomplicated: Secondary | ICD-10-CM

## 2023-04-04 DIAGNOSIS — Z122 Encounter for screening for malignant neoplasm of respiratory organs: Secondary | ICD-10-CM

## 2023-04-04 DIAGNOSIS — Z87891 Personal history of nicotine dependence: Secondary | ICD-10-CM

## 2023-04-12 ENCOUNTER — Other Ambulatory Visit: Payer: Self-pay | Admitting: Family Medicine

## 2023-04-12 DIAGNOSIS — I700 Atherosclerosis of aorta: Secondary | ICD-10-CM

## 2023-04-12 LAB — HEMOGLOBIN A1C: Hemoglobin A1C: 6.6

## 2023-05-03 ENCOUNTER — Encounter: Payer: Self-pay | Admitting: Family Medicine

## 2023-05-03 ENCOUNTER — Ambulatory Visit: Payer: Self-pay | Admitting: Family Medicine

## 2023-05-03 VITALS — BP 122/70 | HR 72 | Resp 16 | Ht 66.0 in | Wt 217.0 lb

## 2023-05-03 DIAGNOSIS — K219 Gastro-esophageal reflux disease without esophagitis: Secondary | ICD-10-CM

## 2023-05-03 DIAGNOSIS — Z5181 Encounter for therapeutic drug level monitoring: Secondary | ICD-10-CM

## 2023-05-03 DIAGNOSIS — E1159 Type 2 diabetes mellitus with other circulatory complications: Secondary | ICD-10-CM | POA: Diagnosis not present

## 2023-05-03 DIAGNOSIS — I700 Atherosclerosis of aorta: Secondary | ICD-10-CM

## 2023-05-03 DIAGNOSIS — J432 Centrilobular emphysema: Secondary | ICD-10-CM

## 2023-05-03 DIAGNOSIS — Z6835 Body mass index (BMI) 35.0-35.9, adult: Secondary | ICD-10-CM

## 2023-05-03 DIAGNOSIS — I10 Essential (primary) hypertension: Secondary | ICD-10-CM

## 2023-05-03 DIAGNOSIS — E119 Type 2 diabetes mellitus without complications: Secondary | ICD-10-CM

## 2023-05-03 DIAGNOSIS — E785 Hyperlipidemia, unspecified: Secondary | ICD-10-CM | POA: Insufficient documentation

## 2023-05-03 DIAGNOSIS — Z1211 Encounter for screening for malignant neoplasm of colon: Secondary | ICD-10-CM

## 2023-05-03 MED ORDER — VALSARTAN 80 MG PO TABS: 80.0000 mg | ORAL_TABLET | Freq: Every day | ORAL | 1 refills | Status: DC

## 2023-05-03 MED ORDER — OZEMPIC (0.25 OR 0.5 MG/DOSE) 2 MG/3ML ~~LOC~~ SOPN
0.5000 mg | PEN_INJECTOR | SUBCUTANEOUS | 1 refills | Status: DC
Start: 2023-05-03 — End: 2023-11-03

## 2023-05-03 NOTE — Patient Instructions (Addendum)
 Health Maintenance  Topic Date Due   Yearly kidney health urinalysis for diabetes  12/24/2017   Eye exam for diabetics  06/01/2018   COVID-19 Vaccine (7 - 2024-25 season) 09/19/2022   Colon Cancer Screening  02/18/2023   Flu Shot  08/19/2023   Medicare Annual Wellness Visit  09/09/2023   Hemoglobin A1C  10/13/2023   Yearly kidney function blood test for diabetes  10/25/2023   Mammogram  04/03/2024   Complete foot exam   05/02/2024   DEXA scan (bone density measurement)  11/15/2024   Pneumonia Vaccine  Completed   Hepatitis C Screening  Completed   Zoster (Shingles) Vaccine  Completed   HPV Vaccine  Aged Out   Meningitis B Vaccine  Aged Out   DTaP/Tdap/Td vaccine  Discontinued   Go to the Eye doc and ask them about doing your vision and also the Diabetic eye exam We will refer you to GI for setting up your colonoscopy and colon cancer screening

## 2023-05-03 NOTE — Assessment & Plan Note (Signed)
Using PPI intermittently

## 2023-05-03 NOTE — Progress Notes (Signed)
 Name: Allison Phillips   MRN: [id number]    DOB: Apr 24, 1950   Date:05/03/2023       Progress Note  Chief Complaint  Patient presents with   Medical Management of Chronic Issues     Subjective:   Allison Phillips is a 73 y.o. female, presents to clinic for routine follow up on chronic conditions  Pt on ozempic since last Oct on 0.25 mg dose, never increased, some curbed appetite and no SE or concerns Due for DM foot exam  Lab Results  Component Value Date   HGBA1C 6.6 04/12/2023   Obesity - some weight loss with diet/lifestyle efforts Wt Readings from Last 5 Encounters:  05/03/23 217 lb (98.4 kg)  03/14/23 214 lb (97.1 kg)  11/01/22 222 lb 6.4 oz (100.9 kg)  10/25/22 222 lb 6.4 oz (100.9 kg)  09/09/22 223 lb (101.2 kg)   BMI Readings from Last 5 Encounters:  05/03/23 35.02 kg/m  03/14/23 34.54 kg/m  02/02/23 35.90 kg/m  11/01/22 35.90 kg/m  10/25/22 35.90 kg/m   Hypertension:  Currently managed on valsartan 80 mg Pt reports good med compliance and denies any SE.   Blood pressure today is well controlled. BP Readings from Last 3 Encounters:  05/03/23 122/70  02/02/23 130/70  11/01/22 136/80   Pt denies CP, SOB, exertional sx, LE edema, palpitation, Ha's, visual disturbances, lightheadedness, hypotension, syncope.  Hyperlipidemia: Currently treated with crestor 10mg , pt reports good med compliance Last Lipids: Lab Results  Component Value Date   CHOL 163 03/31/2022   HDL 77 03/31/2022   LDLCALC 60 03/31/2022   TRIG 183 (H) 03/31/2022   CHOLHDL 2.1 03/31/2022   - Denies: Chest pain, shortness of breath, myalgias, claudication     Current Outpatient Medications:    albuterol (VENTOLIN HFA) 108 (90 Base) MCG/ACT inhaler, Inhale 2 puffs into the lungs every 4 (four) hours as needed for wheezing or shortness of breath., Disp: 1 each, Rfl: 4   aspirin EC 81 MG tablet, Take 1 tablet (81 mg total) by mouth daily. Swallow whole., Disp: 100 tablet, Rfl: 1    Cyanocobalamin (B-12) 500 MCG TABS, Take 2,500 mcg by mouth daily. , Disp: , Rfl:    Gluc-Chonn-MSM-Boswellia-Vit D (GLUCOSAMINE CHONDROITIN + D3 PO), Take by mouth., Disp: , Rfl:    meloxicam (MOBIC) 15 MG tablet, Take 0.5-1 tablets (7.5-15 mg total) by mouth daily., Disp: 90 tablet, Rfl: 1   Multiple Vitamin (MULTIVITAMIN) tablet, Take 1 tablet by mouth daily. Centrum Silver, Disp: , Rfl:    Omega-3 Fatty Acids (FISH OIL PO), Take 1,200 mg daily by mouth. , Disp: , Rfl:    pantoprazole (PROTONIX) 40 MG tablet, TAKE 1 TABLET(40 MG) BY MOUTH EVERY MORNING 1 HOUR BEFORE BREAKFAST, Disp: 90 tablet, Rfl: 0   rosuvastatin (CRESTOR) 10 MG tablet, TAKE 1 TABLET(10 MG) BY MOUTH DAILY, Disp: 90 tablet, Rfl: 0   Semaglutide,0.25 or 0.5MG /DOS, (OZEMPIC, 0.25 OR 0.5 MG/DOSE,) 2 MG/3ML SOPN, INJECT 0.25MG  UNDER THE SKIN ONCE A WEEK, Disp: 9 mL, Rfl: 0   triamcinolone (KENALOG) 0.025 % ointment, Apply 1 Application topically 2 (two) times daily as needed., Disp: 30 g, Rfl: 0   valsartan (DIOVAN) 80 MG tablet, TAKE 1 TABLET(80 MG) BY MOUTH DAILY, Disp: 90 tablet, Rfl: 0  Patient Active Problem List   Diagnosis Date Noted   Centrilobular emphysema (HCC) 04/14/2022   OSA (obstructive sleep apnea) 09/23/2020   Aortic atherosclerosis (HCC) 11/14/2019   Obesity (BMI 35.0-39.9 without comorbidity) 01/06/2018  Osteopenia 04/20/2017   Morbid obesity (HCC) 12/30/2016   Essential hypertension, benign 03/08/2016   Tobacco abuse     Past Surgical History:  Procedure Laterality Date   ANAL FISSURE REPAIR     APPENDECTOMY     CESAREAN SECTION  1978   COLONOSCOPY WITH PROPOFOL N/A 12/18/2014   Procedure: COLONOSCOPY WITH PROPOFOL;  Surgeon: Jerlean Mood, MD;  Location: [facility];  Service: Endoscopy;  Laterality: N/A;   COLONOSCOPY WITH PROPOFOL N/A 02/17/2018   Procedure: COLONOSCOPY WITH PROPOFOL;  Surgeon: Irby Mannan, MD;  Location: [facility];  Service: Endoscopy;  Laterality: N/A;    LIPOMA EXCISION  07/2015   right shoulder   LIPOMA EXCISION  11/19/2015   OOPHORECTOMY     for "fibroids" not sure which ovary    Family History  Problem Relation Age of Onset   Hypertension Mother    Kidney disease Mother    Alzheimer's disease Mother    Thyroid cancer Son    Lung cancer Brother    Rectal cancer Brother    Breast cancer Neg Hx     Social History   Tobacco Use   Smoking status: Every Day    Current packs/day: 0.00    Average packs/day: 0.5 packs/day for 15.0 years (7.5 ttl pk-yrs)    Types: Cigarettes    Start date: 10/24/2001    Last attempt to quit: 10/24/2016    Years since quitting: 6.5   Smokeless tobacco: Never   Tobacco comments:    8-10 cigarettes daily- 06/09/2021  Vaping Use   Vaping status: Never Used  Substance Use Topics   Alcohol use: Yes    Alcohol/week: 4.0 standard drinks of alcohol    Types: 4 Standard drinks or equivalent per week    Comment: on occasion   Drug use: No     Allergies  Allergen Reactions   Adhesive [Tape]     Rash/dermatitis    Health Maintenance  Topic Date Due   COVID-19 Vaccine (7 - 2024-25 season) 09/19/2022   Colonoscopy  02/18/2023   INFLUENZA VACCINE  08/19/2023   Medicare Annual Wellness (AWV)  09/09/2023   MAMMOGRAM  04/03/2024   DEXA SCAN  11/15/2024   Pneumonia Vaccine 89+ Years old  Completed   Hepatitis C Screening  Completed   Zoster Vaccines- Shingrix  Completed   HPV VACCINES  Aged Out   Meningococcal B Vaccine  Aged Out   DTaP/Tdap/Td  Discontinued    Chart Review Today: I personally reviewed active problem list, medication list, allergies, family history, social history, health maintenance, notes from last encounter, lab results, imaging with the patient/caregiver today.   Review of Systems  Constitutional: Negative.   HENT: Negative.    Eyes: Negative.   Respiratory: Negative.    Cardiovascular: Negative.   Gastrointestinal: Negative.   Endocrine: Negative.   Genitourinary:  Negative.   Musculoskeletal: Negative.   Skin: Negative.   Allergic/Immunologic: Negative.   Neurological: Negative.   Hematological: Negative.   Psychiatric/Behavioral: Negative.    All other systems reviewed and are negative.    Objective:   Vitals:   05/03/23 0841  BP: 122/70  Pulse: 72  Resp: 16  SpO2: 99%  Weight: 217 lb (98.4 kg)  Height: 5\' 6"  (1.676 m)    Body mass index is 35.02 kg/m.  Physical Exam Vitals and nursing note reviewed.  Constitutional:      General: She is not in acute distress.    Appearance: Normal appearance. She is  well-developed. She is obese. She is not ill-appearing, toxic-appearing or diaphoretic.  HENT:     Head: Normocephalic and atraumatic.     Nose: Nose normal.  Eyes:     General:        Right eye: No discharge.        Left eye: No discharge.     Conjunctiva/sclera: Conjunctivae normal.  Neck:     Trachea: No tracheal deviation.  Cardiovascular:     Rate and Rhythm: Normal rate and regular rhythm.     Pulses: Normal pulses.     Heart sounds: Normal heart sounds. No murmur heard.    No friction rub. No gallop.  Pulmonary:     Effort: Pulmonary effort is normal. No respiratory distress.     Breath sounds: Normal breath sounds. No stridor.  Musculoskeletal:     Right lower leg: No edema.     Left lower leg: No edema.  Skin:    General: Skin is warm and dry.     Findings: No rash.  Neurological:     Mental Status: She is alert.     Motor: No abnormal muscle tone.     Coordination: Coordination normal.  Psychiatric:        Mood and Affect: Mood normal.        Behavior: Behavior normal.     Diabetic Foot Exam - Simple   Simple Foot Form Diabetic Foot exam was performed with the following findings: Yes 05/03/2023  9:28 AM  Visual Inspection No deformities, no ulcerations, no other skin breakdown bilaterally: Yes Sensation Testing Intact to touch and monofilament testing bilaterally: Yes Pulse Check Posterior Tibialis  and Dorsalis pulse intact bilaterally: Yes Comments        Results for orders placed or performed in visit on 10/25/22  Lipase   Collection Time: 10/25/22  4:10 PM  Result Value Ref Range   Lipase 24 7 - 60 U/L  COMPLETE METABOLIC PANEL WITH GFR   Collection Time: 10/25/22  4:10 PM  Result Value Ref Range   Glucose, Bld 96 65 - 99 mg/dL   BUN 15 7 - 25 mg/dL   Creat 5.40 9.81 - 1.91 mg/dL   eGFR 65 > OR = 60 YN/WGN/[id number]   BUN/Creatinine Ratio SEE NOTE: 6 - 22 (calc)   Sodium 141 135 - 146 mmol/L   Potassium 5.0 3.5 - 5.3 mmol/L   Chloride 103 98 - 110 mmol/L   CO2 29 20 - 32 mmol/L   Calcium 9.9 8.6 - 10.4 mg/dL   Total Protein 7.1 6.1 - 8.1 g/dL   Albumin 4.2 3.6 - 5.1 g/dL   Globulin 2.9 1.9 - 3.7 g/dL (calc)   AG Ratio 1.4 1.0 - 2.5 (calc)   Total Bilirubin 0.3 0.2 - 1.2 mg/dL   Alkaline phosphatase (APISO) 53 37 - 153 U/L   AST 13 10 - 35 U/L   ALT 10 6 - 29 U/L  CBC w/Diff/Platelet   Collection Time: 10/25/22  4:10 PM  Result Value Ref Range   WBC 7.5 3.8 - 10.8 Thousand/uL   RBC 4.90 3.80 - 5.10 Million/uL   Hemoglobin 13.7 11.7 - 15.5 g/dL   HCT 08.6 57.8 - 46.9 %   MCV 87.3 80.0 - 100.0 fL   MCH 28.0 27.0 - 33.0 pg   MCHC 32.0 32.0 - 36.0 g/dL   RDW 62.9 52.8 - 41.3 %   Platelets 355 140 - 400 Thousand/uL   MPV 11.1 7.5 - 12.5  fL   Neutro Abs 3,668 1,500 - 7,800 cells/uL   Lymphs Abs 3,053 850 - 3,900 cells/uL   Absolute Monocytes 510 200 - 950 cells/uL   Eosinophils Absolute 180 15 - 500 cells/uL   Basophils Absolute 90 0 - 200 cells/uL   Neutrophils Relative % 48.9 %   Total Lymphocyte 40.7 %   Monocytes Relative 6.8 %   Eosinophils Relative 2.4 %   Basophils Relative 1.2 %  HgB A1c   Collection Time: 10/25/22  4:10 PM  Result Value Ref Range   Hgb A1c MFr Bld 6.1 (H) <5.7 % of total Hgb   Mean Plasma Glucose 128 mg/dL   eAG (mmol/L) 7.1 mmol/L      Assessment & Plan:   Essential hypertension, benign Assessment & Plan: BP at goal today  on valsartan 80 mg BP Readings from Last 3 Encounters:  05/03/23 122/70  02/02/23 130/70  11/01/22 136/80  No med/dose changes, check labs today, continue meds and diet/lifestyle efforts  Orders: -     Valsartan; Take 1 tablet (80 mg total) by mouth daily.  Dispense: 90 tablet; Refill: 1 -     Comprehensive metabolic panel with GFR  Type 2 diabetes mellitus without complication, without long-term current use of insulin (HCC) Assessment & Plan: Has been well controlled and at goal, currently on ozempic only 0.25 mg dose Discussed increasing dose to 0.50 mg and then 1.0 mg dose to get most benefits from med DM foot exam done today, eye exam due, labs obtained today as well  Orders: -     Comprehensive metabolic panel with GFR -     Hemoglobin A1c -     Lipid panel -     Ozempic (0.25 or 0.5 MG/DOSE); Inject 0.5 mg into the skin once a week.  Dispense: 9 mL; Refill: 1  Centrilobular emphysema (HCC) Assessment & Plan: Sx stable and controlled   Severe obesity with body mass index (BMI) of 35.0 to 35.9 and comorbidity (HCC) Assessment & Plan: Some weight loss with diet efforts Discussed diet and calorie deficit efforts, increase ozempic dose gradually and it may have more weight loss benefit She is going to start tracking calories Wt Readings from Last 5 Encounters:  05/03/23 217 lb (98.4 kg)  03/14/23 214 lb (97.1 kg)  11/01/22 222 lb 6.4 oz (100.9 kg)  10/25/22 222 lb 6.4 oz (100.9 kg)  09/09/22 223 lb (101.2 kg)   BMI Readings from Last 5 Encounters:  05/03/23 35.02 kg/m  03/14/23 34.54 kg/m  02/02/23 35.90 kg/m  11/01/22 35.90 kg/m  10/25/22 35.90 kg/m     Orders: -     Ozempic (0.25 or 0.5 MG/DOSE); Inject 0.5 mg into the skin once a week.  Dispense: 9 mL; Refill: 1  Hyperlipidemia, unspecified hyperlipidemia type Assessment & Plan: Compliant with meds, no SE, no myalgias, fatigue or jaundice Due for lipids, doing well on crestor 10 mg daily Diet and  exercise recommendations for HLD reviewed and handout given   Orders: -     Comprehensive metabolic panel with GFR -     Lipid panel  Aortic atherosclerosis (HCC) Assessment & Plan: On CT, on statin, monitoring   Encounter for medication monitoring -     Comprehensive metabolic panel with GFR -     Hemoglobin A1c -     Lipid panel -     CBC with Differential/Platelet -     Microalbumin / creatinine urine ratio  Colon cancer screening  Gastroesophageal  reflux disease, unspecified whether esophagitis present Assessment & Plan: Using PPI intermittently      No follow-ups on file.   Adeline Hone, PA-C 05/03/23 9:10 AM

## 2023-05-03 NOTE — Assessment & Plan Note (Signed)
 Has been well controlled and at goal, currently on ozempic only 0.25 mg dose Discussed increasing dose to 0.50 mg and then 1.0 mg dose to get most benefits from med DM foot exam done today, eye exam due, labs obtained today as well

## 2023-05-03 NOTE — Assessment & Plan Note (Signed)
 BP at goal today on valsartan 80 mg BP Readings from Last 3 Encounters:  05/03/23 122/70  02/02/23 130/70  11/01/22 136/80  No med/dose changes, check labs today, continue meds and diet/lifestyle efforts

## 2023-05-03 NOTE — Assessment & Plan Note (Signed)
 Compliant with meds, no SE, no myalgias, fatigue or jaundice Due for lipids, doing well on crestor 10 mg daily Diet and exercise recommendations for HLD reviewed and handout given

## 2023-05-03 NOTE — Assessment & Plan Note (Signed)
 Sx stable and controlled

## 2023-05-03 NOTE — Assessment & Plan Note (Addendum)
 Some weight loss with diet efforts Discussed diet and calorie deficit efforts, increase ozempic dose gradually and it may have more weight loss benefit She is going to start tracking calories Wt Readings from Last 5 Encounters:  05/03/23 217 lb (98.4 kg)  03/14/23 214 lb (97.1 kg)  11/01/22 222 lb 6.4 oz (100.9 kg)  10/25/22 222 lb 6.4 oz (100.9 kg)  09/09/22 223 lb (101.2 kg)   BMI Readings from Last 5 Encounters:  05/03/23 35.02 kg/m  03/14/23 34.54 kg/m  02/02/23 35.90 kg/m  11/01/22 35.90 kg/m  10/25/22 35.90 kg/m

## 2023-05-03 NOTE — Assessment & Plan Note (Signed)
 On CT, on statin, monitoring

## 2023-05-04 ENCOUNTER — Other Ambulatory Visit: Payer: Self-pay | Admitting: Family Medicine

## 2023-05-04 ENCOUNTER — Encounter: Payer: Self-pay | Admitting: Family Medicine

## 2023-05-04 DIAGNOSIS — I700 Atherosclerosis of aorta: Secondary | ICD-10-CM

## 2023-05-04 LAB — CBC WITH DIFFERENTIAL/PLATELET
Absolute Lymphocytes: 2727 {cells}/uL (ref 850–3900)
Absolute Monocytes: 551 {cells}/uL (ref 200–950)
Basophils Absolute: 68 {cells}/uL (ref 0–200)
Basophils Relative: 1 %
Eosinophils Absolute: 388 {cells}/uL (ref 15–500)
Eosinophils Relative: 5.7 %
HCT: 40 % (ref 35.0–45.0)
Hemoglobin: 13.1 g/dL (ref 11.7–15.5)
MCH: 28.9 pg (ref 27.0–33.0)
MCHC: 32.8 g/dL (ref 32.0–36.0)
MCV: 88.1 fL (ref 80.0–100.0)
MPV: 11.1 fL (ref 7.5–12.5)
Monocytes Relative: 8.1 %
Neutro Abs: 3067 {cells}/uL (ref 1500–7800)
Neutrophils Relative %: 45.1 %
Platelets: 296 10*3/uL (ref 140–400)
RBC: 4.54 10*6/uL (ref 3.80–5.10)
RDW: 13 % (ref 11.0–15.0)
Total Lymphocyte: 40.1 %
WBC: 6.8 10*3/uL (ref 3.8–10.8)

## 2023-05-04 LAB — COMPREHENSIVE METABOLIC PANEL WITH GFR
AG Ratio: 2 (calc) (ref 1.0–2.5)
ALT: 11 U/L (ref 6–29)
AST: 16 U/L (ref 10–35)
Albumin: 4.6 g/dL (ref 3.6–5.1)
Alkaline phosphatase (APISO): 49 U/L (ref 37–153)
BUN: 16 mg/dL (ref 7–25)
CO2: 30 mmol/L (ref 20–32)
Calcium: 9.7 mg/dL (ref 8.6–10.4)
Chloride: 103 mmol/L (ref 98–110)
Creat: 0.97 mg/dL (ref 0.60–1.00)
Globulin: 2.3 g/dL (ref 1.9–3.7)
Glucose, Bld: 60 mg/dL — ABNORMAL LOW (ref 65–99)
Potassium: 5 mmol/L (ref 3.5–5.3)
Sodium: 139 mmol/L (ref 135–146)
Total Bilirubin: 0.4 mg/dL (ref 0.2–1.2)
Total Protein: 6.9 g/dL (ref 6.1–8.1)
eGFR: 62 mL/min/{1.73_m2} (ref >=60)

## 2023-05-04 LAB — MICROALBUMIN / CREATININE URINE RATIO
Creatinine, Urine: 47 mg/dL (ref 20–275)
Microalb Creat Ratio: 4 mg/g{creat} (ref <30)
Microalb, Ur: 0.2 mg/dL

## 2023-05-04 LAB — LIPID PANEL
Cholesterol: 125 mg/dL (ref <200)
HDL: 85 mg/dL (ref >=50)
LDL Cholesterol (Calc): 27 mg/dL
Non-HDL Cholesterol (Calc): 40 mg/dL (ref <130)
Total CHOL/HDL Ratio: 1.5 (calc) (ref <5.0)
Triglycerides: 54 mg/dL (ref <150)

## 2023-05-04 LAB — HEMOGLOBIN A1C
Hgb A1c MFr Bld: 5.8 % — ABNORMAL HIGH (ref <5.7)
Mean Plasma Glucose: 120 mg/dL
eAG (mmol/L): 6.6 mmol/L

## 2023-05-04 MED ORDER — ROSUVASTATIN CALCIUM 10 MG PO TABS: 10.0000 mg | ORAL_TABLET | Freq: Every day | ORAL | 3 refills | Status: AC

## 2023-08-02 ENCOUNTER — Encounter: Payer: Self-pay | Admitting: Emergency Medicine

## 2023-08-02 ENCOUNTER — Ambulatory Visit
Admission: EM | Admit: 2023-08-02 | Discharge: 2023-08-02 | Disposition: A | Discharge status: Home / Self Care | Attending: Emergency Medicine | Admitting: Emergency Medicine

## 2023-08-02 DIAGNOSIS — R21 Rash and other nonspecific skin eruption: Secondary | ICD-10-CM | POA: Diagnosis not present

## 2023-08-02 MED ORDER — PREDNISONE 10 MG (21) PO TBPK: ORAL_TABLET | Freq: Every day | ORAL | 0 refills | Status: DC

## 2023-08-02 NOTE — ED Provider Notes (Signed)
 [facility]    CSN: [id number] Arrival date & time: 08/02/23  0932      History   Chief Complaint Chief Complaint  Patient presents with   Rash    HPI Allison Phillips is a 73 y.o. female.   Patient presents for evaluation of a pruritic rash present to the back and the chest beginning 4 days ago.  Initially on the back but spread to her chest this morning.  Thought to be a bug bite by insect unwitnessed.  Concern for shingles.  Denies drainage, fever.  Denies changes in toiletries, diet or recent travel.  No known sick contact with similar symptoms.  Rash has not occurred in the past.  Past Medical History:  Diagnosis Date   Bursitis of left hip    Depression    Depression    Diverticulitis    Emphysema lung (HCC)    Hepatic abscess 01/18/2019   Hip pain, chronic, left 03/18/2017   Hypertension    Joint pain    Lower back pain 03/18/2017   Obesity    OSA (obstructive sleep apnea)    Osteopenia 04/20/2017   April 2019; next DEXA April 2021   Osteoporosis    Prediabetes    Reflux    Sacroiliac joint pain 03/18/2017   Tobacco use     Patient Active Problem List   Diagnosis Date Noted   Hyperlipidemia 05/03/2023   Gastroesophageal reflux disease 05/03/2023   Centrilobular emphysema (HCC) 04/14/2022   OSA (obstructive sleep apnea) 09/23/2020   Aortic atherosclerosis (HCC) 11/14/2019   Severe obesity with body mass index (BMI) of 35.0 to 35.9 and comorbidity (HCC) 01/06/2018   Osteopenia 04/20/2017   Type 2 diabetes mellitus without complication, without long-term current use of insulin (HCC) 03/08/2016   Essential hypertension, benign 03/08/2016   Tobacco abuse     Past Surgical History:  Procedure Laterality Date   ANAL FISSURE REPAIR     APPENDECTOMY     CESAREAN SECTION  1978   COLONOSCOPY WITH PROPOFOL  N/A 12/18/2014   Procedure: COLONOSCOPY WITH PROPOFOL ;  Surgeon: Louanne KANDICE Muse, MD;  Location: [facility];  Service: Endoscopy;   Laterality: N/A;   COLONOSCOPY WITH PROPOFOL  N/A 02/17/2018   Procedure: COLONOSCOPY WITH PROPOFOL ;  Surgeon: Janalyn Keene NOVAK, MD;  Location: [facility];  Service: Endoscopy;  Laterality: N/A;   LIPOMA EXCISION  07/2015   right shoulder   LIPOMA EXCISION  11/19/2015   OOPHORECTOMY     for fibroids not sure which ovary    OB History   No obstetric history on file.      Home Medications    Prior to Admission medications   Medication Sig Start Date End Date Taking? Authorizing Provider  predniSONE  (STERAPRED UNI-PAK 21 TAB) 10 MG (21) TBPK tablet Take by mouth daily. Take 6 tabs by mouth daily  for 1 days, then 5 tabs for 1 days, then 4 tabs for 1 days, then 3 tabs for 1 days, 2 tabs for 1 days, then 1 tab by mouth daily for 1 days 08/02/23  Yes Giovanne Nickolson R, NP  albuterol  (VENTOLIN  HFA) 108 (90 Base) MCG/ACT inhaler Inhale 2 puffs into the lungs every 4 (four) hours as needed for wheezing or shortness of breath. 05/26/22   Leavy Mole, PA-C  aspirin  EC 81 MG tablet Take 1 tablet (81 mg total) by mouth daily. Swallow whole. 03/31/22   Sowles, Krichna, MD  Cyanocobalamin (B-12) 500 MCG TABS Take 2,500 mcg by mouth  daily.     [provider]  Gluc-Chonn-MSM-Boswellia-Vit D (GLUCOSAMINE CHONDROITIN + D3 PO) Take by mouth.    [provider]  meloxicam  (MOBIC ) 15 MG tablet Take 0.5-1 tablets (7.5-15 mg total) by mouth daily. 09/01/22   Pender, Julie F, FNP  Multiple Vitamin (MULTIVITAMIN) tablet Take 1 tablet by mouth daily. Centrum Silver    [provider]  Omega-3 Fatty Acids (FISH OIL PO) Take 1,200 mg daily by mouth.     [provider]  pantoprazole  (PROTONIX ) 40 MG tablet TAKE 1 TABLET(40 MG) BY MOUTH EVERY MORNING 1 HOUR BEFORE BREAKFAST 08/24/22   Tapia, Leisa, PA-C  rosuvastatin  (CRESTOR ) 10 MG tablet Take 1 tablet (10 mg total) by mouth daily. 05/04/23   Tapia, Leisa, PA-C  Semaglutide ,0.25 or 0.5MG /DOS, (OZEMPIC , 0.25 OR 0.5 MG/DOSE,) 2  MG/3ML SOPN Inject 0.5 mg into the skin once a week. 05/03/23   Tapia, Leisa, PA-C  triamcinolone  (KENALOG ) 0.025 % ointment Apply 1 Application topically 2 (two) times daily as needed. 11/02/21   Tapia, Leisa, PA-C  valsartan  (DIOVAN ) 80 MG tablet Take 1 tablet (80 mg total) by mouth daily. 05/03/23   Leavy Mole, PA-C    Family History Family History  Problem Relation Age of Onset   Hypertension Mother    Kidney disease Mother    Alzheimer's disease Mother    Thyroid  cancer Son    Lung cancer Brother    Rectal cancer Brother    Breast cancer Neg Hx     Social History Social History   Tobacco Use   Smoking status: Every Day    Current packs/day: 0.00    Average packs/day: 0.5 packs/day for 15.0 years (7.5 ttl pk-yrs)    Types: Cigarettes    Start date: 10/24/2001    Last attempt to quit: 10/24/2016    Years since quitting: 6.7   Smokeless tobacco: Never   Tobacco comments:    8-10 cigarettes daily- 06/09/2021  Vaping Use   Vaping status: Never Used  Substance Use Topics   Alcohol use: Yes    Alcohol/week: 4.0 standard drinks of alcohol    Types: 4 Standard drinks or equivalent per week    Comment: on occasion   Drug use: No     Allergies   Adhesive [tape]   Review of Systems Review of Systems  Skin:  Positive for rash.     Physical Exam Triage Vital Signs ED Triage Vitals  Encounter Vitals Group     BP 08/02/23 0937 (!) 138/58     Girls Systolic BP Percentile --      Girls Diastolic BP Percentile --      Boys Systolic BP Percentile --      Boys Diastolic BP Percentile --      Pulse Rate 08/02/23 0937 67     Resp 08/02/23 0937 18     Temp 08/02/23 0937 97.8 F (36.6 C)     Temp Source 08/02/23 0937 Oral     SpO2 08/02/23 0937 98 %     Weight --      Height --      Head Circumference --      Peak Flow --      Pain Score 08/02/23 0940 0     Pain Loc --      Pain Education --      Exclude from Growth Chart --    No data found.  Updated Vital  Signs BP (!) 138/58 (BP Location: Left Arm)  Pulse 67   Temp 97.8 F (36.6 C) (Oral)   Resp 18   LMP  (LMP Unknown) Comment: rt ovary removed  SpO2 98%   Visual Acuity Right Eye Distance:   Left Eye Distance:   Bilateral Distance:    Right Eye Near:   Left Eye Near:    Bilateral Near:     Physical Exam Constitutional:      Appearance: Normal appearance.  Eyes:     Extraocular Movements: Extraocular movements intact.  Pulmonary:     Effort: Pulmonary effort is normal.  Skin:    Comments: Erythematous papular rash present to the center of the chest wall and into the center of the shoulder blades crossing the midline  Neurological:     Mental Status: She is alert and oriented to person, place, and time. Mental status is at baseline.      UC Treatments / Results  Labs (all labs ordered are listed, but only abnormal results are displayed) Labs Reviewed - No data to display  EKG   Radiology No results found.  Procedures Procedures (including critical care time)  Medications Ordered in UC Medications - No data to display  Initial Impression / Assessment and Plan / UC Course  I have reviewed the triage vital signs and the nursing notes.  Pertinent labs & imaging results that were available during my care of the patient were reviewed by me and considered in my medical decision making (see chart for details).  Allison Phillips  Appears inflammatory, no signs of infection, low suspicion for shingles as rash is not unilateral, discussed with patient, insect unwitnessed, unsure if caused by insect but no puncture marks noted within the rash, no signs of tissue necrosis, discussed, stable, prescribed oral prednisone  and recommended supportive care for management of pruritus and advised follow-up with urgent care or primary doctor for any persisting symptoms Final Clinical Impressions(s) / UC Diagnoses   Final diagnoses:  Rash and nonspecific skin eruption     Discharge  Instructions      Today you were evaluated for your rash, appears to be inflammatory as this something came in contact with the skin that it does not like, at this time no signs of infection  Low suspicion for shingles rash as the area on the back crosses the midline of the body and shingles is one-sided  Presentation is not consistent with a spider bite, concern would be death of the tissue which would appear discolored to gray or black and cold to the touch due to decreased blood flow which is often not consistent with current presentation  Begin prednisone  every morning with food to reduce the inflammatory process and help with symptoms, this will also help with itching  For itching you may continue use of alcohol anti-itch cream and oral Benadryl, may also try topical Benadryl cream or calamine lotion  Please be mindful that heat will cause the skin to become more inflamed and itchy, if this occurs cool down with a rag  For symptoms that continue to persist or worsen please follow-up for reevaluation   ED Prescriptions     Medication Sig Dispense Auth. Provider   predniSONE  (STERAPRED UNI-PAK 21 TAB) 10 MG (21) TBPK tablet Take by mouth daily. Take 6 tabs by mouth daily  for 1 days, then 5 tabs for 1 days, then 4 tabs for 1 days, then 3 tabs for 1 days, 2 tabs for 1 days, then 1 tab by mouth daily for 1 days 21 tablet  Teresa Shelba SAUNDERS, NP      PDMP not reviewed this encounter.   Teresa Shelba SAUNDERS, NP 08/02/23 1023

## 2023-08-02 NOTE — ED Triage Notes (Signed)
 Patient reports rash to back of neck and upper chest. 4 days. Patient states she is unsure if she was bitten by something. She states Sunday she took Benadryl. Patient reports the areas itch.

## 2023-08-02 NOTE — Discharge Instructions (Signed)
 Today you were evaluated for your rash, appears to be inflammatory as this something came in contact with the skin that it does not like, at this time no signs of infection  Low suspicion for shingles rash as the area on the back crosses the midline of the body and shingles is one-sided  Presentation is not consistent with a spider bite, concern would be death of the tissue which would appear discolored to gray or black and cold to the touch due to decreased blood flow which is often not consistent with current presentation  Begin prednisone  every morning with food to reduce the inflammatory process and help with symptoms, this will also help with itching  For itching you may continue use of alcohol anti-itch cream and oral Benadryl, may also try topical Benadryl cream or calamine lotion  Please be mindful that heat will cause the skin to become more inflamed and itchy, if this occurs cool down with a rag  For symptoms that continue to persist or worsen please follow-up for reevaluation

## 2023-09-12 ENCOUNTER — Telehealth: Payer: Self-pay

## 2023-09-12 DIAGNOSIS — G4733 Obstructive sleep apnea (adult) (pediatric): Secondary | ICD-10-CM

## 2023-09-12 NOTE — Telephone Encounter (Signed)
 Copied from CRM #[id number]. Topic: Clinical - Order For Equipment >> Sep 07, 2023 12:59 PM Corean SAUNDERS wrote: Reason for CRM: Former patient of Dr. Shellia frustrated that she was not alerted of his leaving the practice but did schedule a TOC appointment with Dr. Tamea on 9/25 but patient is inuring about Dr. Tamea ordering her CPAP supplies to [facility].  Please call patient back and advise. >> Sep 12, 2023 12:27 PM Corean SAUNDERS wrote: Patient scheduled soonest available appointment with NP Comer Rouleau OCT 20  but has no CPAP supplies left and is asking the provider to please order supplies for her as she attempted to be seen sooner and is very worried about going without her CPAP.

## 2023-09-12 NOTE — Telephone Encounter (Signed)
 Yes, that's fine to place CPAP supplies renewal order

## 2023-09-12 NOTE — Telephone Encounter (Signed)
Order has been placed and I have notified the patient.  Nothing further needed.

## 2023-09-12 NOTE — Telephone Encounter (Signed)
 Patient is scheduled to see you on 10/20. Are you okay ordering supplies for her CPAP machine? Last appt was with Dr. Shellia on 06/02/2023.

## 2023-09-13 ENCOUNTER — Encounter: Payer: Self-pay | Admitting: Internal Medicine

## 2023-09-14 ENCOUNTER — Ambulatory Visit
Admission: RE | Admit: 2023-09-14 | Discharge: 2023-09-14 | Disposition: A | Discharge status: Home / Self Care | Attending: Internal Medicine | Admitting: Internal Medicine

## 2023-09-14 ENCOUNTER — Ambulatory Visit: Admitting: Anesthesiology

## 2023-09-14 ENCOUNTER — Other Ambulatory Visit: Payer: Self-pay

## 2023-09-14 ENCOUNTER — Encounter: Payer: Self-pay | Admitting: Internal Medicine

## 2023-09-14 ENCOUNTER — Encounter
Admission: RE | Disposition: A | Discharge status: Home / Self Care | Payer: Self-pay | Source: Home / Self Care | Attending: Internal Medicine

## 2023-09-14 DIAGNOSIS — F17210 Nicotine dependence, cigarettes, uncomplicated: Secondary | ICD-10-CM | POA: Insufficient documentation

## 2023-09-14 DIAGNOSIS — I129 Hypertensive chronic kidney disease with stage 1 through stage 4 chronic kidney disease, or unspecified chronic kidney disease: Secondary | ICD-10-CM | POA: Diagnosis not present

## 2023-09-14 DIAGNOSIS — K5730 Diverticulosis of large intestine without perforation or abscess without bleeding: Secondary | ICD-10-CM | POA: Insufficient documentation

## 2023-09-14 DIAGNOSIS — K640 First degree hemorrhoids: Secondary | ICD-10-CM | POA: Diagnosis not present

## 2023-09-14 DIAGNOSIS — Z860101 Personal history of adenomatous and serrated colon polyps: Secondary | ICD-10-CM | POA: Diagnosis present

## 2023-09-14 DIAGNOSIS — Z1211 Encounter for screening for malignant neoplasm of colon: Secondary | ICD-10-CM | POA: Diagnosis not present

## 2023-09-14 DIAGNOSIS — Z79899 Other long term (current) drug therapy: Secondary | ICD-10-CM | POA: Diagnosis not present

## 2023-09-14 DIAGNOSIS — Z7985 Long-term (current) use of injectable non-insulin antidiabetic drugs: Secondary | ICD-10-CM | POA: Diagnosis not present

## 2023-09-14 DIAGNOSIS — K5909 Other constipation: Secondary | ICD-10-CM | POA: Diagnosis not present

## 2023-09-14 DIAGNOSIS — E1122 Type 2 diabetes mellitus with diabetic chronic kidney disease: Secondary | ICD-10-CM | POA: Diagnosis not present

## 2023-09-14 DIAGNOSIS — K219 Gastro-esophageal reflux disease without esophagitis: Secondary | ICD-10-CM | POA: Insufficient documentation

## 2023-09-14 DIAGNOSIS — N189 Chronic kidney disease, unspecified: Secondary | ICD-10-CM | POA: Diagnosis not present

## 2023-09-14 DIAGNOSIS — G4733 Obstructive sleep apnea (adult) (pediatric): Secondary | ICD-10-CM | POA: Insufficient documentation

## 2023-09-14 DIAGNOSIS — Z800 Family history of malignant neoplasm of digestive organs: Secondary | ICD-10-CM | POA: Insufficient documentation

## 2023-09-14 HISTORY — DX: Chronic kidney disease, unspecified: N18.9

## 2023-09-14 HISTORY — DX: Prediabetes: R73.03

## 2023-09-14 HISTORY — PX: COLONOSCOPY: SHX5424

## 2023-09-14 HISTORY — DX: Gastro-esophageal reflux disease without esophagitis: K21.9

## 2023-09-14 SURGERY — COLONOSCOPY
Anesthesia: General

## 2023-09-14 MED ORDER — PROPOFOL 10 MG/ML IV BOLUS
INTRAVENOUS | Status: DC | PRN
  Administered 2023-09-14: 100 mg via INTRAVENOUS

## 2023-09-14 MED ORDER — PROPOFOL 500 MG/50ML IV EMUL
INTRAVENOUS | Status: DC | PRN
  Administered 2023-09-14: 150 ug/kg/min via INTRAVENOUS

## 2023-09-14 MED ORDER — LIDOCAINE HCL (CARDIAC) PF 100 MG/5ML IV SOSY: PREFILLED_SYRINGE | INTRAVENOUS | Status: DC | PRN

## 2023-09-14 MED ORDER — SODIUM CHLORIDE 0.9 % IV SOLN: INTRAVENOUS | Status: DC

## 2023-09-14 MED ORDER — PROPOFOL 1000 MG/100ML IV EMUL
INTRAVENOUS | Status: AC
  Filled 2023-09-14: qty 100

## 2023-09-14 NOTE — Interval H&P Note (Signed)
 History and Physical Interval Note:  09/14/2023 12:14 PM  Allison Phillips  has presented today for surgery, with the diagnosis of Z86.0101 (ICD-10-CM) - Hx of adenomatous colonic polyps.  The various methods of treatment have been discussed with the patient and family. After consideration of risks, benefits and other options for treatment, the patient has consented to  Procedure(s) with comments: COLONOSCOPY (N/A) - On Ozempic  as a surgical intervention.  The patient's history has been reviewed, patient examined, no change in status, stable for surgery.  I have reviewed the patient's chart and labs.  Questions were answered to the patient's satisfaction.     [name], [name]

## 2023-09-14 NOTE — Transfer of Care (Signed)
 Immediate Anesthesia Transfer of Care Note  Patient: Allison Phillips  Procedure(s) Performed: COLONOSCOPY  Patient Location: PACU  Anesthesia Type:General  Level of Consciousness: awake and sedated  Airway & Oxygen Therapy: Patient Spontanous Breathing and Patient connected to nasal cannula oxygen  Post-op Assessment: Report given to RN and Post -op Vital signs reviewed and stable  Post vital signs: Reviewed and stable  Last Vitals:  Vitals Value Taken Time  BP    Temp    Pulse    Resp    SpO2      Last Pain:  Vitals:   09/14/23 1115  TempSrc: Temporal  PainSc: 0-No pain         Complications: No notable events documented.

## 2023-09-14 NOTE — Anesthesia Preprocedure Evaluation (Signed)
 Anesthesia Evaluation  Patient identified by MRN, date of birth, ID band Patient awake    Reviewed: Allergy & Precautions, H&P , NPO status , Patient's Chart, lab work & pertinent test results, reviewed documented beta blocker date and time   Airway Mallampati: II   Neck ROM: full    Dental  (+) Poor Dentition   Pulmonary sleep apnea and Continuous Positive Airway Pressure Ventilation , Current Smoker and Patient abstained from smoking.   Pulmonary exam normal        Cardiovascular Exercise Tolerance: Poor hypertension, On Medications negative cardio ROS Normal cardiovascular exam Rhythm:regular Rate:Normal     Neuro/Psych  PSYCHIATRIC DISORDERS  Depression    negative neurological ROS     GI/Hepatic Neg liver ROS,GERD  ,,  Endo/Other  diabetes    Renal/GU Renal disease  negative genitourinary   Musculoskeletal   Abdominal   Peds  Hematology negative hematology ROS (+)   Anesthesia Other Findings Past Medical History: No date: Bursitis of left hip No date: Chronic kidney disease No date: Depression No date: Depression No date: Diverticulitis No date: Emphysema lung (HCC) No date: GERD (gastroesophageal reflux disease) 01/18/2019: Hepatic abscess 03/18/2017: Hip pain, chronic, left No date: Hypertension No date: Joint pain 03/18/2017: Lower back pain No date: Obesity No date: OSA (obstructive sleep apnea) 04/20/2017: Osteopenia     Comment:  April 2019; next DEXA April 2021 No date: Osteoporosis No date: Pre-diabetes No date: Prediabetes No date: Reflux 03/18/2017: Sacroiliac joint pain No date: Tobacco use Past Surgical History: No date: ANAL FISSURE REPAIR No date: APPENDECTOMY 1978: CESAREAN SECTION 12/18/2014: COLONOSCOPY WITH PROPOFOL ; N/A     Comment:  Procedure: COLONOSCOPY WITH PROPOFOL ;  Surgeon:               Louanne KANDICE Muse, MD;  Location: [facility] ENDOSCOPY;                Service:  Endoscopy;  Laterality: N/A; 02/17/2018: COLONOSCOPY WITH PROPOFOL ; N/A     Comment:  Procedure: COLONOSCOPY WITH PROPOFOL ;  Surgeon:               Janalyn Keene NOVAK, MD;  Location: [facility] ENDOSCOPY;                Service: Endoscopy;  Laterality: N/A; 07/2015: LIPOMA EXCISION     Comment:  right shoulder 11/19/2015: LIPOMA EXCISION No date: OOPHORECTOMY     Comment:  for fibroids not sure which ovary BMI    Body Mass Index: 34.07 kg/m     Reproductive/Obstetrics negative OB ROS                              Anesthesia Physical Anesthesia Plan  ASA: 3  Anesthesia Plan: General   Post-op Pain Management:    Induction:   PONV Risk Score and Plan:   Airway Management Planned:   Additional Equipment:   Intra-op Plan:   Post-operative Plan:   Informed Consent: I have reviewed the patients History and Physical, chart, labs and discussed the procedure including the risks, benefits and alternatives for the proposed anesthesia with the patient or authorized representative who has indicated his/her understanding and acceptance.     Dental Advisory Given  Plan Discussed with: CRNA  Anesthesia Plan Comments:         Anesthesia Quick Evaluation

## 2023-09-14 NOTE — Op Note (Signed)
 [facility]astroenter[facility] Patient Name: Chayna Surratt Procedure Date: 09/14/2023 12:50 PM MRN: [id number] Account #: [id number] Date of Birth: 06/01/1950 Admit Type: Outpatient Age: 73 Room: [facility] ENDO ROOM 2 Gender: Female Note Status: Finalized Instrument Name: Colon Scope [id number] Procedure:             Colonoscopy Indications:           Surveillance: Personal history of adenomatous polyps                         on last colonoscopy > 5 years ago Providers:             Jamel Holzmann K. Aundria MD, MD Referring MD:          Wirt Hemmerich K. Aundria MD, MD (Referring MD) Medicines:             Propofol  per Anesthesia Complications:         No immediate complications. Estimated blood loss: None. Procedure:             Pre-Anesthesia Assessment:                        - The risks and benefits of the procedure and the                         sedation options and risks were discussed with the                         patient. All questions were answered and informed                         consent was obtained.                        - Patient identification and proposed procedure were                         verified prior to the procedure by the nurse. The                         procedure was verified in the procedure room.                        - ASA Grade Assessment: III - A patient with severe                         systemic disease.                        - After reviewing the risks and benefits, the patient                         was deemed in satisfactory condition to undergo the                         procedure.                        After obtaining informed consent, the colonoscope was  passed under direct vision. Throughout the procedure,                         the patient's blood pressure, pulse, and oxygen                         saturations were monitored continuously. The                         Colonoscope was introduced through the  anus and                         advanced to the the cecum, identified by appendiceal                         orifice and ileocecal valve. The colonoscopy was                         performed without difficulty. The patient tolerated                         the procedure well. The quality of the bowel                         preparation was good. The ileocecal valve, appendiceal                         orifice, and rectum were photographed. Findings:      The perianal and digital rectal examinations were normal. Pertinent       negatives include normal sphincter tone and no palpable rectal lesions.      Non-bleeding internal hemorrhoids were found during retroflexion. The       hemorrhoids were Grade I (internal hemorrhoids that do not prolapse).      Many small-mouthed diverticula were found in the sigmoid colon.      A tattoo was seen in the sigmoid colon. The tattoo site appeared normal.       Estimated blood loss: none.      The exam was otherwise without abnormality. Impression:            - Non-bleeding internal hemorrhoids.                        - Diverticulosis in the sigmoid colon.                        - A tattoo was seen in the sigmoid colon. The tattoo                         site appeared normal.                        - The examination was otherwise normal.                        - No specimens collected. Recommendation:        - Patient has a contact number available for                         emergencies.  The signs and symptoms of potential                         delayed complications were discussed with the patient.                         Return to normal activities tomorrow. Written                         discharge instructions were provided to the patient.                        - Resume previous diet.                        - Continue present medications.                        - You do NOT require further colon cancer screening                          measures (Annual stool testing (i.e. hemoccult, FIT,                         cologuard), sigmoidoscopy, colonoscopy or CT                         colonography). You should share this recommendation                         with your Primary Care provider.                        - Return to GI office PRN. Procedure Code(s):     --- Professional ---                        H9894, Colorectal cancer screening; colonoscopy on                         individual at high risk Diagnosis Code(s):     --- Professional ---                        K57.30, Diverticulosis of large intestine without                         perforation or abscess without bleeding                        K64.0, First degree hemorrhoids                        Z86.010, Personal history of colonic polyps CPT copyright 2022 American Medical Association. All rights reserved. The codes documented in this report are preliminary and upon coder review may  be revised to meet current compliance requirements. Ladell MARLA Boss MD, MD 09/14/2023 1:15:22 PM This report has been signed electronically. Number of Addenda: 0 Note Initiated On: 09/14/2023 12:50 PM Scope Withdrawal Time: 0 hours 6 minutes 51 seconds  Total Procedure Duration: 0 hours 11 minutes 31 seconds  Estimated Blood Loss:  Estimated blood loss: none. Estimated blood loss: none.      [facility]

## 2023-09-14 NOTE — H&P (Signed)
 Outpatient short stay form Pre-procedure 09/14/2023 12:13 PM Gavriela Cashin K. Aundria, M.D.  Primary Physician: Michelene Cower, PA-C  Reason for visit:  History of adenomatous colon polyps  History of present illness:  Ms. Quant presents to the [facility] GI clinic at the request of Leisa Tapia, PA-C, at [facility] for chief complaint of high-risk colon cancer surveillance 2/2 personal hx of adenomatous colon polyps. Last colonoscopy performed by Dr. Janalyn Jan 2020 removed two subcentimeter tubular adenomas and one subcentimeter hyperplastic polyp. She has a family history of rectal cancer in her brother diagnosed at age 71. She has noticed some issues with chronic constipation since being placed on Ozempic  about a year ago. Her PCP increased her dose to 0.5 mg weekly couple months ago and this seemed to make the constipation worse. She uses prune juice and prunes and OTC female laxative to help her bowels move. She reports since starting the prune juice that her constipation seems to be much better. She is having a BM daily at this time. She denies any issues with hematochezia or melena. She denies any complaints of abdominal pain or abdominal cramping. Appetite and diet are stable without any unintentional weight loss. She has no longer had to take Protonix  for GERD symptoms. These symptoms are much improved since avoiding her dietary triggers and trying her best not to overeat. She denies any complaints of esophageal dysphagia, odynophagia, early satiety, hoarseness, or epigastric abdominal pain. She is still smoking 8-10 cigarettes a day.     Current Facility-Administered Medications:    0.9 %  sodium chloride  infusion, , Intravenous, Continuous, [place], Remmie Bembenek K, MD, Last Rate: 20 mL/hr at 09/14/23 1157, Continued from Pre-op at 09/14/23 1157  Medications Prior to Admission  Medication Sig Dispense Refill Last Dose/Taking   aspirin  EC 81 MG tablet Take 1 tablet (81 mg  total) by mouth daily. Swallow whole. 100 tablet 1 Past Week   Cyanocobalamin (B-12) 500 MCG TABS Take 2,500 mcg by mouth daily.    Past Week   Gluc-Chonn-MSM-Boswellia-Vit D (GLUCOSAMINE CHONDROITIN + D3 PO) Take by mouth.   Past Week   meloxicam  (MOBIC ) 15 MG tablet Take 0.5-1 tablets (7.5-15 mg total) by mouth daily. 90 tablet 1 Past Week   Multiple Vitamin (MULTIVITAMIN) tablet Take 1 tablet by mouth daily. Centrum Silver   Past Week   Omega-3 Fatty Acids (FISH OIL PO) Take 1,200 mg daily by mouth.    Past Week   pantoprazole  (PROTONIX ) 40 MG tablet TAKE 1 TABLET(40 MG) BY MOUTH EVERY MORNING 1 HOUR BEFORE BREAKFAST 90 tablet 0 Past Week   rosuvastatin  (CRESTOR ) 10 MG tablet Take 1 tablet (10 mg total) by mouth daily. 90 tablet 3 09/13/2023   Semaglutide ,0.25 or 0.5MG /DOS, (OZEMPIC , 0.25 OR 0.5 MG/DOSE,) 2 MG/3ML SOPN Inject 0.5 mg into the skin once a week. 9 mL 1 Past Week   Turmeric (QC TUMERIC COMPLEX PO) Take by mouth.   Past Week   valsartan  (DIOVAN ) 80 MG tablet Take 1 tablet (80 mg total) by mouth daily. 90 tablet 1 Past Week   vitamin E 180 MG (400 UNITS) capsule Take 400 Units by mouth daily.   Past Week   albuterol  (VENTOLIN  HFA) 108 (90 Base) MCG/ACT inhaler Inhale 2 puffs into the lungs every 4 (four) hours as needed for wheezing or shortness of breath. 1 each 4    predniSONE  (STERAPRED UNI-PAK 21 TAB) 10 MG (21) TBPK tablet Take by mouth daily. Take 6 tabs by mouth  daily  for 1 days, then 5 tabs for 1 days, then 4 tabs for 1 days, then 3 tabs for 1 days, 2 tabs for 1 days, then 1 tab by mouth daily for 1 days 21 tablet 0    triamcinolone  (KENALOG ) 0.025 % ointment Apply 1 Application topically 2 (two) times daily as needed. 30 g 0      Allergies  Allergen Reactions   Adhesive [Tape]     Rash/dermatitis   Latex Rash     Past Medical History:  Diagnosis Date   Bursitis of left hip    Chronic kidney disease    Depression    Depression    Diverticulitis    Emphysema lung  (HCC)    GERD (gastroesophageal reflux disease)    Hepatic abscess 01/18/2019   Hip pain, chronic, left 03/18/2017   Hypertension    Joint pain    Lower back pain 03/18/2017   Obesity    OSA (obstructive sleep apnea)    Osteopenia 04/20/2017   April 2019; next DEXA April 2021   Osteoporosis    Pre-diabetes    Prediabetes    Reflux    Sacroiliac joint pain 03/18/2017   Tobacco use     Review of systems:  Otherwise negative.    Physical Exam  Gen: Alert, oriented. Appears stated age.  HEENT: [place]/AT. PERRLA. Lungs: CTA, no wheezes. CV: RR nl S1, S2. Abd: soft, benign, no masses. BS+ Ext: No edema. Pulses 2+    Planned procedures: Proceed with colonoscopy. The patient understands the nature of the planned procedure, indications, risks, alternatives and potential complications including but not limited to bleeding, infection, perforation, damage to internal organs and possible oversedation/side effects from anesthesia. The patient agrees and gives consent to proceed.  Please refer to procedure notes for findings, recommendations and patient disposition/instructions.     Eleisha Branscomb K. Aundria, M.D. Gastroenterology 09/14/2023  12:13 PM

## 2023-09-14 NOTE — Anesthesia Postprocedure Evaluation (Signed)
 Anesthesia Post Note  Patient: Allison Phillips  Procedure(s) Performed: COLONOSCOPY  Patient location during evaluation: PACU Anesthesia Type: General Level of consciousness: awake and alert Pain management: pain level controlled Vital Signs Assessment: post-procedure vital signs reviewed and stable Respiratory status: spontaneous breathing, nonlabored ventilation, respiratory function stable and patient connected to nasal cannula oxygen Cardiovascular status: blood pressure returned to baseline and stable Postop Assessment: no apparent nausea or vomiting Anesthetic complications: no   No notable events documented.   Last Vitals:  Vitals:   09/14/23 1334 09/14/23 1344  BP: 123/69 114/75  Pulse: (!) 52 (!) 56  Resp: (!) 9 14  Temp:    SpO2: (!) 87% 100%    Last Pain:  Vitals:   09/14/23 1344  TempSrc:   PainSc: 0-No pain                 [place] KANDICE Clause

## 2023-09-15 ENCOUNTER — Ambulatory Visit: Payer: Medicare Other

## 2023-09-15 DIAGNOSIS — Z0000 Encounter for general adult medical examination without abnormal findings: Secondary | ICD-10-CM | POA: Diagnosis not present

## 2023-09-15 NOTE — Progress Notes (Signed)
 Subjective:   Allison Phillips is a 73 y.o. who presents for a Medicare Wellness preventive visit.  As a reminder, Annual Wellness Visits don't include a physical exam, and some assessments may be limited, especially if this visit is performed virtually. We may recommend an in-person follow-up visit with your provider if needed.  Visit Complete: Virtual I connected with  Allison Phillips on 09/15/23 by a audio enabled telemedicine application and verified that I am speaking with the correct person using two identifiers.  Patient Location: Home  Provider Location: Home Office  I discussed the limitations of evaluation and management by telemedicine. The patient expressed understanding and agreed to proceed.  Vital Signs: Because this visit was a virtual/telehealth visit, some criteria may be missing or patient reported. Any vitals not documented were not able to be obtained and vitals that have been documented are patient reported.  VideoDeclined- This patient declined Librarian, academic. Therefore the visit was completed with audio only.  Persons Participating in Visit: Patient.  AWV Questionnaire: No: Patient Medicare AWV questionnaire was not completed prior to this visit.  Cardiac Risk Factors include: advanced age (>67men, >72 women);dyslipidemia;hypertension;sedentary lifestyle;obesity (BMI >30kg/m2);smoking/ tobacco exposure     Objective:    There were no vitals filed for this visit. There is no height or weight on file to calculate BMI.     09/15/2023   10:59 AM 09/14/2023   11:14 AM 09/09/2022   10:00 AM 07/17/2020    9:25 AM 07/17/2019    9:18 AM 01/18/2019    6:46 PM 01/18/2019   10:46 AM  Advanced Directives  Does Patient Have a Medical Advance Directive? Yes Yes Yes No No No No  Type of Advance Directive Living will Living will Healthcare Power of [place];Living will      Does patient want to make changes to medical advance directive?  No - Patient declined        Would patient like information on creating a medical advance directive?    No - Patient declined No - Patient declined No - Patient declined No - Patient declined    Current Medications (verified) Outpatient Encounter Medications as of 09/15/2023  Medication Sig   albuterol  (VENTOLIN  HFA) 108 (90 Base) MCG/ACT inhaler Inhale 2 puffs into the lungs every 4 (four) hours as needed for wheezing or shortness of breath.   aspirin  EC 81 MG tablet Take 1 tablet (81 mg total) by mouth daily. Swallow whole.   Cyanocobalamin (B-12) 500 MCG TABS Take 2,500 mcg by mouth daily.    Gluc-Chonn-MSM-Boswellia-Vit D (GLUCOSAMINE CHONDROITIN + D3 PO) Take by mouth.   Multiple Vitamin (MULTIVITAMIN) tablet Take 1 tablet by mouth daily. Centrum Silver   Omega-3 Fatty Acids (FISH OIL PO) Take 1,200 mg daily by mouth.    rosuvastatin  (CRESTOR ) 10 MG tablet Take 1 tablet (10 mg total) by mouth daily.   Semaglutide ,0.25 or 0.5MG /DOS, (OZEMPIC , 0.25 OR 0.5 MG/DOSE,) 2 MG/3ML SOPN Inject 0.5 mg into the skin once a week.   triamcinolone  (KENALOG ) 0.025 % ointment Apply 1 Application topically 2 (two) times daily as needed.   valsartan  (DIOVAN ) 80 MG tablet Take 1 tablet (80 mg total) by mouth daily.   meloxicam  (MOBIC ) 15 MG tablet Take 0.5-1 tablets (7.5-15 mg total) by mouth daily. (Patient not taking: Reported on 09/15/2023)   pantoprazole  (PROTONIX ) 40 MG tablet TAKE 1 TABLET(40 MG) BY MOUTH EVERY MORNING 1 HOUR BEFORE BREAKFAST (Patient not taking: Reported on 09/15/2023)  predniSONE  (STERAPRED UNI-PAK 21 TAB) 10 MG (21) TBPK tablet Take by mouth daily. Take 6 tabs by mouth daily  for 1 days, then 5 tabs for 1 days, then 4 tabs for 1 days, then 3 tabs for 1 days, 2 tabs for 1 days, then 1 tab by mouth daily for 1 days (Patient not taking: Reported on 09/15/2023)   Turmeric (QC TUMERIC COMPLEX PO) Take by mouth. (Patient not taking: Reported on 09/15/2023)   vitamin E 180 MG (400 UNITS) capsule  Take 400 Units by mouth daily. (Patient not taking: Reported on 09/15/2023)   No facility-administered encounter medications on file as of 09/15/2023.    Allergies (verified) Adhesive [tape] and Latex   History: Past Medical History:  Diagnosis Date   Bursitis of left hip    Chronic kidney disease    Depression    Depression    Diverticulitis    Emphysema lung (HCC)    GERD (gastroesophageal reflux disease)    Hepatic abscess 01/18/2019   Hip pain, chronic, left 03/18/2017   Hypertension    Joint pain    Lower back pain 03/18/2017   Obesity    OSA (obstructive sleep apnea)    Osteopenia 04/20/2017   April 2019; next DEXA April 2021   Osteoporosis    Pre-diabetes    Prediabetes    Reflux    Sacroiliac joint pain 03/18/2017   Tobacco use    Past Surgical History:  Procedure Laterality Date   ANAL FISSURE REPAIR     APPENDECTOMY     CESAREAN SECTION  1978   COLONOSCOPY N/A 09/14/2023   Procedure: COLONOSCOPY;  Surgeon: Toledo, Ladell POUR, MD;  Location: [facility];  Service: Gastroenterology;  Laterality: N/A;  On Ozempic    COLONOSCOPY WITH PROPOFOL  N/A 12/18/2014   Procedure: COLONOSCOPY WITH PROPOFOL ;  Surgeon: Louanne KANDICE Muse, MD;  Location: [facility];  Service: Endoscopy;  Laterality: N/A;   COLONOSCOPY WITH PROPOFOL  N/A 02/17/2018   Procedure: COLONOSCOPY WITH PROPOFOL ;  Surgeon: Janalyn Keene NOVAK, MD;  Location: [facility];  Service: Endoscopy;  Laterality: N/A;   LIPOMA EXCISION  07/2015   right shoulder   LIPOMA EXCISION  11/19/2015   OOPHORECTOMY     for fibroids not sure which ovary   Family History  Problem Relation Age of Onset   Hypertension Mother    Kidney disease Mother    Alzheimer's disease Mother    Thyroid  cancer Son    Lung cancer Brother    Rectal cancer Brother    Breast cancer Neg Hx    Social History   Socioeconomic History   Marital status: Married    Spouse name: Kayla   Number of children: 1   Years of  education: Not on file   Highest education level: Bachelor's degree (e.g., BA, AB, BS)  Occupational History   Occupation: Retired  Tobacco Use   Smoking status: Every Day    Current packs/day: 0.00    Average packs/day: 0.5 packs/day for 15.0 years (7.5 ttl pk-yrs)    Types: Cigarettes    Start date: 10/24/2001    Last attempt to quit: 10/24/2016    Years since quitting: 6.8   Smokeless tobacco: Never   Tobacco comments:    8-10 cigarettes daily- 06/09/2021  Vaping Use   Vaping status: Never Used  Substance and Sexual Activity   Alcohol use: Yes    Alcohol/week: 4.0 standard drinks of alcohol    Types: 4 Standard drinks or equivalent per week  Comment: on occasion   Drug use: No   Sexual activity: Yes    Partners: Male  Other Topics Concern   Not on file  Social History Narrative   Not on file   Social Drivers of Health   Financial Resource Strain: Low Risk  (09/15/2023)   Overall Financial Resource Strain (CARDIA)    Difficulty of Paying Living Expenses: Not hard at all  Food Insecurity: No Food Insecurity (09/15/2023)   Hunger Vital Sign    Worried About Running Out of Food in the Last Year: Never true    Ran Out of Food in the Last Year: Never true  Transportation Needs: No Transportation Needs (09/15/2023)   PRAPARE - Administrator, Civil Service (Medical): No    Lack of Transportation (Non-Medical): No  Physical Activity: Inactive (09/15/2023)   Exercise Vital Sign    Days of Exercise per Week: 0 days    Minutes of Exercise per Session: 0 min  Stress: No Stress Concern Present (09/15/2023)   [employer] of Occupational Health - Occupational Stress Questionnaire    Feeling of Stress: Not at all  Social Connections: Socially Integrated (09/15/2023)   Social Connection and Isolation Panel    Frequency of Communication with Friends and Family: More than three times a week    Frequency of Social Gatherings with Friends and Family: Once a week     Attends Religious Services: More than 4 times per year    Active Member of [employer] or Organizations: Yes    Attends Engineer, structural: More than 4 times per year    Marital Status: Married    Tobacco Counseling Ready to quit: Not Answered Counseling given: Not Answered Tobacco comments: 8-10 cigarettes daily- 06/09/2021    Clinical Intake:  Pre-visit preparation completed: Yes  Pain : No/denies pain     BMI - recorded: 35 Nutritional Status: BMI > 30  Obese Nutritional Risks: None Diabetes: No  Lab Results  Component Value Date   HGBA1C 5.8 (H) 05/03/2023   HGBA1C 6.6 04/12/2023   HGBA1C 6.1 (H) 10/25/2022     How often do you need to have someone help you when you read instructions, pamphlets, or other written materials from your doctor or pharmacy?: 1 - Never  Interpreter Needed?: No  Information entered by :: JHONNIE DAS, LPN   Activities of Daily Living    09/15/2023   11:00 AM 11/01/2022    8:47 AM  In your present state of health, do you have any difficulty performing the following activities:  Hearing? 0 0  Vision? 0 0  Difficulty concentrating or making decisions? 0 0  Walking or climbing stairs? 0 1  Comment AS LONG AS THERE IS A RAIL   Dressing or bathing? 0 0  Doing errands, shopping? 0 0  Preparing Food and eating ? N   Using the Toilet? N   In the past six months, have you accidently leaked urine? N   Do you have problems with loss of bowel control? N   Managing your Medications? N   Managing your Finances? N   Housekeeping or managing your Housekeeping? N     Patient Care Team: Leavy Mole, PA-C as PCP - General (Family Medicine) Kathlynn Sharper, MD as Consulting Physician (Orthopedic Surgery) Gaines, Thomas C, PA-C as Physician Assistant (Orthopedic Surgery)  I have updated your Care Teams any recent Medical Services you may have received from other providers in the past year.  Assessment:   This is a routine wellness  examination for Allison Phillips.  Hearing/Vision screen Hearing Screening - Comments:: NO AIDS Vision Screening - Comments:: WEARS GLASSES ALL DAY- MY EYE DOCTOR IN [place]   Goals Addressed             This Visit's Progress    DIET - EAT MORE FRUITS AND VEGETABLES         Depression Screen     09/15/2023   10:57 AM 11/01/2022    8:47 AM 10/25/2022    3:22 PM 09/09/2022    9:58 AM 09/01/2022   11:44 AM 07/23/2022    9:51 AM 05/26/2022    9:44 AM  PHQ 2/9 Scores  PHQ - 2 Score 0 0 0 0 0 0 0  PHQ- 9 Score 0 0 0   0 0    Fall Risk     09/15/2023   10:59 AM 11/01/2022    8:47 AM 10/25/2022    3:17 PM 09/09/2022    9:54 AM 09/01/2022   11:44 AM  Fall Risk   Falls in the past year? 1 0 0 0 0  Number falls in past yr: 0 0 0 0 0  Injury with Fall? 1 0 0 0 0  Comment SPRAINED ANKLE      Risk for fall due to :  No Fall Risks  No Fall Risks   Follow up Falls evaluation completed;Falls prevention discussed Falls prevention discussed;Education provided;Falls evaluation completed  Education provided;Falls prevention discussed     MEDICARE RISK AT HOME:  Medicare Risk at Home Any stairs in or around the home?: No If so, are there any without handrails?: No Home free of loose throw rugs in walkways, pet beds, electrical cords, etc?: Yes Adequate lighting in your home to reduce risk of falls?: Yes Life alert?: No Use of a cane, walker or w/c?: No Grab bars in the bathroom?: Yes Shower chair or bench in shower?: No Elevated toilet seat or a handicapped toilet?: Yes  TIMED UP AND GO:  Was the test performed?  No  Cognitive Function: 6CIT completed        09/15/2023   11:02 AM 09/09/2022   10:03 AM 07/29/2021    8:26 AM 12/06/2016    3:49 PM  6CIT Screen  What Year? 0 points 0 points 0 points 0 points  What month? 0 points 0 points 0 points 0 points  What time? 0 points 0 points 0 points 0 points  Count back from 20 0 points 0 points 0 points 0 points  Months in reverse 0 points  0 points 0 points 0 points  Repeat phrase 0 points 0 points 0 points 0 points  Total Score 0 points 0 points 0 points 0 points    Immunizations Immunization History  Administered Date(s) Administered   Fluad Quad(high Dose 65+) 10/19/2018, 10/08/2019, 10/08/2020   INFLUENZA, HIGH DOSE SEASONAL PF 11/14/2017, 10/15/2021   Moderna Covid-19 Vaccine Bivalent Booster 36yrs & up 10/27/2020   Moderna SARS-COV2 Booster Vaccination 10/26/2021   Moderna Sars-Covid-2 Vaccination 03/02/2019, 04/02/2019, 11/13/2019, 04/28/2020   Pneumococcal Conjugate-13 12/06/2016   Pneumococcal Polysaccharide-23 11/02/2010, 08/28/2018, 01/19/2019   RSV,unspecified 12/21/2021   Td 10/23/2007   Zoster Recombinant(Shingrix) 10/15/2021, 12/21/2021   Zoster, Live 11/16/2011    Screening Tests Health Maintenance  Topic Date Due   OPHTHALMOLOGY EXAM  06/01/2018   COVID-19 Vaccine (7 - 2024-25 season) 09/19/2022   INFLUENZA VACCINE  08/19/2023   HEMOGLOBIN A1C  11/02/2023  MAMMOGRAM  04/03/2024   Diabetic kidney evaluation - eGFR measurement  05/02/2024   Diabetic kidney evaluation - Urine ACR  05/02/2024   FOOT EXAM  05/02/2024   Medicare Annual Wellness (AWV)  09/14/2024   DEXA SCAN  11/15/2024   Colonoscopy  09/13/2028   Pneumococcal Vaccine: 50+ Years  Completed   Hepatitis C Screening  Completed   Zoster Vaccines- Shingrix  Completed   HPV VACCINES  Aged Out   Meningococcal B Vaccine  Aged Out   DTaP/Tdap/Td  Discontinued    Health Maintenance  Health Maintenance Due  Topic Date Due   OPHTHALMOLOGY EXAM  06/01/2018   COVID-19 Vaccine (7 - 2024-25 season) 09/19/2022   INFLUENZA VACCINE  08/19/2023   Health Maintenance Items Addressed: UP TO DATE ON SHOTS EXCEPT COVD & TDAP; UP TO DATE ON MAMMOGRAM, BDS, & COLONOSCOPY  Additional Screening:  Vision Screening: Recommended annual ophthalmology exams for early detection of glaucoma and other disorders of the eye. Would you like a referral to an  eye doctor? No    Dental Screening: Recommended annual dental exams for proper oral hygiene  Community Resource Referral / Chronic Care Management: CRR required this visit?  No   CCM required this visit?  No   Plan:    I have personally reviewed and noted the following in the patient's chart:   Medical and social history Use of alcohol, tobacco or illicit drugs  Current medications and supplements including opioid prescriptions. Patient is not currently taking opioid prescriptions. Functional ability and status Nutritional status Physical activity Advanced directives List of other physicians Hospitalizations, surgeries, and ER visits in previous 12 months Vitals Screenings to include cognitive, depression, and falls Referrals and appointments  In addition, I have reviewed and discussed with patient certain preventive protocols, quality metrics, and best practice recommendations. A written personalized care plan for preventive services as well as general preventive health recommendations were provided to patient.   Jhonnie GORMAN Das, LPN   1/[id number]/7974   After Visit Summary: (MyChart) Due to this being a telephonic visit, the after visit summary with patients personalized plan was offered to patient via MyChart   Notes: Nothing significant to report at this time.

## 2023-09-15 NOTE — Patient Instructions (Signed)
 Ms. Allison Phillips , Thank you for taking time out of your busy schedule to complete your Annual Wellness Visit with me. I enjoyed our conversation and look forward to speaking with you again next year. I, as well as your care team,  appreciate your ongoing commitment to your health goals. Please review the following plan we discussed and let me know if I can assist you in the future.  Follow up Visits: 09/20/24 @ 10:10 AM BY PHONE We will see or speak with you next year for your Next Medicare AWV with our clinical staff Have you seen your provider in the last 6 months (3 months if uncontrolled diabetes)? Yes  Clinician Recommendations:  Aim for 30 minutes of exercise or brisk walking, 6-8 glasses of water, and 5 servings of fruits and vegetables each day. TAKE CARE!      This is a list of the screenings recommended for you:  Health Maintenance  Topic Date Due   Eye exam for diabetics  06/01/2018   COVID-19 Vaccine (7 - 2024-25 season) 09/19/2022   Flu Shot  08/19/2023   Hemoglobin A1C  11/02/2023   Mammogram  04/03/2024   Yearly kidney function blood test for diabetes  05/02/2024   Yearly kidney health urinalysis for diabetes  05/02/2024   Complete foot exam   05/02/2024   Medicare Annual Wellness Visit  09/14/2024   DEXA scan (bone density measurement)  11/15/2024   Colon Cancer Screening  09/13/2028   Pneumococcal Vaccine for age over 60  Completed   Hepatitis C Screening  Completed   Zoster (Shingles) Vaccine  Completed   HPV Vaccine  Aged Out   Meningitis B Vaccine  Aged Out   DTaP/Tdap/Td vaccine  Discontinued    Advanced directives: (ACP Link)Information on Advanced Care Planning can be found at [place]  Secretary of [facility] Advance Health Care Directives Advance Health Care Directives. [email or web address]  Advance Care Planning is important because it:  [x]  Makes sure you receive the medical care that is consistent with your values, goals, and preferences  [x]  It provides guidance to  your family and loved ones and reduces their decisional burden about whether or not they are making the right decisions based on your wishes.  Follow the link provided in your after visit summary or read over the paperwork we have mailed to you to help you started getting your Advance Directives in place. If you need assistance in completing these, please reach out to us  so that we can help you!

## 2023-09-28 ENCOUNTER — Ambulatory Visit: Payer: Self-pay

## 2023-09-28 NOTE — Telephone Encounter (Signed)
 Copied from CRM #[id number]. Topic: Clinical - Order For Equipment >> Sep 28, 2023  3:45 PM Essie A wrote: Reason for CRM: Deward from [facility].  Need to  have a copy of the standard order and the detailed written order for CPAP supplies.  Please fax to [phone].  Phone is [phone].

## 2023-10-03 ENCOUNTER — Telehealth: Payer: Self-pay

## 2023-10-03 NOTE — Telephone Encounter (Signed)
 Copied from CRM [id number]. Topic: General - Other >> Oct 03, 2023 10:21 AM Shona S wrote: Reason for CRM: patient calling because [facility] called her and told her they cancelled her CPAP supplies order because they can't get in contact with us . She urgently needs this order. Please call patient with update.

## 2023-10-03 NOTE — Telephone Encounter (Signed)
 Duplicate

## 2023-10-03 NOTE — Telephone Encounter (Signed)
 Called [facility] and found they had an incorrect fax #. Fax has been placed in [employer] at [employer]. To be signed and faxed back.

## 2023-10-04 NOTE — Telephone Encounter (Signed)
 Allison Phillips, Allison Phillips MM   10/03/23  2:59 PM Note Called [facility] and found they had an incorrect fax #. Fax has been placed in [employer] at [employer]. To be signed and faxed back.

## 2023-10-04 NOTE — Telephone Encounter (Signed)
 See encounter from 9/10.  Closing this encounter.

## 2023-10-04 NOTE — Telephone Encounter (Addendum)
 Patient is requesting forms be faxed back to [facility], ASAP. Patient reports she has been waiting on CPAP supplies since May.

## 2023-10-05 NOTE — Telephone Encounter (Signed)
 I did not have anything in my box that they found yesterday or Monday. I'm out of the office for the rest of the week; will be back Tuesday. If they can fax it to [place] office, I can come by and sign it.

## 2023-10-07 NOTE — Telephone Encounter (Signed)
 Allison Rouleau, NP signed form for CPAP supplies. And was faxed to [facility]. Spoke to [employer] at [employer], he confirmed receipt of fax. They have processed order. And they will contact patient by phone and email. Left detailed message for patient advising confirmed orders. NFN.

## 2023-10-12 ENCOUNTER — Ambulatory Visit: Payer: Self-pay

## 2023-10-12 NOTE — Telephone Encounter (Signed)
 Copied from CRM [id number]. Topic: Clinical - Medical Advice >> Oct 12, 2023  1:34 PM Sophia H wrote: Reason for CRM: Patient is requesting a call back from nurse, states she has a stye on her right eye. She has never had one before and is wanting to know if she should be seen in clinic for this or if something can be called in on her behalf. States it does burn and itch a little making her uncomfortable. # [phone]   [id number] - [place],  - [address] AT [facility] OF [address] [facility]

## 2023-10-12 NOTE — Telephone Encounter (Signed)
 FYI Only or Action Required?: FYI only for provider.  Patient was last seen in primary care on 05/03/2023 by Allison Mole, PA-C.  Called Nurse Triage reporting Stye.  Symptoms began yesterday.  Interventions attempted: Nothing.  Symptoms are: gradually worsening.  Triage Disposition: See Physician Within 24 Hours  Patient/caregiver understands and will follow disposition?: Yes  Reason for Disposition  [1] Eyelid is very swollen AND [2] no fever  Answer Assessment - Initial Assessment Questions 1. LOCATION: Which eye has the sty? Upper or lower eyelid?     Right upper lashline near inner corner  2. SIZE: How big is it? (Note: standard pencil eraser is 6 mm)     Swollen, pencil tip size  3. EYELID: Is the eyelid swollen? If Yes, ask: How much?     Upper lashline is swollen  4. REDNESS: Has the redness spread onto the eyelid?     Yes  5. ONSET: When did you notice the sty?     Yesterday  6. VISION: Do you have blurred vision?      Feels scratchy  7. PAIN: Is it painful? If Yes, ask: How bad is the pain?  (Scale 1-10; or mild, moderate, severe)     Very sore, worse when blinking  8. CONTACTS: Do you wear contacts?     Denies  Protocols used: Sty-A-AH

## 2023-10-13 ENCOUNTER — Encounter: Admitting: Pulmonary Disease

## 2023-10-13 ENCOUNTER — Encounter: Payer: Self-pay | Admitting: Nurse Practitioner

## 2023-10-13 ENCOUNTER — Ambulatory Visit (INDEPENDENT_AMBULATORY_CARE_PROVIDER_SITE_OTHER): Admitting: Nurse Practitioner

## 2023-10-13 VITALS — BP 112/66 | HR 70 | Temp 97.8°F | Resp 18 | Ht 65.0 in | Wt 211.6 lb

## 2023-10-13 DIAGNOSIS — H01001 Unspecified blepharitis right upper eyelid: Secondary | ICD-10-CM | POA: Diagnosis not present

## 2023-10-13 MED ORDER — ERYTHROMYCIN 5 MG/GM OP OINT: 1.0000 | TOPICAL_OINTMENT | Freq: Every day | OPHTHALMIC | 0 refills | Status: AC

## 2023-10-13 NOTE — Progress Notes (Signed)
 BP 112/66   Pulse 70   Temp 97.8 F (36.6 C)   Resp 18   Ht 5' 5 (1.651 m)   Wt 211 lb 9.6 oz (96 kg)   LMP  (LMP Unknown) Comment: rt ovary removed  SpO2 100%   BMI 35.21 kg/m    Subjective:    Patient ID: Allison Phillips, female    DOB: November 23, 1950, 73 y.o.   MRN: [id number]  HPI: Allison Phillips is a 73 y.o. female presenting today with concerns for swelling and redness to right upper eyelid that began two days ago. She endorses redness, swelling, and soreness, especially when blinking. She denies blurry vision but reports her right eye feels scratchy. She has not noticed any discharge from eye. She denies wearing contacts. She has tried warm compresses to eye, which has provided some relief.         09/15/2023   10:57 AM 11/01/2022    8:47 AM 10/25/2022    3:22 PM  Depression screen PHQ 2/9  Decreased Interest 0 0 0  Down, Depressed, Hopeless 0 0 0  PHQ - 2 Score 0 0 0  Altered sleeping 0 0 0  Tired, decreased energy 0 0 0  Change in appetite 0 0 0  Feeling bad or failure about yourself  0 0 0  Trouble concentrating 0 0 0  Moving slowly or fidgety/restless 0 0 0  Suicidal thoughts 0 0 0  PHQ-9 Score 0 0 0  Difficult doing work/chores Not difficult at all Not difficult at all Not difficult at all    Relevant past medical, surgical, family and social history reviewed and updated as indicated. Interim medical history since our last visit reviewed. Allergies and medications reviewed and updated.  Review of Systems  Ten systems reviewed and is negative except as mentioned in HPI       Objective:     BP 112/66   Pulse 70   Temp 97.8 F (36.6 C)   Resp 18   Ht 5' 5 (1.651 m)   Wt 211 lb 9.6 oz (96 kg)   LMP  (LMP Unknown) Comment: rt ovary removed  SpO2 100%   BMI 35.21 kg/m    Wt Readings from Last 3 Encounters:  10/13/23 211 lb 9.6 oz (96 kg)  09/14/23 211 lb (95.7 kg)  05/03/23 217 lb (98.4 kg)    Physical Exam Constitutional:       Appearance: Normal appearance.  HENT:     Head: Normocephalic and atraumatic.  Eyes:     General:        Right eye: No discharge.     Conjunctiva/sclera: Conjunctivae normal.     Comments: Swelling and redness of right upper eyelid   Cardiovascular:     Rate and Rhythm: Normal rate and regular rhythm.     Pulses: Normal pulses.     Heart sounds: Normal heart sounds.  Pulmonary:     Effort: Pulmonary effort is normal.     Breath sounds: Normal breath sounds.  Musculoskeletal:        General: Normal range of motion.     Cervical back: Normal range of motion and neck supple.  Skin:    General: Skin is warm and dry.  Neurological:     General: No focal deficit present.     Mental Status: She is alert and oriented to person, place, and time.  Psychiatric:        Mood and Affect: Mood normal.  Behavior: Behavior normal.        Thought Content: Thought content normal.      Results for orders placed or performed in visit on 05/03/23  Hemoglobin A1c   Collection Time: 04/12/23 12:00 AM  Result Value Ref Range   Hemoglobin A1C 6.6           Assessment & Plan:   Problem List Items Addressed This Visit   None Visit Diagnoses       Blepharitis of right upper eyelid, unspecified type    -  Primary   Continue with compresses to eye. Clean eyelid with baby shampoo. Begin applying erythromycin  ointment to eyelid daily.   Relevant Medications   erythromycin  ophthalmic ointment        Home care: -Begin applying erythromycin  ointment to right eyelid at bedtime -Apply warm compresses to eyelid for 10-15 min, 3-4 times daily -Keep the eyelid clean, wash gently with baby shampoo -Avoid rubbing or squeezing eyelid -Avoid applying makeup          Follow up plan: Return if symptoms worsen or fail to improve.    I have reviewed this encounter including the documentation in this note and/or discussed this patient with the provider, Aislinn Womack, SNP, I am  certifying that I agree with the content of this note as supervising/preceptor nurse practitioner.  Mliss Spray, FNP-C [facility] 10/13/2023, 12:44 PM

## 2023-10-14 ENCOUNTER — Ambulatory Visit: Admitting: Family Medicine

## 2023-10-14 ENCOUNTER — Encounter: Payer: Self-pay | Admitting: Family Medicine

## 2023-10-14 ENCOUNTER — Ambulatory Visit: Payer: Self-pay

## 2023-10-14 VITALS — BP 124/74 | HR 67 | Resp 16 | Ht 65.0 in | Wt 211.6 lb

## 2023-10-14 DIAGNOSIS — H5711 Ocular pain, right eye: Secondary | ICD-10-CM

## 2023-10-14 DIAGNOSIS — H5789 Other specified disorders of eye and adnexa: Secondary | ICD-10-CM | POA: Diagnosis not present

## 2023-10-14 MED ORDER — CEFDINIR 300 MG PO CAPS: 300.0000 mg | ORAL_CAPSULE | Freq: Two times a day (BID) | ORAL | 0 refills | Status: DC

## 2023-10-14 MED ORDER — LORATADINE 10 MG PO TABS
10.0000 mg | ORAL_TABLET | Freq: Two times a day (BID) | ORAL | 0 refills | Status: AC
Start: 2023-10-14 — End: ?

## 2023-10-14 NOTE — Telephone Encounter (Signed)
 Forms have been signed and faxed back for 2nd time.

## 2023-10-14 NOTE — Telephone Encounter (Signed)
 FYI Only or Action Required?: FYI only for provider.  Patient was last seen in primary care on 10/13/2023 by Gareth Mliss FALCON, FNP.  Called Nurse Triage reporting Eye Problem.  Symptoms began several days ago.  Interventions attempted: Prescription medications: erythromycin  eye ointment and Ice/heat application.  Symptoms are: rapidly worsening.  Triage Disposition: See Physician Within 24 Hours  Patient/caregiver understands and will follow disposition?: Yes Seen yesterday for blepharitis, at that time her upper eye lid was mild swollen, pt now endorsing swelling underneath the same eye, concerned for worsening eye infection  Copied from CRM #[id number]. Topic: Clinical - Red Word Triage >> Oct 14, 2023 12:13 PM Lauren C wrote: Red Word that prompted transfer to Nurse Triage: Eye swelling. Pt saw Pender for possible stye on rt eye yesterday and she got a prescription for an antibiotic cream. Now eye is swollen all around eye, not just eye lid. Concerned she has eye infection. Reason for Disposition  MODERATE-SEVERE eyelid swelling on one side  (Exception: Due to a mosquito bite.)  Eyelid is red and painful (or tender to touch)  Answer Assessment - Initial Assessment Questions 1. ONSET: When did the swelling start? (e.g., minutes, hours, days)     Swelling started on  2. LOCATION: What part of the eyelid is swollen?     Eyelid and swelling around the eye  3. SEVERITY: How swollen is it? (e.g., describe; mild, moderate, or severe)     Mild   4. ITCHING: Is there any itching? If Yes, ask: How much?   (Scale 1-10; mild, moderate or severe)     Mild itching  5. PAIN: Is the swelling painful to touch? If Yes, ask: How painful is it?   (Scale 1-10; mild, moderate or severe)     *No Answer* 6. FEVER: Do you have a fever? If Yes, ask: What is it, how was it measured, and when did it start?      Unsure  7. CAUSE: What do you think is causing the swelling?     *No  Answer* 8. RECURRENT SYMPTOM: Have you had eyelid swelling before? If Yes, ask: When was the last time? What happened that time?     *No Answer* 9. OTHER SYMPTOMS: Do you have any other symptoms? (e.g., blurred vision, eye discharge, rash, runny nose)     *No Answer* 10. PREGNANCY: Is there any chance you are pregnant? When was your last menstrual period?       *No Answer*  Protocols used: Eyelid Swelling-A-AH

## 2023-10-14 NOTE — Progress Notes (Signed)
 Name: Allison Phillips   MRN: [id number]    DOB: 03-Nov-1950   Date:10/14/2023       Progress Note  Subjective  Chief Complaint  Chief Complaint  Patient presents with   Eye Problem    Pt applied rx given by Mliss, today woke up even more swollen, red and very itchy    Discussed the use of AI scribe software for clinical note transcription with the patient, who gave verbal consent to proceed.  History of Present Illness Allison Phillips is a 73 year old female who presents with right eye irritation and swelling.  She woke up on Thursday morning with itchiness and soreness in the corner of her right eye. By the evening, the irritation had spread, and by Wednesday morning, the swelling had significantly increased. The area is puffy and sore. She has been applying warm compresses and using erythromycin  ointment, which has helped reduce some of the swelling.  The swelling has impacted her vision, making it difficult to drive, as she has to turn her head to see properly. Her eyelid is not opening completely, affecting her vision. No recent illness, cold, or fever. She reports no new medications.  Her current medications include valsartan  for blood pressure. She has no known drug allergies, except for a reaction to certain types of tape. 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    Patient Active Problem List   Diagnosis Date Noted   Hyperlipidemia 05/03/2023   Gastroesophageal reflux disease 05/03/2023   Centrilobular emphysema (HCC) 04/14/2022   OSA (obstructive sleep apnea) 09/23/2020   Aortic atherosclerosis 11/14/2019   Severe obesity with body mass index (BMI) of 35.0 to 35.9 and comorbidity (HCC) 01/06/2018   Osteopenia 04/20/2017   Type 2 diabetes mellitus without complication, without long-term current use of insulin (HCC) 03/08/2016   Essential hypertension, benign 03/08/2016   Tobacco abuse     Social History   Tobacco Use   Smoking status: Every  Day    Current packs/day: 0.00    Average packs/day: 0.5 packs/day for 15.0 years (7.5 ttl pk-yrs)    Types: Cigarettes    Start date: 10/24/2001    Last attempt to quit: 10/24/2016    Years since quitting: 6.9   Smokeless tobacco: Never   Tobacco comments:    8-10 cigarettes daily- 06/09/2021  Substance Use Topics   Alcohol use: Yes    Alcohol/week: 4.0 standard drinks of alcohol    Types: 4 Standard drinks or equivalent per week    Comment: on occasion     Current Outpatient Medications:    albuterol  (VENTOLIN  HFA) 108 (90 Base) MCG/ACT inhaler, Inhale 2 puffs into the lungs every 4 (four) hours as needed for wheezing or shortness of breath., Disp: 1 each, Rfl: 4   aspirin  EC 81 MG tablet, Take 1 tablet (81 mg total) by mouth daily. Swallow whole., Disp: 100 tablet, Rfl: 1   Cyanocobalamin (B-12) 500 MCG TABS, Take 2,500 mcg by mouth daily. , Disp: , Rfl:    erythromycin  ophthalmic ointment, Place 1 Application into the right eye at bedtime., Disp: 3.5 g, Rfl: 0   Gluc-Chonn-MSM-Boswellia-Vit D (GLUCOSAMINE CHONDROITIN + D3 PO), Take by mouth., Disp: , Rfl:    Multiple Vitamin (MULTIVITAMIN) tablet, Take 1 tablet by mouth daily. Centrum Silver, Disp: , Rfl:    Omega-3 Fatty Acids (FISH OIL PO), Take 1,200 mg daily by mouth. , Disp: , Rfl:    rosuvastatin  (CRESTOR ) 10 MG tablet,  Take 1 tablet (10 mg total) by mouth daily., Disp: 90 tablet, Rfl: 3   Semaglutide ,0.25 or 0.5MG /DOS, (OZEMPIC , 0.25 OR 0.5 MG/DOSE,) 2 MG/3ML SOPN, Inject 0.5 mg into the skin once a week., Disp: 9 mL, Rfl: 1   triamcinolone  (KENALOG ) 0.025 % ointment, Apply 1 Application topically 2 (two) times daily as needed., Disp: 30 g, Rfl: 0   valsartan  (DIOVAN ) 80 MG tablet, Take 1 tablet (80 mg total) by mouth daily., Disp: 90 tablet, Rfl: 1  Allergies  Allergen Reactions   Adhesive [Tape]     Rash/dermatitis   Latex Rash    ROS  Ten systems reviewed and is negative except as mentioned in HPI     Objective  Vitals:   10/14/23 1500  BP: 124/74  Pulse: 67  Resp: 16  Weight: 211 lb 9.6 oz (96 kg)  Height: 5' 5 (1.651 m)    Body mass index is 35.21 kg/m.  Physical Exam CONSTITUTIONAL: Patient appears well-developed and well-nourished. No distress. HEENT: Head atraumatic, normocephalic, neck supple. Conjunctiva normal. Right eye red and inflamed with a bump under the eye. CARDIOVASCULAR: Normal rate, regular rhythm and normal heart sounds. No murmur heard. No BLE edema. PULMONARY: Effort normal and breath sounds normal. No respiratory distress. ABDOMINAL: There is no tenderness or distention. MUSCULOSKELETAL: Normal gait. Without gross motor or sensory deficit. PSYCHIATRIC: Patient has a normal mood and affect. Behavior is normal. Judgment and thought content normal.   Assessment & Plan Right upper eyelid swelling and pain (possible infection or angioedema) Acute swelling and pain in the right upper eyelid, likely due to infection or angioedema. Possible sty noted. Angioedema considered due to valsartan  use. No systemic symptoms present. Antibiotics prescribed to prevent infection spread. - Prescribe Omnicef  for potential infection. - Continue erythromycin  ointment and warm compresses. - Advise Claritin  twice daily and Benadryl at night for potential allergic reaction. - Instruct to rest and avoid travel over the weekend. - Advise emergency room visit if swelling worsens or becomes more tender. - Recommend contacting eye doctor for follow-up on Monday if symptoms persist.  Decreased vision right eye secondary to eyelid swelling Decreased vision in the right eye due to mechanical obstruction from swollen eyelid. Vision test shows 20/70 in the right eye. - Perform vision test to assess current visual acuity. - Advise to contact eye doctor for further evaluation if vision does not improve by Monday.

## 2023-10-20 DIAGNOSIS — H00026 Hordeolum internum left eye, unspecified eyelid: Secondary | ICD-10-CM | POA: Diagnosis not present

## 2023-10-20 DIAGNOSIS — H00023 Hordeolum internum right eye, unspecified eyelid: Secondary | ICD-10-CM | POA: Diagnosis not present

## 2023-11-01 ENCOUNTER — Other Ambulatory Visit: Payer: Self-pay | Admitting: Family Medicine

## 2023-11-01 DIAGNOSIS — E119 Type 2 diabetes mellitus without complications: Secondary | ICD-10-CM

## 2023-11-03 NOTE — Telephone Encounter (Signed)
 Requested medications are due for refill today.  yes  Requested medications are on the active medications list.  yes  Last refill. 05/03/2023 9mL 1 rf  Future visit scheduled.   Yes - next year  Notes to clinic.  Expired labs.    Requested Prescriptions  Pending Prescriptions Disp Refills   OZEMPIC , 0.25 OR 0.5 MG/DOSE, 2 MG/3ML SOPN [Pharmacy Med Name: OZEMPIC  0.25 OR 0.5MG  DOS(2MG /3ML)] 9 mL 1    Sig: INJECT 0.5 MG UNDER THE SKIN ONE DAY A WEEK     Endocrinology:  Diabetes - GLP-1 Receptor Agonists - semaglutide  Failed - 11/03/2023  1:49 PM      Failed - HBA1C in normal range and within 180 days    Hemoglobin A1C  Date Value Ref Range Status  04/12/2023 6.6  Final   Hgb A1c MFr Bld  Date Value Ref Range Status  05/03/2023 5.8 (H) <5.7 % Final    Comment:    For someone without known diabetes, a hemoglobin  A1c value between 5.7% and 6.4% is consistent with prediabetes and should be confirmed with a  follow-up test. . For someone with known diabetes, a value <7% indicates that their diabetes is well controlled. A1c targets should be individualized based on duration of diabetes, age, comorbid conditions, and other considerations. . This assay result is consistent with an increased risk of diabetes. . Currently, no consensus exists regarding use of hemoglobin A1c for diagnosis of diabetes for children. .          Passed - Cr in normal range and within 360 days    Creat  Date Value Ref Range Status  05/03/2023 0.97 0.60 - 1.00 mg/dL Final   Creatinine, Urine  Date Value Ref Range Status  05/03/2023 47 20 - 275 mg/dL Final         Passed - Valid encounter within last 6 months    Recent Outpatient Visits           2 weeks ago Eye pain, right   [facility] Glenard Mire, MD   3 weeks ago Blepharitis of right upper eyelid, unspecified type   [facility] Gareth Mliss FALCON, FNP   6 months ago Essential  hypertension, benign   [facility] Leavy Mole, PA-C

## 2023-11-07 ENCOUNTER — Encounter: Payer: Self-pay | Admitting: Nurse Practitioner

## 2023-11-07 ENCOUNTER — Ambulatory Visit (INDEPENDENT_AMBULATORY_CARE_PROVIDER_SITE_OTHER): Admitting: Nurse Practitioner

## 2023-11-07 VITALS — BP 106/72 | HR 68 | Temp 98.0°F | Ht 65.0 in | Wt 214.2 lb

## 2023-11-07 DIAGNOSIS — F17210 Nicotine dependence, cigarettes, uncomplicated: Secondary | ICD-10-CM

## 2023-11-07 DIAGNOSIS — J432 Centrilobular emphysema: Secondary | ICD-10-CM | POA: Diagnosis not present

## 2023-11-07 DIAGNOSIS — G4733 Obstructive sleep apnea (adult) (pediatric): Secondary | ICD-10-CM | POA: Diagnosis not present

## 2023-11-07 DIAGNOSIS — Z720 Tobacco use: Secondary | ICD-10-CM

## 2023-11-07 NOTE — Assessment & Plan Note (Signed)
 Moderate OSA on CPAP. Excellent compliance and control. Receives benefit from use. Understands risks of untreated OSA and proper care/use of CPAP device. Safe driving practices reviewed. Healthy weight management encouraged.   Patient Instructions  Continue to use CPAP every night, minimum of 4-6 hours a night.  Change equipment as directed. Wash your tubing with warm soap and water daily, hang to dry. Wash humidifier portion weekly. Use bottled, distilled water and change daily Be aware of reduced alertness and do not drive or operate heavy machinery if experiencing this or drowsiness.  Exercise encouraged, as tolerated. Healthy weight management discussed.  Avoid or decrease alcohol consumption and medications that make you more sleepy, if possible. Notify if persistent daytime sleepiness occurs even with consistent use of PAP therapy.  Change CPAP supplies... Every month Mask cushions and/or nasal pillows CPAP machine filters Every 3 months Mask frame (not including the headgear) CPAP tubing Every 6 months Mask headgear Chin strap (if applicable) Humidifier water tub  Continue to monitor breathing  Follow up in one year with Katie Murel Wigle,NP. If symptoms do not improve or worsen, please contact office for sooner follow up or seek emergency care.

## 2023-11-07 NOTE — Assessment & Plan Note (Signed)
 Active smoker. See above. Continue with lung cancer screening program

## 2023-11-07 NOTE — Assessment & Plan Note (Signed)
 Primarily asymptomatic. No use of SABA. No role for maintenance therapy at this time. No recent exacerbations requiring steroids/abx or hospitalizations. Action plan in place. Smoking cessation advised.

## 2023-11-07 NOTE — Progress Notes (Signed)
 @Patient  ID: Allison Phillips, female    DOB: June 20, 1950, 73 y.o.   MRN: [id number]  Chief Complaint  Patient presents with   Obstructive Sleep Apnea    Wearing CPAP nightly. No problems with mask or pressure.     Referring provider: Leavy Mole, PA-C  HPI: 73 year old female, active smoker followed for OSA on CPAP and emphysema. She is a former patient of Dr. Magdaleno and last seen in office 06/02/2022. Past medical history significant for HTN, GERD, DM, obesity, HLD.  TEST/EVENTS:  09/04/2020 HST: AHI 16.9/h, SpO2 low 90% 03/14/2023 LDCT chest: atherosclerosis. Emphysema. Bronchiolitis. Scattered scarring. Left hepatic lobe cyst. Lung RADS 1  06/02/2022: OV with Dr. Shellia. Had bronchitis earlier this month with green sputum and congestion; started on z pack and improving. Hasn't needed to use albuterol . Using mucinex some. Still using CPAP nightly. Paused when she had bronchitis.   11/07/2023: Today - follow up Discussed the use of AI scribe software for clinical note transcription with the patient, who gave verbal consent to proceed.  History of Present Illness Allison Phillips is a 73 year old female with OSA who presents for a follow-up visit.  She recently experienced a cold, which is improving but still causing some congestion. She started taking Mucinex today which is already making her feel better. No fevers, chills, or hemoptysis.   She uses her CPAP every night. Took a brief break 2 days when she first got sick but back on therapy. Notices that her sleep is not as restful and energy level declines when not using CPAP. She has good energy levels during the day and no issues with drowsy driving.  She has a history of emphysema from years of smoking but does not use a daily inhaler and has not needed albuterol  recently. Breathing overall feels well.  10/03/2023-11/01/2023: CPAP 5-8 28/30 days; 90% >4 hr; average use 7 hr Avg pressure 5.8 Avg leaks 1.2 AHI 1.1    Allergies   Allergen Reactions   Adhesive [Tape]     Rash/dermatitis   Latex Rash    Immunization History  Administered Date(s) Administered   Fluad Quad(high Dose 65+) 10/19/2018, 10/08/2019, 10/08/2020   INFLUENZA, HIGH DOSE SEASONAL PF 11/14/2017, 10/15/2021   Moderna Covid-19 Vaccine Bivalent Booster 81yrs & up 10/27/2020   Moderna SARS-COV2 Booster Vaccination 10/26/2021   Moderna Sars-Covid-2 Vaccination 03/02/2019, 04/02/2019, 11/13/2019, 04/28/2020   Pneumococcal Conjugate-13 12/06/2016   Pneumococcal Polysaccharide-23 11/02/2010, 08/28/2018, 01/19/2019   RSV,unspecified 12/21/2021   Td 10/23/2007   Zoster Recombinant(Shingrix) 10/15/2021, 12/21/2021   Zoster, Live 11/16/2011    Past Medical History:  Diagnosis Date   Bursitis of left hip    Chronic kidney disease    Depression    Depression    Diverticulitis    Emphysema lung (HCC)    GERD (gastroesophageal reflux disease)    Hepatic abscess 01/18/2019   Hip pain, chronic, left 03/18/2017   Hypertension    Joint pain    Lower back pain 03/18/2017   Obesity    OSA (obstructive sleep apnea)    Osteopenia 04/20/2017   April 2019; next DEXA April 2021   Osteoporosis    Pre-diabetes    Prediabetes    Reflux    Sacroiliac joint pain 03/18/2017   Tobacco use     Tobacco History: Social History   Tobacco Use  Smoking Status Every Day   Current packs/day: 0.01   Average packs/day: 0.4 packs/day for 21.1 years (7.6 ttl pk-yrs)  Types: Cigarettes   Start date: 10/24/2001   Last attempt to quit: 10/24/2016  Smokeless Tobacco Never  Tobacco Comments   8-10 cigarettes daily- 06/09/2021   Ready to quit: Not Answered Counseling given: Not Answered Tobacco comments: 8-10 cigarettes daily- 06/09/2021   Outpatient Medications Prior to Visit  Medication Sig Dispense Refill   albuterol  (VENTOLIN  HFA) 108 (90 Base) MCG/ACT inhaler Inhale 2 puffs into the lungs every 4 (four) hours as needed for wheezing or shortness of  breath. 1 each 4   aspirin  EC 81 MG tablet Take 1 tablet (81 mg total) by mouth daily. Swallow whole. 100 tablet 1   Cyanocobalamin (B-12) 500 MCG TABS Take 2,500 mcg by mouth daily.      erythromycin  ophthalmic ointment Place 1 Application into the right eye at bedtime. 3.5 g 0   Gluc-Chonn-MSM-Boswellia-Vit D (GLUCOSAMINE CHONDROITIN + D3 PO) Take by mouth.     loratadine  (CLARITIN ) 10 MG tablet Take 1 tablet (10 mg total) by mouth 2 (two) times daily. 60 tablet 0   Multiple Vitamin (MULTIVITAMIN) tablet Take 1 tablet by mouth daily. Centrum Silver     Omega-3 Fatty Acids (FISH OIL PO) Take 1,200 mg daily by mouth.      OZEMPIC , 0.25 OR 0.5 MG/DOSE, 2 MG/3ML SOPN INJECT 0.5 MG UNDER THE SKIN ONE DAY A WEEK 9 mL 1   rosuvastatin  (CRESTOR ) 10 MG tablet Take 1 tablet (10 mg total) by mouth daily. 90 tablet 3   triamcinolone  (KENALOG ) 0.025 % ointment Apply 1 Application topically 2 (two) times daily as needed. 30 g 0   valsartan  (DIOVAN ) 80 MG tablet Take 1 tablet (80 mg total) by mouth daily. 90 tablet 1   cefdinir  (OMNICEF ) 300 MG capsule Take 1 capsule (300 mg total) by mouth 2 (two) times daily. (Patient not taking: Reported on 11/07/2023) 14 capsule 0   No facility-administered medications prior to visit.     Review of Systems: as above    Physical Exam:  BP 106/72   Pulse 68   Temp 98 F (36.7 C) (Temporal)   Ht 5' 5 (1.651 m)   Wt 214 lb 3.2 oz (97.2 kg)   LMP  (LMP Unknown) Comment: rt ovary removed  SpO2 100%   BMI 35.64 kg/m   GEN: Pleasant, interactive, well-appearing; obese; in no acute distress HEENT:  Normocephalic and atraumatic. PERRLA. Sclera white. Nasal turbinates pink, moist and patent bilaterally. No rhinorrhea present. Oropharynx pink and moist, without exudate or edema. No lesions, ulcerations, or postnasal drip.  NECK:  Supple w/ fair ROM. No lymphadenopathy.   CV: RRR, no m/r/g PULMONARY:  Unlabored, regular breathing. Clear bilaterally A&P w/o  wheezes/rales/rhonchi. No accessory muscle use.  GI: BS present and normoactive. Soft, non-tender to palpation.  Neuro: A/Ox3. No focal deficits noted.   Skin: Warm, no lesions or rashe Psych: Normal affect and behavior. Judgement and thought content appropriate.     Lab Results:  CBC    Component Value Date/Time   WBC 6.8 05/03/2023 0936   RBC 4.54 05/03/2023 0936   HGB 13.1 05/03/2023 0936   HGB 12.8 11/26/2015 1429   HCT 40.0 05/03/2023 0936   HCT 38.5 11/26/2015 1429   PLT 296 05/03/2023 0936   PLT 307 11/26/2015 1429   MCV 88.1 05/03/2023 0936   MCV 84 11/26/2015 1429   MCH 28.9 05/03/2023 0936   MCHC 32.8 05/03/2023 0936   RDW 13.0 05/03/2023 0936   RDW 14.6 11/26/2015 1429   LYMPHSABS  3,053 10/25/2022 1610   LYMPHSABS 2.8 11/26/2015 1429   MONOABS 0.8 01/23/2019 0643   EOSABS 388 05/03/2023 0936   EOSABS 0.2 11/26/2015 1429   BASOSABS 68 05/03/2023 0936   BASOSABS 0.1 11/26/2015 1429    BMET    Component Value Date/Time   NA 139 05/03/2023 0936   NA 142 04/27/2016 1612   K 5.0 05/03/2023 0936   CL 103 05/03/2023 0936   CO2 30 05/03/2023 0936   GLUCOSE 60 (L) 05/03/2023 0936   BUN 16 05/03/2023 0936   BUN 20 04/27/2016 1612   CREATININE 0.97 05/03/2023 0936   CALCIUM  9.7 05/03/2023 0936   GFRNONAA 67 11/14/2019 1218   GFRAA 77 11/14/2019 1218    BNP No results found for: BNP   Imaging:  No results found.  Administration History     None           No data to display          No results found for: NITRICOXIDE      Assessment & Plan:   OSA (obstructive sleep apnea) Moderate OSA on CPAP. Excellent compliance and control. Receives benefit from use. Understands risks of untreated OSA and proper care/use of CPAP device. Safe driving practices reviewed. Healthy weight management encouraged.   Patient Instructions  Continue to use CPAP every night, minimum of 4-6 hours a night.  Change equipment as directed. Wash your tubing  with warm soap and water daily, hang to dry. Wash humidifier portion weekly. Use bottled, distilled water and change daily Be aware of reduced alertness and do not drive or operate heavy machinery if experiencing this or drowsiness.  Exercise encouraged, as tolerated. Healthy weight management discussed.  Avoid or decrease alcohol consumption and medications that make you more sleepy, if possible. Notify if persistent daytime sleepiness occurs even with consistent use of PAP therapy.  Change CPAP supplies... Every month Mask cushions and/or nasal pillows CPAP machine filters Every 3 months Mask frame (not including the headgear) CPAP tubing Every 6 months Mask headgear Chin strap (if applicable) Humidifier water tub  Continue to monitor breathing  Follow up in one year with Katie Drea Jurewicz,NP. If symptoms do not improve or worsen, please contact office for sooner follow up or seek emergency care.    Centrilobular emphysema (HCC) Primarily asymptomatic. No use of SABA. No role for maintenance therapy at this time. No recent exacerbations requiring steroids/abx or hospitalizations. Action plan in place. Smoking cessation advised.   Tobacco abuse Active smoker. See above. Continue with lung cancer screening program   Advised if symptoms do not improve or worsen, to please contact office for sooner follow up or seek emergency care.   I spent 25 minutes of dedicated to the care of this patient on the date of this encounter to include pre-visit review of records, face-to-face time with the patient discussing conditions above, post visit ordering of testing, clinical documentation with the electronic health record, making appropriate referrals as documented, and communicating necessary findings to members of the patients care team.  Comer LULLA Rouleau, NP 11/07/2023  Pt aware and understands NP's role.

## 2023-11-07 NOTE — Patient Instructions (Addendum)
 Continue to use CPAP every night, minimum of 4-6 hours a night.  Change equipment as directed. Wash your tubing with warm soap and water daily, hang to dry. Wash humidifier portion weekly. Use bottled, distilled water and change daily Be aware of reduced alertness and do not drive or operate heavy machinery if experiencing this or drowsiness.  Exercise encouraged, as tolerated. Healthy weight management discussed.  Avoid or decrease alcohol consumption and medications that make you more sleepy, if possible. Notify if persistent daytime sleepiness occurs even with consistent use of PAP therapy.  Change CPAP supplies... Every month Mask cushions and/or nasal pillows CPAP machine filters Every 3 months Mask frame (not including the headgear) CPAP tubing Every 6 months Mask headgear Chin strap (if applicable) Humidifier water tub  Continue to monitor breathing  Follow up in one year with Allison Shaleta Ruacho,NP. If symptoms do not improve or worsen, please contact office for sooner follow up or seek emergency care.

## 2023-12-02 ENCOUNTER — Ambulatory Visit
Admission: RE | Admit: 2023-12-02 | Discharge: 2023-12-02 | Disposition: A | Discharge status: Home / Self Care | Source: Ambulatory Visit | Attending: Nurse Practitioner | Admitting: Nurse Practitioner

## 2023-12-02 ENCOUNTER — Encounter: Payer: Self-pay | Admitting: Nurse Practitioner

## 2023-12-02 ENCOUNTER — Ambulatory Visit (INDEPENDENT_AMBULATORY_CARE_PROVIDER_SITE_OTHER): Admitting: Nurse Practitioner

## 2023-12-02 ENCOUNTER — Ambulatory Visit: Payer: Self-pay

## 2023-12-02 VITALS — BP 138/80 | HR 57 | Temp 98.0°F | Ht 65.0 in | Wt 214.0 lb

## 2023-12-02 DIAGNOSIS — M79662 Pain in left lower leg: Secondary | ICD-10-CM | POA: Insufficient documentation

## 2023-12-02 NOTE — Progress Notes (Signed)
 BP (!) 140/92   Pulse (!) 57   Temp 98 F (36.7 C)   Ht 5' 5 (1.651 m)   Wt 214 lb (97.1 kg)   LMP  (LMP Unknown) Comment: rt ovary removed  SpO2 99%   BMI 35.61 kg/m    Subjective:    Patient ID: Allison Phillips, female    DOB: July 28, 1950, 73 y.o.   MRN: [id number]  HPI: Allison Phillips is a 73 y.o. female  Chief Complaint  Patient presents with   Leg Pain    Pt c/o left lower leg pain after hitting leg on truck.    Discussed the use of AI scribe software for clinical note transcription with the patient, who gave verbal consent to proceed.  History of Present Illness Allison Phillips is a 73 year old female who presents with leg pain following a fall.  Lower extremity pain - Leg pain following a fall nearly three weeks ago - Pain occurred after slipping on a wet running board while entering a truck - Hip went out from under her, causing her to smash her leg against the truck without hitting the ground - Pain is localized to the leg and does not radiate - No visible bruising - Resting provides some relief - ibuprofen for pain  -calf tenderness on exam         12/02/2023    1:57 PM 10/14/2023    3:00 PM 09/15/2023   10:57 AM  Depression screen PHQ 2/9  Decreased Interest 0 0 0  Down, Depressed, Hopeless 0 0 0  PHQ - 2 Score 0 0 0  Altered sleeping 0 0 0  Tired, decreased energy 0 0 0  Change in appetite 0 0 0  Feeling bad or failure about yourself  0 0 0  Trouble concentrating 0 0 0  Moving slowly or fidgety/restless 0 0 0  Suicidal thoughts 0 0 0  PHQ-9 Score 0 0  0   Difficult doing work/chores  Not difficult at all Not difficult at all     Data saved with a previous flowsheet row definition    Relevant past medical, surgical, family and social history reviewed and updated as indicated. Interim medical history since our last visit reviewed. Allergies and medications reviewed and updated.  Review of Systems  Ten systems reviewed and is negative  except as mentioned in HPI      Objective:      BP (!) 140/92   Pulse (!) 57   Temp 98 F (36.7 C)   Ht 5' 5 (1.651 m)   Wt 214 lb (97.1 kg)   LMP  (LMP Unknown) Comment: rt ovary removed  SpO2 99%   BMI 35.61 kg/m    Wt Readings from Last 3 Encounters:  12/02/23 214 lb (97.1 kg)  11/07/23 214 lb 3.2 oz (97.2 kg)  10/14/23 211 lb 9.6 oz (96 kg)    Physical Exam GENERAL: Alert, cooperative, well developed, no acute distress. HEENT: Normocephalic, normal oropharynx, moist mucous membranes. CHEST: Clear to auscultation bilaterally, no wheezes, rhonchi, or crackles. CARDIOVASCULAR: Normal heart rate and rhythm, S1 and S2 normal without murmurs. ABDOMEN: Soft, non-tender, non-distended, without organomegaly, normal bowel sounds. EXTREMITIES: No cyanosis or edema. Left lower leg pain Positive homan's sign MUSCULOSKELETAL: Calf tenderness and slight soreness on palpation of the leg. NEUROLOGICAL: Cranial nerves grossly intact, moves all extremities without gross motor or sensory deficit.  Results for orders placed or performed in visit on 05/03/23  Hemoglobin A1c   Collection Time: 04/12/23 12:00 AM  Result Value Ref Range   Hemoglobin A1C 6.6           Assessment & Plan:   Problem List Items Addressed This Visit   None Visit Diagnoses       Pain of left calf    -  Primary   Relevant Orders   US  Venous Img Lower Unilateral Left        Assessment and Plan Assessment & Plan Leg pain, possible deep muscle contusion Leg pain persisting for over two weeks following a slip and impact against a truck. Pain is localized and not associated with visible bruising, suggesting a possible deep muscle contusion. Pain is not exacerbated by palpation, indicating a non-superficial injury. Positive homan's sign - Ordered ultrasound to rule out deep vein thrombosis (DVT)  Rule out deep vein thrombosis (DVT) of leg Calf tenderness raises concern for possible DVT. Differential  diagnosis includes deep muscle contusion versus DVT. Ultrasound is necessary to confirm or exclude DVT. - Ordered ultrasound to rule out DVT        Follow up plan: Return if symptoms worsen or fail to improve.

## 2023-12-19 ENCOUNTER — Telehealth: Payer: Self-pay

## 2023-12-19 DIAGNOSIS — I10 Essential (primary) hypertension: Secondary | ICD-10-CM

## 2023-12-19 MED ORDER — VALSARTAN 80 MG PO TABS: 80.0000 mg | ORAL_TABLET | Freq: Every day | ORAL | 0 refills | Status: AC

## 2023-12-19 NOTE — Telephone Encounter (Signed)
 She needs to schedule reg f/u

## 2023-12-19 NOTE — Telephone Encounter (Signed)
 Refill request on  valsartan  (DIOVAN ) 80 MG tablet

## 2024-01-30 ENCOUNTER — Ambulatory Visit: Admission: RE | Admit: 2024-01-30 | Source: Ambulatory Visit

## 2024-03-16 ENCOUNTER — Ambulatory Visit

## 2024-09-20 ENCOUNTER — Ambulatory Visit
